# Patient Record
Sex: Male | Born: 1965 | Race: Black or African American | Hispanic: No | Marital: Married | Smoking: Never smoker
Health system: Southern US, Community
[De-identification: ages and names within clinical notes are randomized; demographics above are authoritative.]

## PROBLEM LIST (undated history)

## (undated) DIAGNOSIS — F1010 Alcohol abuse, uncomplicated: Secondary | ICD-10-CM

## (undated) DIAGNOSIS — N19 Unspecified kidney failure: Secondary | ICD-10-CM

## (undated) DIAGNOSIS — E210 Primary hyperparathyroidism: Secondary | ICD-10-CM

## (undated) DIAGNOSIS — I719 Aortic aneurysm of unspecified site, without rupture: Secondary | ICD-10-CM

## (undated) DIAGNOSIS — E041 Nontoxic single thyroid nodule: Secondary | ICD-10-CM

## (undated) DIAGNOSIS — I10 Essential (primary) hypertension: Secondary | ICD-10-CM

## (undated) HISTORY — PX: CARDIAC SURGERY: SHX584

## (undated) HISTORY — DX: Essential (primary) hypertension: I10

## (undated) HISTORY — DX: Aortic aneurysm of unspecified site, without rupture: I71.9

## (undated) HISTORY — PX: THROAT SURGERY: SHX803

---

## 2008-03-20 DIAGNOSIS — I719 Aortic aneurysm of unspecified site, without rupture: Secondary | ICD-10-CM

## 2008-03-20 HISTORY — DX: Aortic aneurysm of unspecified site, without rupture: I71.9

## 2008-06-18 ENCOUNTER — Encounter: Admission: RE | Admit: 2008-06-18 | Discharge: 2008-06-18 | Payer: Self-pay | Admitting: Internal Medicine

## 2008-07-02 ENCOUNTER — Encounter: Admission: RE | Admit: 2008-07-02 | Discharge: 2008-07-02 | Payer: Self-pay | Admitting: Internal Medicine

## 2008-07-02 ENCOUNTER — Other Ambulatory Visit: Admission: RE | Admit: 2008-07-02 | Discharge: 2008-07-02 | Payer: Self-pay | Admitting: Interventional Radiology

## 2008-07-02 ENCOUNTER — Encounter (INDEPENDENT_AMBULATORY_CARE_PROVIDER_SITE_OTHER): Payer: Self-pay | Admitting: Interventional Radiology

## 2008-07-29 ENCOUNTER — Encounter (HOSPITAL_COMMUNITY): Admission: RE | Admit: 2008-07-29 | Discharge: 2008-10-21 | Payer: Self-pay | Admitting: Internal Medicine

## 2008-08-18 ENCOUNTER — Encounter (INDEPENDENT_AMBULATORY_CARE_PROVIDER_SITE_OTHER): Payer: Self-pay | Admitting: Interventional Radiology

## 2008-08-18 ENCOUNTER — Other Ambulatory Visit: Admission: RE | Admit: 2008-08-18 | Discharge: 2008-08-18 | Payer: Self-pay | Admitting: Interventional Radiology

## 2008-08-18 ENCOUNTER — Encounter: Admission: RE | Admit: 2008-08-18 | Discharge: 2008-08-18 | Payer: Self-pay | Admitting: Internal Medicine

## 2008-10-26 ENCOUNTER — Encounter: Admission: RE | Admit: 2008-10-26 | Discharge: 2008-10-26 | Payer: Self-pay | Admitting: Surgery

## 2008-10-30 ENCOUNTER — Encounter: Admission: RE | Admit: 2008-10-30 | Discharge: 2008-10-30 | Payer: Self-pay | Admitting: Surgery

## 2008-11-03 ENCOUNTER — Ambulatory Visit: Payer: Self-pay | Admitting: Surgery

## 2008-11-05 ENCOUNTER — Encounter: Payer: Self-pay | Admitting: Surgery

## 2008-11-09 ENCOUNTER — Ambulatory Visit: Payer: Self-pay | Admitting: Surgery

## 2008-11-12 ENCOUNTER — Inpatient Hospital Stay (HOSPITAL_COMMUNITY): Admission: RE | Admit: 2008-11-12 | Discharge: 2008-11-19 | Payer: Self-pay | Admitting: Surgery

## 2008-11-12 ENCOUNTER — Encounter: Payer: Self-pay | Admitting: Surgery

## 2008-11-12 ENCOUNTER — Ambulatory Visit: Payer: Self-pay | Admitting: Surgery

## 2008-11-18 ENCOUNTER — Encounter: Payer: Self-pay | Admitting: Internal Medicine

## 2008-11-30 ENCOUNTER — Emergency Department (HOSPITAL_COMMUNITY): Admission: EM | Admit: 2008-11-30 | Discharge: 2008-11-30 | Payer: Self-pay | Admitting: Emergency Medicine

## 2008-12-07 ENCOUNTER — Ambulatory Visit: Payer: Self-pay | Admitting: Thoracic Surgery (Cardiothoracic Vascular Surgery)

## 2008-12-07 ENCOUNTER — Encounter
Admission: RE | Admit: 2008-12-07 | Discharge: 2008-12-07 | Payer: Self-pay | Admitting: Thoracic Surgery (Cardiothoracic Vascular Surgery)

## 2008-12-15 ENCOUNTER — Ambulatory Visit: Payer: Self-pay | Admitting: Surgery

## 2008-12-15 ENCOUNTER — Encounter: Admission: RE | Admit: 2008-12-15 | Discharge: 2008-12-15 | Payer: Self-pay | Admitting: Surgery

## 2009-01-12 ENCOUNTER — Encounter: Admission: RE | Admit: 2009-01-12 | Discharge: 2009-01-12 | Payer: Self-pay | Admitting: Surgery

## 2009-01-15 ENCOUNTER — Ambulatory Visit: Payer: Self-pay | Admitting: Surgery

## 2009-03-08 ENCOUNTER — Encounter (INDEPENDENT_AMBULATORY_CARE_PROVIDER_SITE_OTHER): Payer: Self-pay | Admitting: Otolaryngology

## 2009-03-08 ENCOUNTER — Ambulatory Visit (HOSPITAL_COMMUNITY): Admission: RE | Admit: 2009-03-08 | Discharge: 2009-03-09 | Payer: Self-pay | Admitting: Otolaryngology

## 2010-03-25 IMAGING — CR DG CHEST 1V PORT
1 series · 1 of 1 positions shown · non-contrast
Comparison: 11/13/2008

CLINICAL DATA: Chest tubes, ascending aortic aneurysm and
dissection.

PORTABLE CHEST - 1 VIEW

[view not recorded]
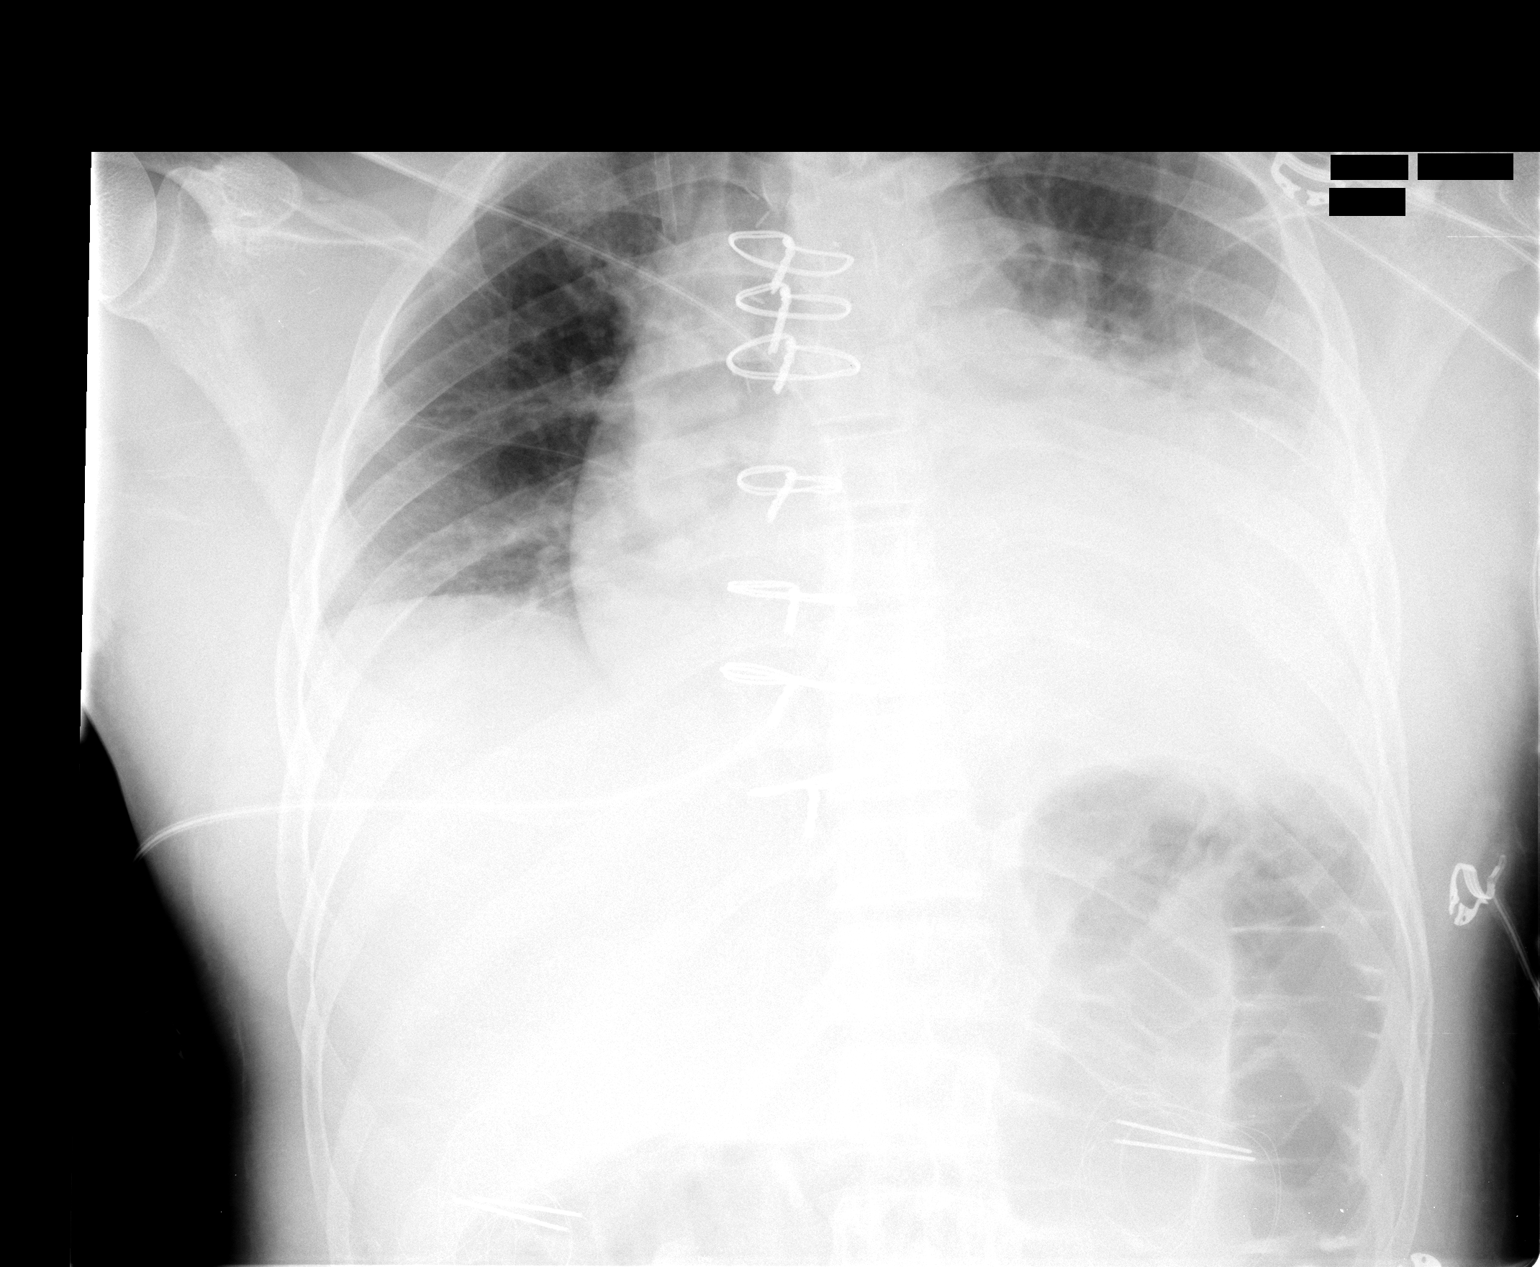

[1 of 1 positions shown; findings below may reference images not displayed]

FINDINGS: Trachea is midline.  Heart is enlarged.  Right IJ
catheter sheath is in place.  Sternotomy wires are unchanged in
position.

Lingular and left lower lobe air space consolidation is new.  Lungs
are low in volume.  Mild bibasilar atelectasis. There may be a left
pleural effusion.
IMPRESSION: 1.  New lingular and left lower lobe air space consolidation.
There may be a left pleural effusion as well.
2.  Minimal right basilar atelectasis.

## 2010-03-28 IMAGING — CR DG CHEST 2V
2 series · 2 of 2 positions shown · non-contrast
Comparison: [HOSPITAL] at [REDACTED] [HOSPITAL] chest CT
10/30/2008, [HOSPITAL] chest x-rays 11/10/2008,
11/12/2008, 11/13/2008, 11/14/2008, and 11/16/2008.

CLINICAL DATA: Ascending aortic aneurysm and dissection.  RT[place]OTA.

CHEST - 2 VIEW

[w chest pa]
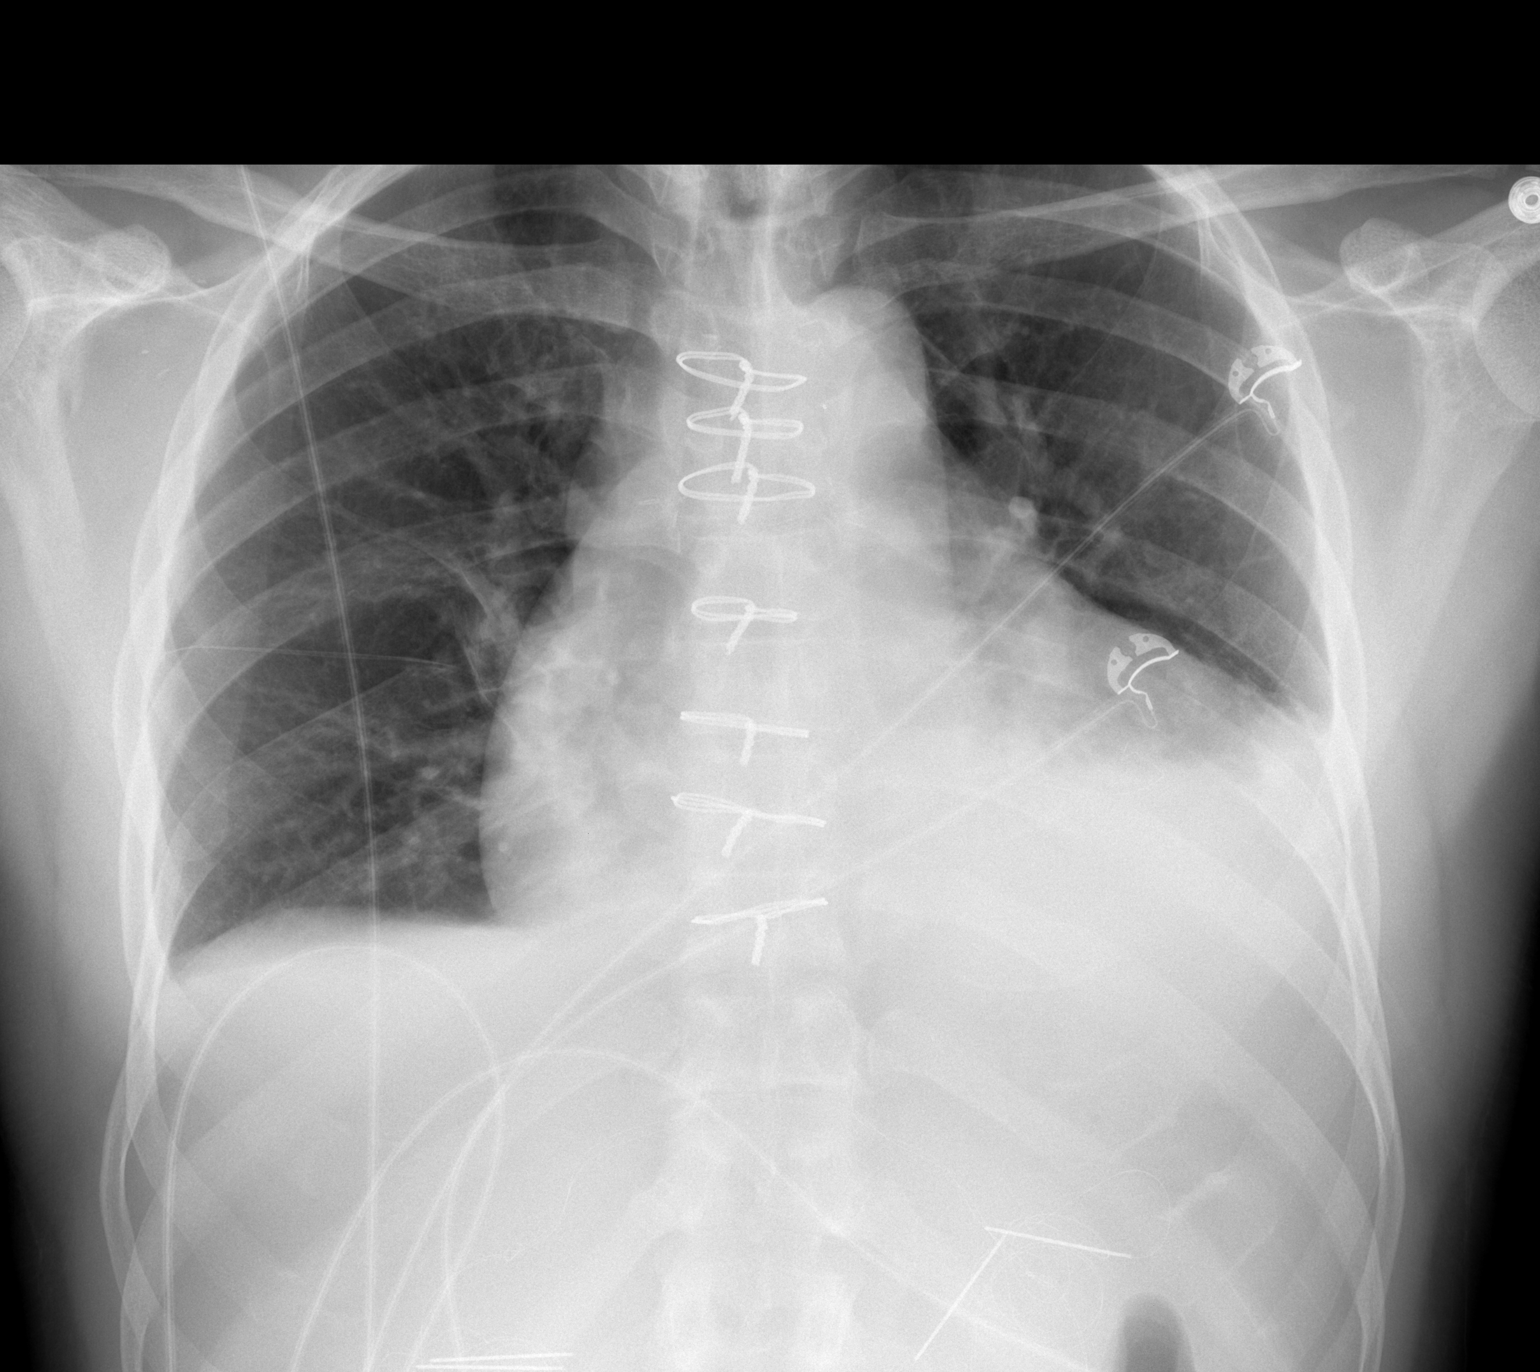

[w chest lat]
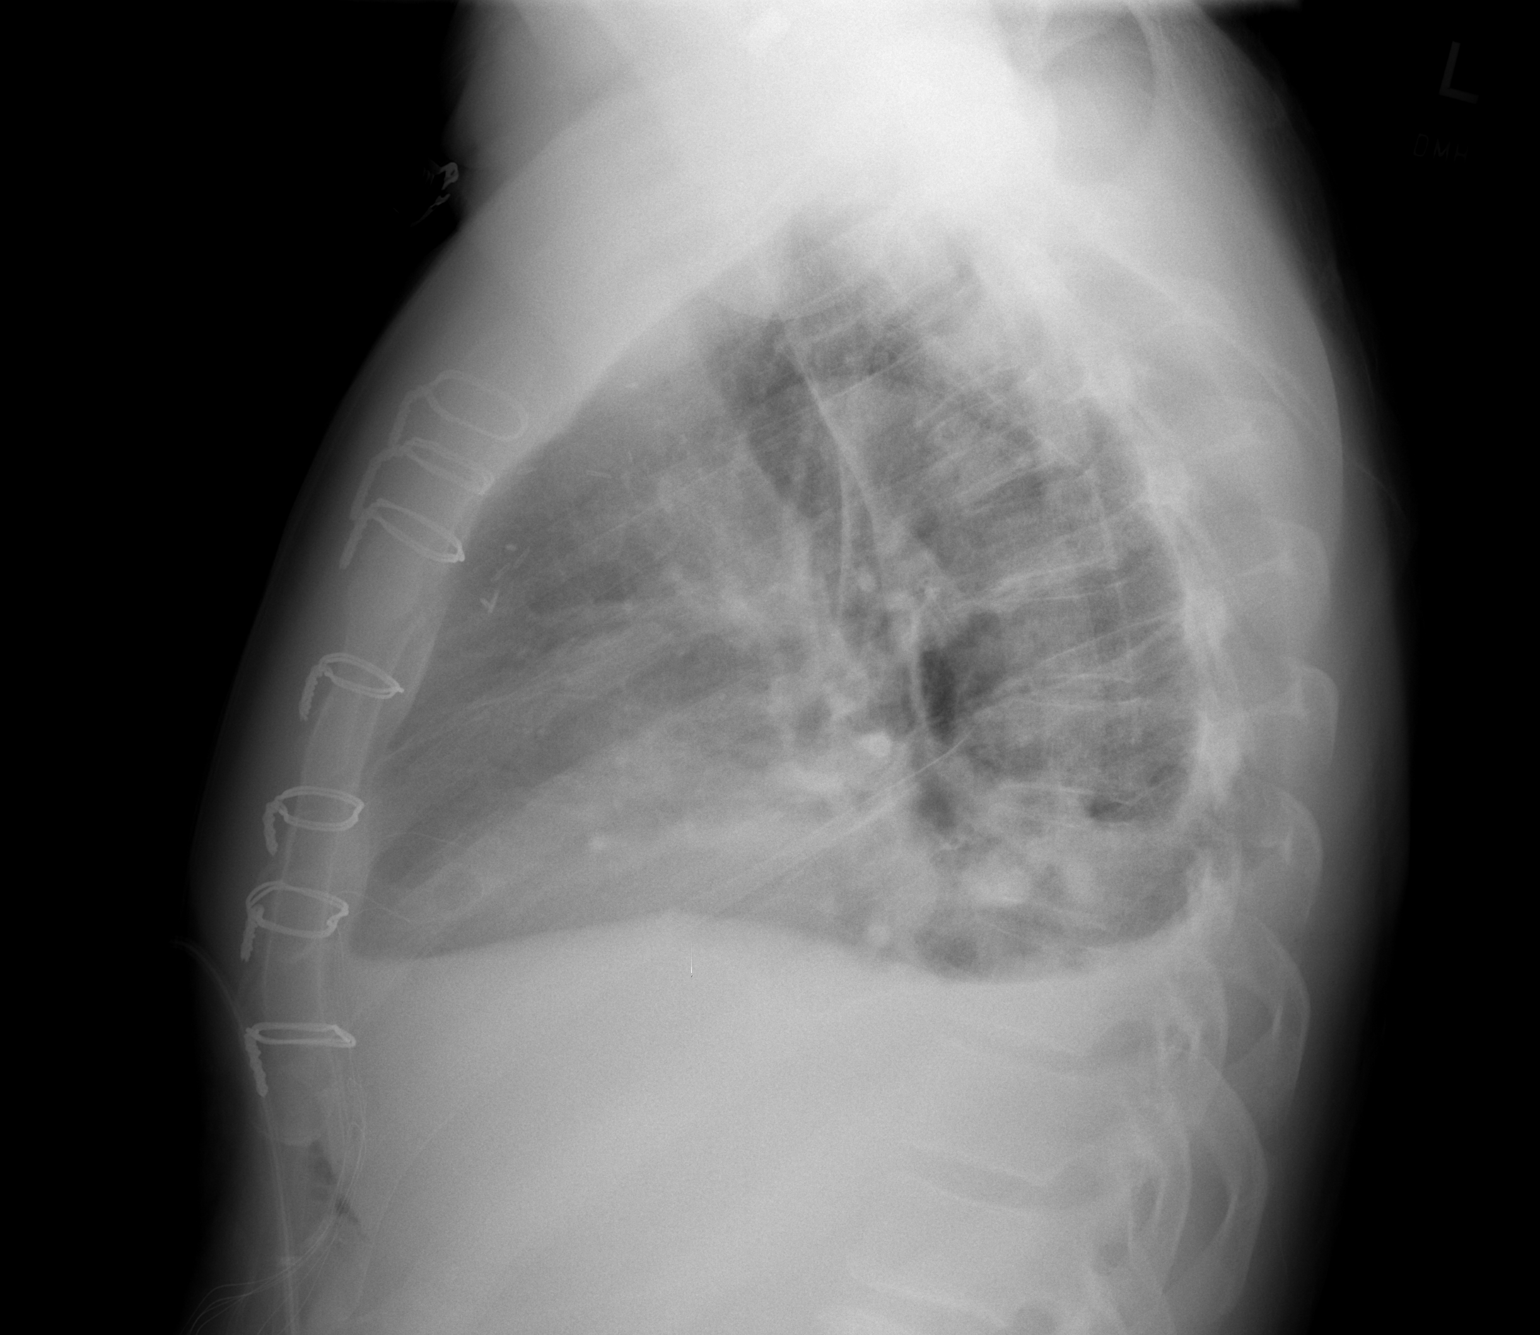

[2 of 2 positions shown; findings below may reference images not displayed]

FINDINGS: Interval removal right internal jugular central venous
sheath without pneumothorax seen.  Greater aeration is seen since
11/16/2008 as well.  No change in small left and tiny right pleural
effusions with left basilar atelectatic/infiltrative change.  Upper
lungs are otherwise clear.  Stable cardiomegaly post sternal wire
sutures visualized.
IMPRESSION: 1.  Small left with tiny right pleural effusions.
2.  Persistent left basilar atelectatic/infiltrative change.
3.  Stable cardiomegaly and sternotomy.
4.  No interval acute findings or pneumothorax.  Interval removal
right internal jugular central venous sheath.

## 2010-04-10 ENCOUNTER — Encounter: Payer: Self-pay | Admitting: Internal Medicine

## 2010-04-17 IMAGING — CR DG CHEST 2V
2 series · 2 of 2 positions shown · non-contrast
Comparison: 11/18/2008. The

CLINICAL DATA: Post repair of thoracic aneurysm.

CHEST - 2 VIEW

[w chest pa]
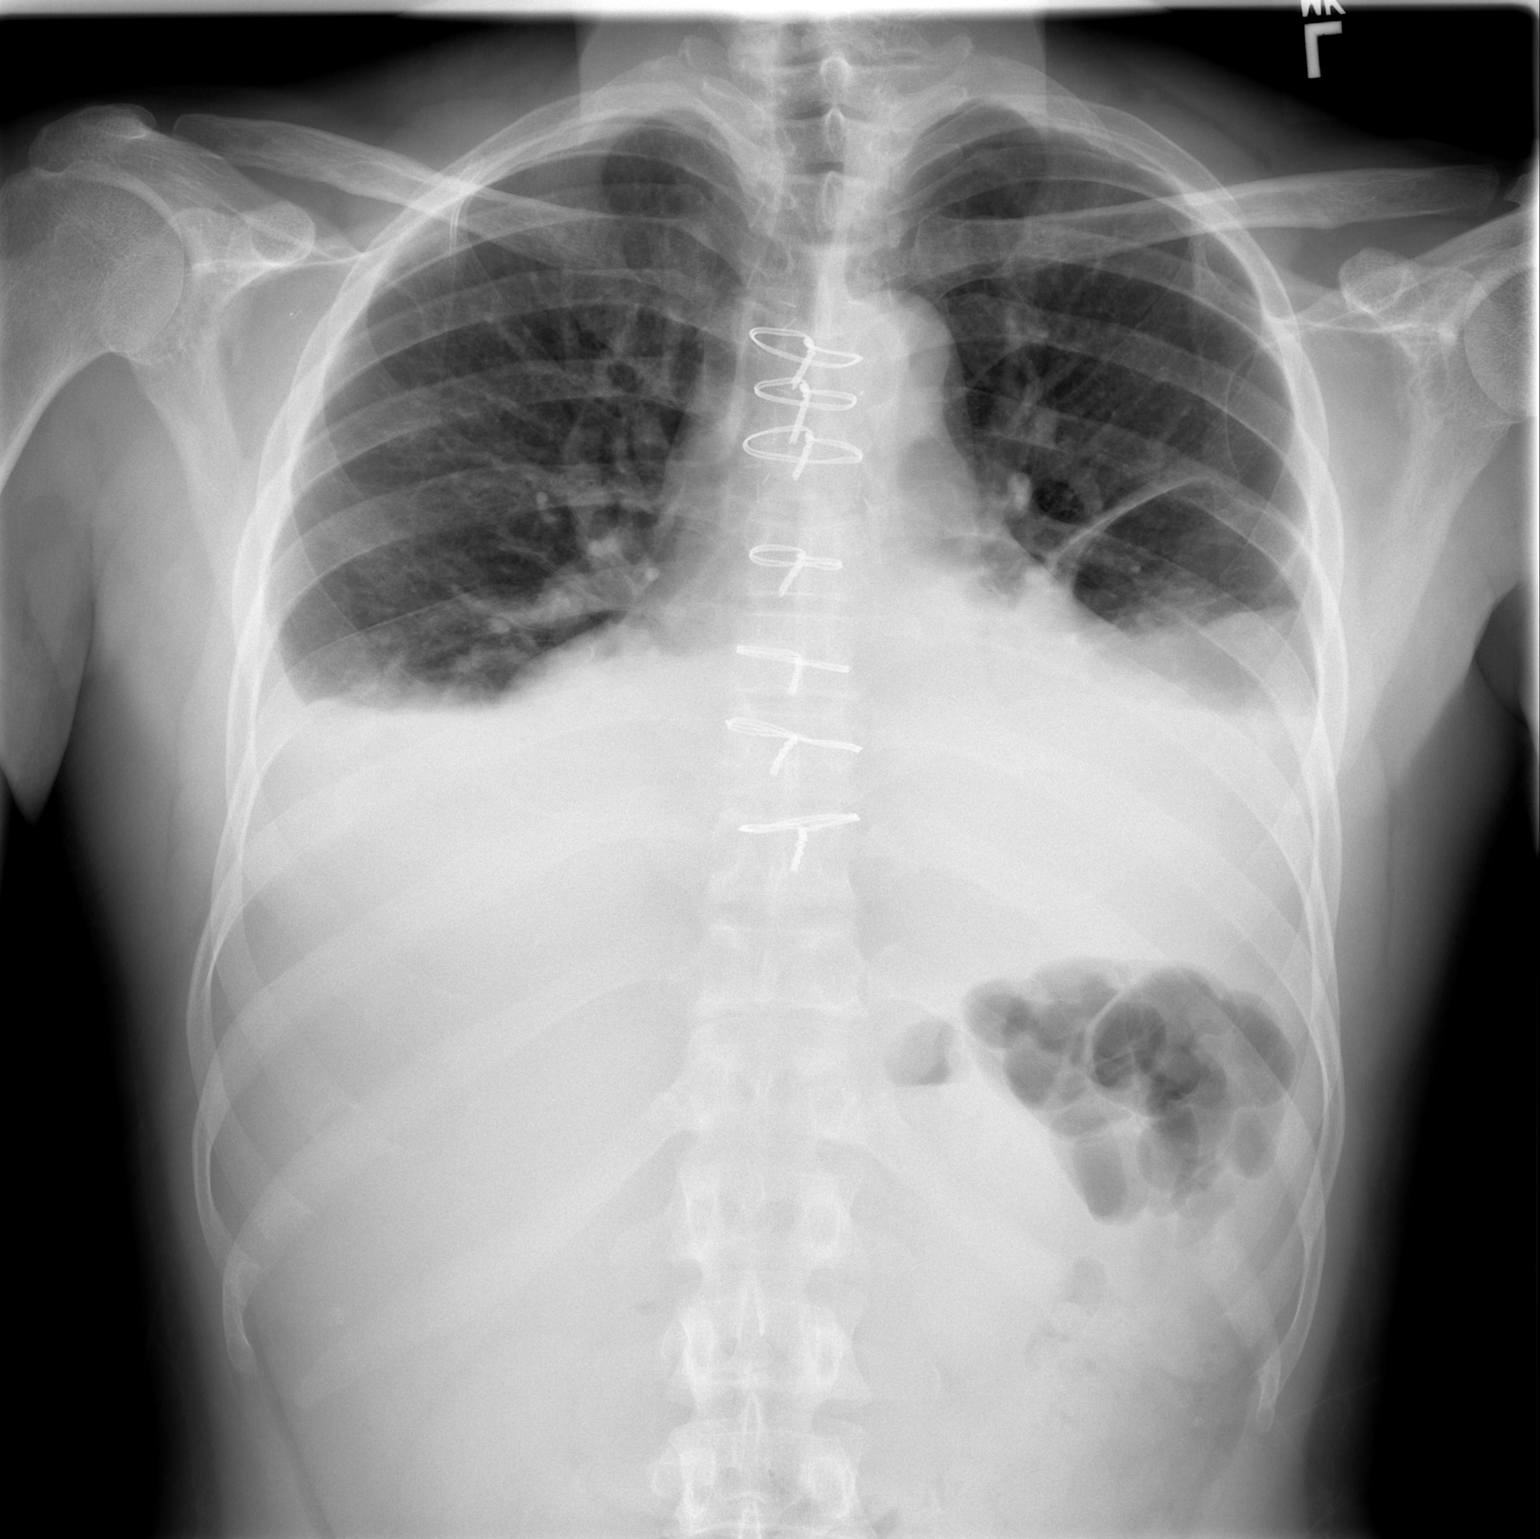

[w chest lat]
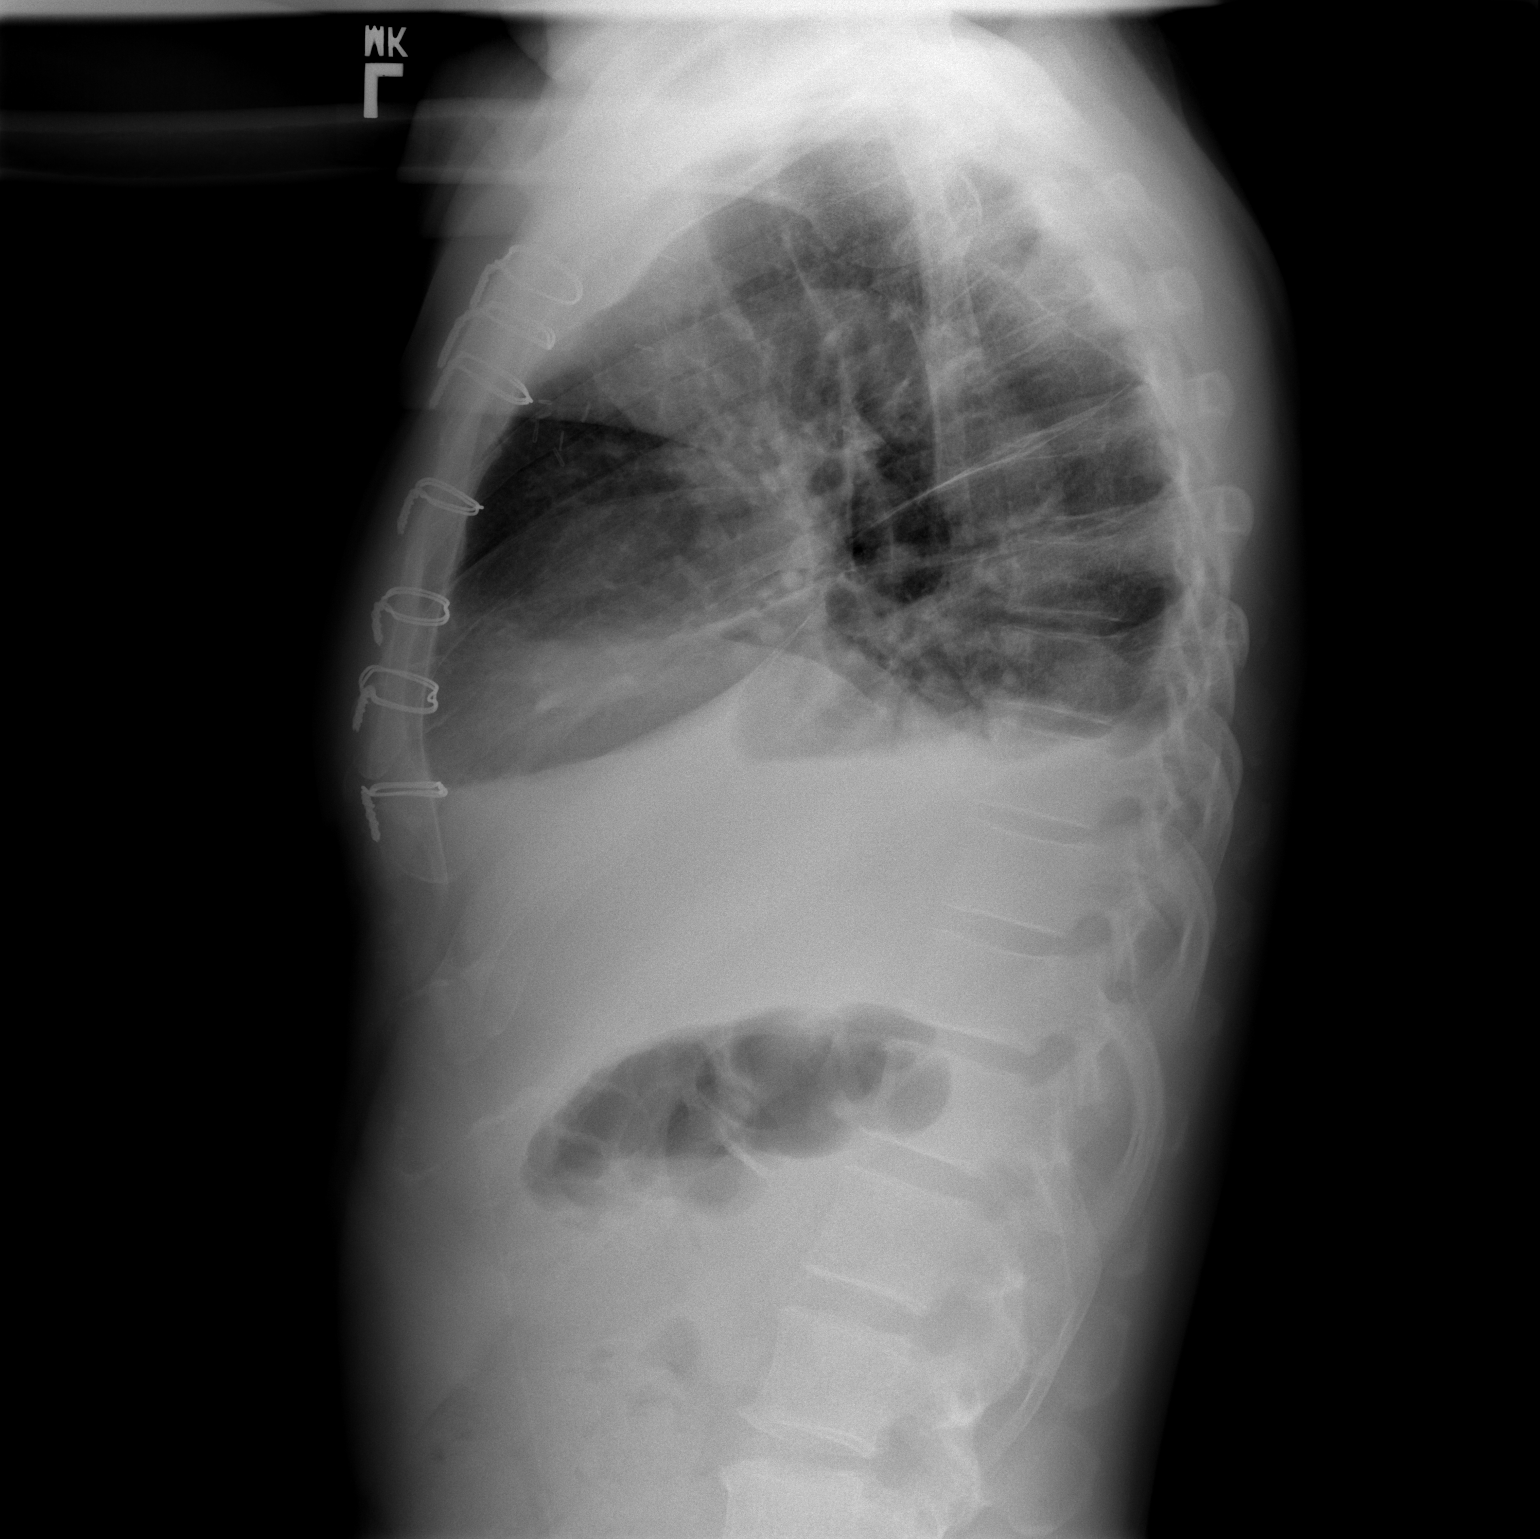

[2 of 2 positions shown; findings below may reference images not displayed]

FINDINGS: New right pleural effusion.  Left pleural effusion about
the same.  There may be an element of venous hypertension but there
appears to be no frank congestive heart failure. Cannot exclude
bilateral lower lobe airspace disease but this may be passive
atelectasis due to the presence of pleural fluid.
IMPRESSION: .
1.  Persistent left pleural effusion.
2.  New right pleural effusion.
3.  Cardiomegaly with perhaps mild venous hypertension - no frank
congestive heart failure.

## 2010-06-20 LAB — CBC
HCT: 40 % (ref 39.0–52.0)
MCHC: 33.7 g/dL (ref 30.0–36.0)
MCV: 84.8 fL (ref 78.0–100.0)
RBC: 4.72 MIL/uL (ref 4.22–5.81)
WBC: 5.8 10*3/uL (ref 4.0–10.5)

## 2010-06-20 LAB — BASIC METABOLIC PANEL
CO2: 30 mEq/L (ref 19–32)
Chloride: 104 mEq/L (ref 96–112)
Creatinine, Ser: 1.35 mg/dL (ref 0.4–1.5)
GFR calc Af Amer: >60 mL/min (ref >60)
Potassium: 4 mEq/L (ref 3.5–5.1)

## 2010-06-24 LAB — BASIC METABOLIC PANEL
BUN: 26 mg/dL — ABNORMAL HIGH (ref 6–23)
CO2: 24 mEq/L (ref 19–32)
Calcium: 10.2 mg/dL (ref 8.4–10.5)
Chloride: 101 mEq/L (ref 96–112)
Creatinine, Ser: 1.62 mg/dL — ABNORMAL HIGH (ref 0.4–1.5)
GFR calc Af Amer: 56 mL/min — ABNORMAL LOW (ref >60)
Glucose, Bld: 118 mg/dL — ABNORMAL HIGH (ref 70–99)

## 2010-06-24 LAB — CBC
MCHC: 34.7 g/dL (ref 30.0–36.0)
MCV: 90.4 fL (ref 78.0–100.0)
Platelets: 136 10*3/uL — ABNORMAL LOW (ref 150–400)
RBC: 2.93 MIL/uL — ABNORMAL LOW (ref 4.22–5.81)
RDW: 13.9 % (ref 11.5–15.5)

## 2010-06-25 LAB — CBC
HCT: 27.1 % — ABNORMAL LOW (ref 39.0–52.0)
HCT: 27.6 % — ABNORMAL LOW (ref 39.0–52.0)
HCT: 28 % — ABNORMAL LOW (ref 39.0–52.0)
HCT: 29.5 % — ABNORMAL LOW (ref 39.0–52.0)
HCT: 42 % (ref 39.0–52.0)
Hemoglobin: 10.4 g/dL — ABNORMAL LOW (ref 13.0–17.0)
Hemoglobin: 9.1 g/dL — ABNORMAL LOW (ref 13.0–17.0)
Hemoglobin: 9.6 g/dL — ABNORMAL LOW (ref 13.0–17.0)
MCHC: 34 g/dL (ref 30.0–36.0)
MCHC: 34.2 g/dL (ref 30.0–36.0)
MCHC: 34.3 g/dL (ref 30.0–36.0)
MCV: 89.1 fL (ref 78.0–100.0)
MCV: 89.4 fL (ref 78.0–100.0)
MCV: 89.8 fL (ref 78.0–100.0)
Platelets: 103 10*3/uL — ABNORMAL LOW (ref 150–400)
Platelets: 126 10*3/uL — ABNORMAL LOW (ref 150–400)
Platelets: 140 10*3/uL — ABNORMAL LOW (ref 150–400)
Platelets: 63 10*3/uL — ABNORMAL LOW (ref 150–400)
Platelets: 88 10*3/uL — ABNORMAL LOW (ref 150–400)
Platelets: 89 10*3/uL — ABNORMAL LOW (ref 150–400)
Platelets: 97 10*3/uL — ABNORMAL LOW (ref 150–400)
RBC: 3.01 MIL/uL — ABNORMAL LOW (ref 4.22–5.81)
RBC: 3.36 MIL/uL — ABNORMAL LOW (ref 4.22–5.81)
RBC: 4.79 MIL/uL (ref 4.22–5.81)
RDW: 13.5 % (ref 11.5–15.5)
RDW: 13.7 % (ref 11.5–15.5)
RDW: 14.3 % (ref 11.5–15.5)
RDW: 14.6 % (ref 11.5–15.5)
RDW: 14.6 % (ref 11.5–15.5)
RDW: 14.8 % (ref 11.5–15.5)
WBC: 11.7 10*3/uL — ABNORMAL HIGH (ref 4.0–10.5)
WBC: 12.1 10*3/uL — ABNORMAL HIGH (ref 4.0–10.5)
WBC: 13.1 10*3/uL — ABNORMAL HIGH (ref 4.0–10.5)
WBC: 15.2 10*3/uL — ABNORMAL HIGH (ref 4.0–10.5)
WBC: 16.2 10*3/uL — ABNORMAL HIGH (ref 4.0–10.5)
WBC: 16.6 10*3/uL — ABNORMAL HIGH (ref 4.0–10.5)
WBC: 18.1 10*3/uL — ABNORMAL HIGH (ref 4.0–10.5)
WBC: 5.3 10*3/uL (ref 4.0–10.5)
WBC: 9.1 10*3/uL (ref 4.0–10.5)

## 2010-06-25 LAB — URINE CULTURE

## 2010-06-25 LAB — BLOOD GAS, ARTERIAL
Acid-Base Excess: 2.9 mmol/L — ABNORMAL HIGH (ref 0.0–2.0)
TCO2: 28.3 mmol/L (ref 0–100)
pCO2 arterial: 42.2 mmHg (ref 35.0–45.0)
pH, Arterial: 7.423 (ref 7.350–7.450)
pO2, Arterial: 85.3 mmHg (ref 80.0–100.0)

## 2010-06-25 LAB — GLUCOSE, CAPILLARY
Glucose-Capillary: 108 mg/dL — ABNORMAL HIGH (ref 70–99)
Glucose-Capillary: 116 mg/dL — ABNORMAL HIGH (ref 70–99)
Glucose-Capillary: 120 mg/dL — ABNORMAL HIGH (ref 70–99)
Glucose-Capillary: 120 mg/dL — ABNORMAL HIGH (ref 70–99)
Glucose-Capillary: 126 mg/dL — ABNORMAL HIGH (ref 70–99)
Glucose-Capillary: 147 mg/dL — ABNORMAL HIGH (ref 70–99)
Glucose-Capillary: 147 mg/dL — ABNORMAL HIGH (ref 70–99)
Glucose-Capillary: 148 mg/dL — ABNORMAL HIGH (ref 70–99)
Glucose-Capillary: 148 mg/dL — ABNORMAL HIGH (ref 70–99)
Glucose-Capillary: 224 mg/dL — ABNORMAL HIGH (ref 70–99)
Glucose-Capillary: 272 mg/dL — ABNORMAL HIGH (ref 70–99)
Glucose-Capillary: 90 mg/dL (ref 70–99)

## 2010-06-25 LAB — POCT I-STAT 3, ART BLOOD GAS (G3+)
Acid-Base Excess: 1 mmol/L (ref 0.0–2.0)
Acid-Base Excess: 2 mmol/L (ref 0.0–2.0)
Acid-base deficit: 1 mmol/L (ref 0.0–2.0)
Acid-base deficit: 1 mmol/L (ref 0.0–2.0)
Acid-base deficit: 3 mmol/L — ABNORMAL HIGH (ref 0.0–2.0)
Bicarbonate: 22.7 mEq/L (ref 20.0–24.0)
Bicarbonate: 24.3 mEq/L — ABNORMAL HIGH (ref 20.0–24.0)
Bicarbonate: 26.5 mEq/L — ABNORMAL HIGH (ref 20.0–24.0)
Bicarbonate: 27 mEq/L — ABNORMAL HIGH (ref 20.0–24.0)
O2 Saturation: 100 %
O2 Saturation: 100 %
O2 Saturation: 100 %
Patient temperature: 36.6
Patient temperature: 37
TCO2: 25 mmol/L (ref 0–100)
TCO2: 27 mmol/L (ref 0–100)
TCO2: 28 mmol/L (ref 0–100)
pCO2 arterial: 34.9 mmHg — ABNORMAL LOW (ref 35.0–45.0)
pCO2 arterial: 39.9 mmHg (ref 35.0–45.0)
pH, Arterial: 7.353 (ref 7.350–7.450)
pH, Arterial: 7.372 (ref 7.350–7.450)
pH, Arterial: 7.412 (ref 7.350–7.450)
pH, Arterial: 7.422 (ref 7.350–7.450)
pO2, Arterial: 225 mmHg — ABNORMAL HIGH (ref 80.0–100.0)
pO2, Arterial: 304 mmHg — ABNORMAL HIGH (ref 80.0–100.0)
pO2, Arterial: 378 mmHg — ABNORMAL HIGH (ref 80.0–100.0)
pO2, Arterial: 96 mmHg (ref 80.0–100.0)

## 2010-06-25 LAB — URINALYSIS, ROUTINE W REFLEX MICROSCOPIC
Glucose, UA: NEGATIVE mg/dL
Ketones, ur: NEGATIVE mg/dL
Leukocytes, UA: NEGATIVE
Nitrite: NEGATIVE
Protein, ur: 30 mg/dL — AB
Urobilinogen, UA: 1 mg/dL (ref 0.0–1.0)

## 2010-06-25 LAB — BASIC METABOLIC PANEL
BUN: 20 mg/dL (ref 6–23)
BUN: 22 mg/dL (ref 6–23)
BUN: 22 mg/dL (ref 6–23)
BUN: 27 mg/dL — ABNORMAL HIGH (ref 6–23)
CO2: 26 mEq/L (ref 19–32)
Calcium: 9.3 mg/dL (ref 8.4–10.5)
Calcium: 9.9 mg/dL (ref 8.4–10.5)
Chloride: 101 mEq/L (ref 96–112)
Chloride: 104 mEq/L (ref 96–112)
Chloride: 105 mEq/L (ref 96–112)
Creatinine, Ser: 1.56 mg/dL — ABNORMAL HIGH (ref 0.4–1.5)
Creatinine, Ser: 1.68 mg/dL — ABNORMAL HIGH (ref 0.4–1.5)
Creatinine, Ser: 1.76 mg/dL — ABNORMAL HIGH (ref 0.4–1.5)
Creatinine, Ser: 2.02 mg/dL — ABNORMAL HIGH (ref 0.4–1.5)
GFR calc Af Amer: 51 mL/min — ABNORMAL LOW (ref >60)
GFR calc non Af Amer: 39 mL/min — ABNORMAL LOW (ref >60)
GFR calc non Af Amer: 42 mL/min — ABNORMAL LOW (ref >60)
Glucose, Bld: 103 mg/dL — ABNORMAL HIGH (ref 70–99)
Glucose, Bld: 143 mg/dL — ABNORMAL HIGH (ref 70–99)
Glucose, Bld: 240 mg/dL — ABNORMAL HIGH (ref 70–99)
Potassium: 3.6 mEq/L (ref 3.5–5.1)
Potassium: 3.9 mEq/L (ref 3.5–5.1)
Potassium: 3.9 mEq/L (ref 3.5–5.1)
Sodium: 134 mEq/L — ABNORMAL LOW (ref 135–145)
Sodium: 134 mEq/L — ABNORMAL LOW (ref 135–145)

## 2010-06-25 LAB — TYPE AND SCREEN
ABO/RH(D): A POS
Antibody Screen: NEGATIVE

## 2010-06-25 LAB — CREATININE, SERUM
Creatinine, Ser: 1.92 mg/dL — ABNORMAL HIGH (ref 0.4–1.5)
GFR calc Af Amer: 46 mL/min — ABNORMAL LOW (ref >60)
GFR calc Af Amer: 54 mL/min — ABNORMAL LOW (ref >60)
GFR calc non Af Amer: 38 mL/min — ABNORMAL LOW (ref >60)
GFR calc non Af Amer: 45 mL/min — ABNORMAL LOW (ref >60)

## 2010-06-25 LAB — POCT I-STAT, CHEM 8
Calcium, Ion: 1.31 mmol/L (ref 1.12–1.32)
HCT: 29 % — ABNORMAL LOW (ref 39.0–52.0)
TCO2: 22 mmol/L (ref 0–100)

## 2010-06-25 LAB — URINALYSIS, MICROSCOPIC ONLY
Bilirubin Urine: NEGATIVE
Glucose, UA: NEGATIVE mg/dL
Leukocytes, UA: NEGATIVE
Nitrite: NEGATIVE
Specific Gravity, Urine: 1.02 (ref 1.005–1.030)
pH: 5.5 (ref 5.0–8.0)

## 2010-06-25 LAB — POCT I-STAT 4, (NA,K, GLUC, HGB,HCT)
Glucose, Bld: 109 mg/dL — ABNORMAL HIGH (ref 70–99)
Glucose, Bld: 113 mg/dL — ABNORMAL HIGH (ref 70–99)
Glucose, Bld: 120 mg/dL — ABNORMAL HIGH (ref 70–99)
Glucose, Bld: 127 mg/dL — ABNORMAL HIGH (ref 70–99)
Glucose, Bld: 144 mg/dL — ABNORMAL HIGH (ref 70–99)
Glucose, Bld: 99 mg/dL (ref 70–99)
Glucose, Bld: 99 mg/dL (ref 70–99)
HCT: 26 % — ABNORMAL LOW (ref 39.0–52.0)
HCT: 31 % — ABNORMAL LOW (ref 39.0–52.0)
HCT: 34 % — ABNORMAL LOW (ref 39.0–52.0)
Hemoglobin: 8.2 g/dL — ABNORMAL LOW (ref 13.0–17.0)
Hemoglobin: 8.8 g/dL — ABNORMAL LOW (ref 13.0–17.0)
Hemoglobin: 9.5 g/dL — ABNORMAL LOW (ref 13.0–17.0)
Potassium: 2.8 mEq/L — ABNORMAL LOW (ref 3.5–5.1)
Potassium: 2.9 mEq/L — ABNORMAL LOW (ref 3.5–5.1)
Potassium: 3 mEq/L — ABNORMAL LOW (ref 3.5–5.1)
Potassium: 3.1 mEq/L — ABNORMAL LOW (ref 3.5–5.1)
Potassium: 3.3 mEq/L — ABNORMAL LOW (ref 3.5–5.1)
Potassium: 3.4 mEq/L — ABNORMAL LOW (ref 3.5–5.1)
Sodium: 129 mEq/L — ABNORMAL LOW (ref 135–145)
Sodium: 130 mEq/L — ABNORMAL LOW (ref 135–145)
Sodium: 131 mEq/L — ABNORMAL LOW (ref 135–145)
Sodium: 131 mEq/L — ABNORMAL LOW (ref 135–145)
Sodium: 131 mEq/L — ABNORMAL LOW (ref 135–145)

## 2010-06-25 LAB — POCT I-STAT 3, VENOUS BLOOD GAS (G3P V)
Acid-base deficit: 1 mmol/L (ref 0.0–2.0)
Acid-base deficit: 2 mmol/L (ref 0.0–2.0)
Bicarbonate: 24.4 mEq/L — ABNORMAL HIGH (ref 20.0–24.0)
O2 Saturation: 89 %
TCO2: 26 mmol/L (ref 0–100)
pO2, Ven: 58 mmHg — ABNORMAL HIGH (ref 30.0–45.0)
pO2, Ven: 71 mmHg — ABNORMAL HIGH (ref 30.0–45.0)

## 2010-06-25 LAB — COMPREHENSIVE METABOLIC PANEL
AST: 30 U/L (ref 0–37)
Albumin: 3.8 g/dL (ref 3.5–5.2)
Alkaline Phosphatase: 58 U/L (ref 39–117)
CO2: 29 mEq/L (ref 19–32)
Chloride: 103 mEq/L (ref 96–112)
GFR calc Af Amer: 51 mL/min — ABNORMAL LOW (ref >60)
GFR calc non Af Amer: 42 mL/min — ABNORMAL LOW (ref >60)
Potassium: 3.3 mEq/L — ABNORMAL LOW (ref 3.5–5.1)
Total Bilirubin: 0.5 mg/dL (ref 0.3–1.2)

## 2010-06-25 LAB — URINE MICROSCOPIC-ADD ON

## 2010-06-25 LAB — HEMOGLOBIN AND HEMATOCRIT, BLOOD
HCT: 31.4 % — ABNORMAL LOW (ref 39.0–52.0)
Hemoglobin: 10.9 g/dL — ABNORMAL LOW (ref 13.0–17.0)

## 2010-06-25 LAB — PREPARE PLATELETS

## 2010-06-25 LAB — PROTIME-INR: INR: 1.6 — ABNORMAL HIGH (ref 0.00–1.49)

## 2010-06-25 LAB — ABO/RH: ABO/RH(D): A POS

## 2010-06-25 LAB — MAGNESIUM
Magnesium: 2.4 mg/dL (ref 1.5–2.5)
Magnesium: 2.5 mg/dL (ref 1.5–2.5)
Magnesium: 2.6 mg/dL — ABNORMAL HIGH (ref 1.5–2.5)

## 2010-06-25 LAB — POCT I-STAT GLUCOSE
Glucose, Bld: 114 mg/dL — ABNORMAL HIGH (ref 70–99)
Operator id: 140821

## 2010-06-25 LAB — APTT: aPTT: 34 seconds (ref 24–37)

## 2010-08-02 NOTE — Assessment & Plan Note (Signed)
OFFICE VISIT   Colin [name], Colin [name]  DOB:  19-Jun-1965                                        December 15, 2008  CHART #:  [id number]   HISTORY:  The patient returns today for followup status post replacement  of a large ascending aortic arch aneurysm as well as bypass of a large  bovine arterial trunk.  This was performed on November 12, 2008.  His  postoperative course was uncomplicated.  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  This did not cause him any respiratory  compromise.  Since discharge, he said he has been feeling fairly well.  He still has some mild chest wall discomfort and is still using pain  medication.  He is walking daily without any shortness of breath.   PHYSICAL EXAMINATION:  Today, his blood pressure is 129/76, pulse 79 and  regular.  His respiratory rate is 18 and unlabored.  Oxygen saturation  on room air is 96%.  He looks well.  Cardiac exam shows a regular rate  and rhythm with normal bowel sounds.  There is no murmur, rub, or  gallop.  Lung exam shows decreased breath sounds in the bases.  Chest  incision is healing well, and the sternum is stable.  There is no  peripheral edema.   DIAGNOSTICS:  A followup chest x-ray today shows decreased size of his  bilateral pleural effusions and improved aeration of both lung bases.  There is still elevation of his left hemidiaphragm.   MEDICATIONS:  1. Amlodipine 10 mg daily.  2. Lopressor 200 mg q.8 h.  3. Clonidine patch 0.3 mg weekly.  4. Lasix 40 mg daily x14 days.  5. Potassium chloride 20 mEq daily x14 days.  6. Hydrocodone 5/325 q.6 h. p.r.n. for pain.   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  He was placed on Lasix and potassium on December 07, 2008,  due to his bilateral pleural effusion seen on chest x-ray.  He will  complete his current prescription and then discontinue it.  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  Only time will tell if this will improve.  This  does not appear to be causing him any respiratory compromise at this  time.  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  He did have severe hypertension  preoperatively and requiring large doses of blood pressure medication  postoperatively to bring him under adequate control.  He said that he  had a physician or nurse practitioner following him preoperatively, but  he could not remember who that was.  He went down to [facility] this morning and was given a followup appointment to see Dr.  Johnella Moloney around December 27, 2008.  I  think he will require close  followup of his hypertension to prevent long-term sequelae.  I will plan  to see him back in 1 month for followup with a chest x-ray.  He is going  to follow up with Dr. Darnell Level concerning his thyroid nodules and is  going to see Dr. 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   Evelene Croon, M.D.  Electronically Signed   BB/MEDQ  D:  12/15/2008  T:  12/16/2008  Job:  [id number]   cc:   Candyce Churn, M.D.  Kinnie Scales. Annalee Genta, M.D.  Velora Heckler, MD

## 2010-08-02 NOTE — Assessment & Plan Note (Signed)
OFFICE VISIT   DON, TIU  DOB:  Aug 25, 1965                                        January 15, 2009  CHART #:  [id number]   The patient returns today for follow up status post replacement of  ascending aortic and arch aneurysm with bypass to a large bovine  arterial trunk on 11/12/2008.  Postoperatively, he had significant left  lower lobe atelectasis on chest x-ray although, he was asymptomatic.  I  was concerned that he might have some left hemidiaphragm paralysis due  to his surgery.  We have continued to follow this with chest x-ray.  A  followup chest x-ray today shows the left hemidiaphragm in normal  position.  The left lower lobe atelectasis has completely resolved.  Lungs are otherwise clear with no pleural effusions.  He said that he  feels well overall.  He has minimal incisional discomfort.  He has seen  Dr. Johnella Moloney for medical follow up of his blood pressure.   PHYSICAL EXAMINATION:  Vital Signs:  Today, his blood pressure is  144/86, pulse is 74 and regular, his respiratory rate is 18 and  unlabored.  Oxygen saturation on room air is 98%.  General:  He looks  well.  Cardiac:  Regular rate and rhythm with normal heart sounds.  There is no murmur, rub or gallop.  Lungs:  Clear.  Chest:  The chest  incision is healing well and the sternum is stable.  Extremities:  There  is no peripheral edema.   His medications are Norvasc 10 mg daily, Lopressor 200 mg q.8 h.,  clonidine 0.3 mg at bedtime, and hydrocodone p.r.n. for pain.   IMPRESSION:  Overall, the patient is recovering well from his surgery.  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  I advised  him to continue following up with Dr. Kevan Ny for control of his  blood pressure which has required a lot of medication.  He will return  to see me if he develops any problems with his  incisions.   Evelene Croon, M.D.  Electronically Signed   BB/MEDQ  D:  01/15/2009  T:  01/16/2009  Job:  [id number]   cc:   Candyce Churn, M.D.

## 2010-08-02 NOTE — Op Note (Signed)
NAME:  Colin [name], Colin [name] NO.:  [id number]   MEDICAL RECORD NO.:  [id number]          PATIENT TYPE:  INP   LOCATION:  2306                         FACILITY:  [facility]   PHYSICIAN:  Evelene Croon, M.D.     DATE OF BIRTH:  25-Oct-1965   DATE OF PROCEDURE:  11/12/2008  DATE OF DISCHARGE:                               OPERATIVE REPORT   PREOPERATIVE DIAGNOSIS:  A 6.5 cm ascending aortic aneurysm with chronic  type A aortic dissection.   POSTOPERATIVE DIAGNOSIS:  A 6.5 cm ascending aortic aneurysm with  chronic type A aortic dissection.   PROCEDURES:  Median sternotomy, extracorporeal circulation via the right  axillary artery and right atrium, deep hypothermic circulatory arrest,  replacement of ascending aortic and arch aneurysm using a 28-mm  Hemashield tube graft, the ascending aortic graft to bovine arterial  trunk bypass using a 12-mm Hemashield tube graft.   SURGEON:  Evelene Croon, MD   ASSISTANT:  Coral Ceo, [facility]   ANESTHESIA:  General endotracheal.   CLINICAL HISTORY:  This patient is a 45 year old gentleman from [place]  who has been in the [place] for about one and half years.  He has  a long history of hypertension and was recently undergoing evaluation by  Dr. Gerrit Friends for a midline mass in his neck.  An ultrasound of the neck  showed thyroid nodules, which were worked up by Dr. Gerrit Friends and included  biopsy on two occasions showing benign nodules.  The patient  subsequently underwent a CT scan of the neck to workup a suspected  thyroglossal duct cyst causing this midline neck mass.  The CT scan of  the chest incidentally showed a 6.5 or 6.6 cm ascending aortic aneurysm  that started at about the sinotubular junction that extended up into the  proximal arch.  There is also a type A dissection within the aneurysm.  This dissection appeared to begin at about the sinotubular junction and  extended up to the proximal aortic arch.  The patient had a bovine  arterial trunk coming off of the proximal aortic arch that divided into  the innominate and left carotid arteries.  This dissection appeared to  extend up just beyond the origin of this arterial trunk.  The aortic  arch beyond this bovine arterial trunk, as well as the descending aorta  were of normal caliber.  The patient was therefore referred to my office  for surgical treatment.  We obtained an echocardiogram, which showed  normal tricuspid aortic valve with no aortic insufficiency.  The sinus  segment of the aorta appeared to be of normal size.  The dissection did  not appear to involve the segment.  The patient had normal left  ventricular function.  There was no mitral regurgitation.  I felt that  the best treatment would be surgical replacement of his ascending aorta  and the dissected aorta with either resuspension of his aortic valve or  a valve sparing aortic root replacement.  I did not obtain a  preoperative cardiac catheterization since the patient was only 42 and  was a nonsmoker with no family history of heart disease.  I  was also  concerned about trying to manipulate a catheter up into the dissected  aorta especially with a 6.5 cm ascending aortic aneurysm.  I felt the  risk of catheterization outweighed the benefits.  The patient never had  any symptoms of heart disease.  He did relate that he had an episode of  chest pain.  Few weeks before he came in the [place] about a year  and half ago, at which time he was seen in [place] and underwent treatment  for severe high blood pressure.  He said no diagnostic studies were done  at that time.  I suspected that he had a chronic type A dissection.  I  discussed the operative procedure of repairing this problem with him  including alternatives, benefits, and risks including, but not limited  to bleeding, blood transfusion, infection, stroke, myocardial  infarction, heart block requiring permanent pacemaker, future aortic   valve dysfunction, and recurrent aneurysm in the aorta, which was not  replaced.  He understood all this and agreed to proceed.   OPERATIVE PROCEDURE:  The patient was taken into the operating room and  placed on table in supine position.  After induction of general  endotracheal anesthesia, a Foley catheter was placed in bladder using  sterile technique.  Preoperative intravenous antibiotics were given.  Then, the chest, abdomen, and both lower extremities were prepped and  draped in the usual sterile manner.  Transesophageal echocardiogram  confirmed the large ascending aortic aneurysm.  The aortic valve had  three leaflets and there was no aortic stenosis or insufficiency.  The  sinus segment of the aorta appeared to be of normal caliber.  The  aneurysm and the dissection appeared to begin just at the sinotubular  junction.  There is no mitral regurgitation.  Left ventricular function  appeared well preserved.   Then, the chest was opened through a median sternotomy incision and the  pericardium opened in midline.  Examination of the heart showed good  ventricular contractility.  There was a large ascending aortic aneurysm  with chronically thickened wall, which was white in color.  The aneurysm  appeared to extend up to the takeoff of this bovine arterial trunk,  which measured about 12 mm in diameter.   Then, the right axillary artery was exposed through a transverse  incision below the right lateral clavicle.  The clavicular and sternal  heads of the pectoralis major were separated along their natural  cleavage plane and the pectoralis minor was retracted laterally to  expose the axillary artery.  There was excellent exposure of this  artery.  The brachial plexus was identified and carefully avoided.  The  axillary artery was controlled proximally and distally with vessel  loops.  The patient was then heparinized and when an adequate activated  clotting time was achieved,  peripheral DeBakey clamps were used to  occlude the axillary artery proximally and distally.  The artery was  opened longitudinally and an 8-mm Hemashield tube graft was beveled and  sewn to the axillary artery in an end-to-side manner using continuous 4-  0 Prolene suture.  This anastomosis was lightly covered with CoSeal for  hemostasis.  Then, the clamps were removed from the axillary artery and  the tube graft was connected to the arterial end of the bypass circuit.   Then, attention was returned to the chest.  The right atrium was  cannulated using a two-stage venous cannula for the right atrial  appendage.  The left ventricular  vent was placed through the right  superior pulmonary vein.  A retrograde cardioplegic cannula was placed  through a pursestring suture in the right atrium and advanced in the  coronary sinus without difficulty.  The patient was then placed on  cardiopulmonary bypass and cooled to 18 degrees centigrade.  While we  were cooling, the aortic arch was mobilized.  The bovine trunk and its  innominate and left carotid branches were mobilized.  There was at least  2 cm of aorta between the bovine trunk in the left subclavian artery.  Then, an insulating pad was placed in the pericardium and a temperature  probe in the septum.  When the patient had cooled for about 35 minutes  and venous return temperature was at 17 degrees centigrade, bladder and  rectal temperature around 19 degrees centigrade, the patient was placed  in a steep Trendelenburg position.  Then cardiopulmonary bypass was  stopped.  A peripheral DeBakey clamp was placed across the proximal  portion of this bovine trunk and perfusion of the axillary artery was  restarted at about 500 mL per minute to maintain normal arterial blood  pressure within the right radial A-line.  This allowed perfusion of the  brain through both the right and left sides.  Then, the patient was  given 800 mL of cold blood  retrograde cardioplegia.  Myocardial septal  temperature was cooled to about 7 degrees centigrade.  The heart was  coated with ice.  The patient had been given Pentothal and steroids  prior to beginning circulatory arrest and head was packed in ice saline.  The BIS during the procedure was 0.  Then, the aorta was opened proximal  to the takeoff of the innominate artery.  Examination of the aorta  showed that the dissection extended up to a point just at the distal  edge of the bovine arterial trunk.  Beyond this, the aorta appeared to  be of normal caliber.  Therefore, I felt best due to distal anastomosis  in the mid aortic arch proximal to the left subclavian artery and  reattach the bovine arterial trunk to the aortic graft.  Then, the aorta  was transected just beyond the bovine arterial trunk.  The left  recurrent laryngeal nerve was not seen, although I carefully watched for  it.  I suspect it was probably curving around the aorta distal to where  we were anastomosing.  Then, a 28-mm Hemashield tube graft with a 10-mm  sidearm was brought in the operative field and cut to the appropriate  length.  It was anastomosed end-to-end to the mid aortic arch in an end-  to-end manner using continuous 3-0 Prolene suture.  A felt strip was  used to reinforce this anastomosis and a light coating of CoSeal was  applied for hemostasis.  Then, the other arterial line was connected to  the 10-mm sidearm and cardiopulmonary bypass was reinstituted through  both the right axillary artery and this sidearm on the aortic graft.  As  flow was brought up, a crossclamp was placed across the graft just  proximal to the sidearm.  Full cardiopulmonary bypass was reinstituted  to perfuse the entire body.  Total circulatory arrest time was 26  minutes, but throughout the circulatory arrest time, there was perfusion  of the upper body through the innominate and left carotid arteries.   The patient was then  rewarmed towards 28 degrees centigrade.  The aortic  aneurysm was opened proximally.  Examination of more  proximal aorta  showed that the dissection appeared to begin just at the sinotubular  junction.  The aorta at this level appeared to be normal caliber and  thick-walled.  The aortic valve itself appeared normal.  The coronary  ostia appeared normal.  There was an adequate length of normal caliber  aorta above the coronary ostia to allow anastomosis.  Therefore the  aorta was transected at this level above the coronary arteries.  There  was some separation of the aortic wall posteriorly from the proximal  extent of the dissection.  I reapproximated the walls of the chronically  dissected aorta in this location using a piece of felt between the  layers and some BioGlue.  Then, the graft was cut at the appropriate  length and was anastomosed to the proximal aorta in an end-to-end manner  using continuous 3-0 Prolene suture with a felt strip to reinforce the  anastomosis.  This anastomosis was coated with CoSeal for hemostasis.  The patient was then rewarmed to 37 degrees centigrade.  A  McGowan  needle was placed in the ascending aortic graft for de-airing.  The head  was placed in Trendelenburg position and the left side of the heart was  de-aired.  The cross-clamp was then removed with time of 69 minutes.  There was spontaneous return of ventricular fibrillation.  Then, a  partial occlusion clamp was placed across the ascending aortic graft and  an opening was made in the graft longitudinally.  A 12-mm Hemashield  tube graft was then cut through a bevel and was anastomosed to the  ascending aortic graft in an end-to-side manner using continuous 4-0  Prolene suture.  This anastomosis was coated with CoSeal.  Then, the  partial occlusion clamp was removed and a clamp placed across the  proximal portion of this graft.  The 12-mm Hemashield graft was then cut  to the appropriate length.   The bovine arterial trunk was cut back to  normal-appearing tissue and it was then anastomosed to this graft in an  end-to-end manner using continuous 5-0 Prolene suture.  The anastomosis  was performed posterior to the innominate vein.  The graft and branches  were de-aired and the clamps all removed.  The anastomoses appeared  hemostatic.  The patient spontaneously converted to sinus rhythm.  Two  temporary right ventricular and right atrial pacing wires were placed  above the skin.  When the patient had rewarmed to 37 degrees centigrade,  he was weaned from cardiopulmonary bypass on no inotropic agents.  Total  bypass time was 169 minutes.  Transesophageal echocardiogram showed  normal functioning aortic valve with no regurgitation.  There was no  mitral regurgitation.  Left ventricular function appeared well  preserved.  Then, protamine was given and the venous and retrograde  cardioplegic cannula was removed.  After protamine was complete, the  patient was given 2 units of platelets due to his platelet count of  60,000.  The sidearm of the ascending aortic graft was ligated with a  heavy silk tie and then cut and oversewn with a double layer of  continuous 4-0 Prolene suture.  The axillary arterial graft was then  also ligated with a heavy silk tie and divided and oversewn with two  layers of continuous 4-0 Prolene suture.  Hemostasis was achieved  without difficulty.  Two chest tubes were placed with two in the post  pericardium, one anterior mediastinum.  The pericardium was loosely  reapproximated.  Sternum was closed with  double #6 stainless steel  wires.  Fascia was closed with continuous #1 Vicryl suture.  Subcutaneous tissue was closed with continuous 2-0 Vicryl and the skin  with 3-0 Vicryl subcuticular closure.  The right axillary incision was  closed in a similar manner.  The sponge, needles, and instrument counts  were correct according to the scrub nurse.  Dry sterile  dressing applied  over the incisions and around the chest tubes with Pleur-Evac suction.  The patient remained hemodynamically stable and transported to the SICU  in guarded, but stable condition.      Evelene Croon, M.D.  Electronically Signed    BB/MEDQ  D:  11/12/2008  T:  11/13/2008  Job:  [id number]

## 2010-08-02 NOTE — Discharge Summary (Signed)
NAME:  Colin [name], Colin [name] NO.:  [id number]   MEDICAL RECORD NO.:  [id number]          PATIENT TYPE:  INP   LOCATION:  2007                         FACILITY:  [facility]   PHYSICIAN:  Evelene Croon, M.D.     DATE OF BIRTH:  01-29-1966   DATE OF ADMISSION:  11/12/2008  DATE OF DISCHARGE:                               DISCHARGE SUMMARY   HISTORY:  The patient is a 45 year old male referred to Dr. Laneta Simmers from  Dr. 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  The patient is from [place].  He has been in the [place] for approximately 1-1/2 years.  He was evaluated by Dr. Gerrit Friends  for an anterior neck mass that was noted about 1 year ago.  Thyroid  ultrasound showed a 1.7-cm dominant nodule in the mid left thyroid lobe.  Biopsy done on July 02, 2008, showed a follicular lesion with no  evidence of malignancy.  Subsequent nuclear scan on Jul 30, 2008 showed  a nodule in the left lobe to be hypofunctioning.  Repeat biopsy in June  2010 again showed a follicular lesion and it was felt to be an  adenomatous nodule without malignancy.  The patient was referred to Dr.  Osborn Coho for treatment of a thyroglossal duct cyst, which Dr.  Gerrit Friends thought was causing the anterior neck mass.  The patient had a CT  scan of the neck to evaluate this and this revealed a 6-cm x 6.1-m  ascending aortic aneurysm that extended from the aortic root up to the  proximal arch and had a dissection within it.  This dissection appeared  to be start in the aortic root and extend up just to the takeoff of the  innominate artery, but did not appear to involve these arteries.  The  mid and distal aortic arch were normal in size as was the descending  aorta.  There was no dissection plan within the aortic arch beyond the  innominate artery and no dissection plane in the descending aorta.  On  questioning, the patient said he had a history of hypertension that was  difficult  to control at times.  He stated he remembered an episode that  happened a few weeks prior to coming to the [place] where he had a  sudden severe chest discomfort and shortness of breath and went to the  hospital where he was told that he had very high blood pressure.  He had  no diagnostic imaging done at that time in [place] and was treated with  blood pressure medication.  His symptoms resolved and he had no further  symptoms.  The patient was evaluated by Dr. Laneta Simmers who recommended  repair of this aortic aneurysm and dissection and he was admitted this  hospitalization for the procedure.   REVIEW OF SYMPTOMS:  Please see the history and physical.   PAST MEDICAL HISTORY:  Significant only for hypertension.   PAST SURGICAL HISTORY:  None.   SOCIAL HISTORY:  He is married with five children.  He has never smoked  nor use alcohol or drugs.   FAMILY HISTORY:  Positive for hypertension.   MEDICATIONS PRIOR TO ADMISSION:  1. Atenolol/chlorthalidone 50/25 mg daily.  2. Aspirin 81 mg daily.   PHYSICAL EXAMINATION:  Please see the history and physical done at the  time of admission.   HOSPITAL COURSE:  The patient was admitted electively and on November 12, 2008, he was taken to the operating room where he underwent a  replacement of the aortic arch with bypass of bovine arterial trunk  (innominate and the left carotid artery).  The patient tolerated the  procedure well, was taken to the surgical intensive care unit in stable  condition.   POSTOPERATIVE HOSPITAL COURSE:  The patient has overall remained  hemodynamically stable.  He did require aggressive blood pressure  management initially with nitroglycerin in the night prior, but  subsequently has transitioned to an oral regimen of medications that has  maintained adequate control.  All routine lines, monitors, drainage  devices have been discontinued in the standard fashion.  He has  tolerated a slow increase in activities  using standard protocols.  His  incision is healing well without evidence of infection.   His laboratory values do reveal an acute blood loss anemia.  These  values have stabilized.  His most recent hemoglobin and hematocrit dated  November 16, 2008 are 9.6 and 20 respectively.  Electrolytes are within  normal limits.  He does have some renal insufficiency.  His most recent  creatinine dated November 16, 2008 is 1.63 with BUN of 27.  He has had  some low-grade fevers that fell most likely to be attributable to  atelectasis.  Overall, his status is felt to be tentatively stable for  discharge, pending morning reevaluation in the morning and we will also  check a urinalysis and culture.  If he has no further fevers and is  otherwise medically stable, he is tentatively to be discharged on  November 18, 2008.   Medications for discharge at the time of this dictation are as follows:  1. Amlodipine 10 mg daily.  2. Aspirin 325 mg daily.  3. Clonidine 0.3 mg per 24-hour patch transdermally q. Week.  4. Metoprolol 200 mg p.o. q.8 h.  5. Oxycodone 5 mg IR 5-10 every 4-6 hours p.r.n. as needed for pain.   CONDITION ON DISCHARGE:  Stable and improving.   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   OTHER DIAGNOSES:  1. Hypertension with history of poor control.  2. Postoperative atelectasis with low-grade fevers.  3. Postoperative acute blood loss anemia.   INSTRUCTIONS:  The patient will receive written instructions in  regarding medications, activity, diet, wound care, and followup.  Followup include an appointment to see Dr. Laneta Simmers on December 08, 2008.  He is instructed to continue his follow up with primary care regarding  his long-term blood pressure management.      Rowe Clack, P.A.-C.      Evelene Croon, M.D.  Electronically Signed    WEG/MEDQ  D:  11/17/2008  T:  11/18/2008  Job:  [id number]   cc:   Velora Heckler, MD  Kinnie Scales  Annalee Genta, M.D.

## 2010-08-02 NOTE — Assessment & Plan Note (Signed)
OFFICE VISIT   Colin [name], Colin [name]  DOB:  [birth year]                                        December 07, 2008  CHART #:  [id number]   The patient is a 45 year old gentleman from [place] who recently underwent  repair with a chronic type A aortic dissection on 11/12/2008 by Dr.  Laneta Simmers.  He comes in today complaining of some back pain as well as some  shortness of breath.  He had had some confusion and he was sent home on  a clonidine patch to use once weekly, but he had placed all four patches  on himself at the same time.  He denies any fevers or chills.  No chest  pain.   PHYSICAL EXAMINATION:  General:  The patient is a 45 year old gentleman  in no acute distress.  Vital Signs:  His blood pressure is 126/80, pulse  68, respirations 18, his oxygen saturation is 97% on room air.  Lungs:  He has diminished breath sounds at the bases.  Cardiac:  Regular rate  and rhythm.  Normal S1 and S2.  There is no murmur.  Chest:  His sternal  incision is clean, dry, and intact.  Chest tube sites are healing well.   Chest x-ray shows bilateral pleural effusions.   IMPRESSION:  The patient is a 45 year old gentleman who is status post  repair of an ascending aortic aneurysm.  He has some back pain with no  distinct physical findings, I think it is likely just related to his  recent surgery.  He also is having some shortness of breath and on chest  x-ray, has fairly large bilateral pleural effusions.  He does not have  any peripheral edema but I do think he would benefit from diuresis given  the size of these pleural effusions, he may ultimately require  thoracentesis.  I have written him for an additional 30 Vicodin tablets  as needed for pain.  I also gave him prescriptions for Lasix 40 mg daily  and K-Dur 20 mEq daily for 14 days.  He has an appointment with Dr.  Laneta Simmers next week, we will repeat his chest x-ray at that time and then  Dr. Laneta Simmers can decide  whether he needs to continue on the diuretic.  He  was very carefully  instructed on the use of the Catapres patch and does express  understanding that this is to be one patch worn weekly.   Salvatore Decent Dorris Fetch, M.D.  Electronically Signed   SCH/MEDQ  D:  12/07/2008  T:  12/08/2008  Job:  [id number]   cc:   Evelene Croon, M.D.

## 2010-08-02 NOTE — Consult Note (Signed)
NEW PATIENT CONSULTATION   [name], [name]  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   CLINICAL HISTORY:  I was asked by Dr. Gerrit [name] to evaluate the patient for  surgical treatment of an ascending aneurysm with dissection.  He is a 45-  year-old African gentleman from [place].  He has been in the [place]  for about a year and half.  He was evaluated by Dr. Darnell [name] for an  anterior neck mass that he noticed about 1 year ago.  Thyroid ultrasound  showed a 1.7-cm dominant nodule in the mid left thyroid lobe.  Biopsy on  July 02, 2008, showed a follicular lesion with no evidence of  malignancy.  Subsequent nuclear scan on Jul 30, 2008, showed the nodule  in the left lobe to be hypofunctioning.  Repeat biopsy in June 2010  again showed a follicular lesion and it was felt to be an adenomatous  nodule without malignancy identified.  The plan was to refer the patient  to Dr. Osborn [name] for treatment of the thyroglossal duct cyst which  Dr. Gerrit [name] thought was causing the anterior neck mass.  The patient had  a CT scan of the neck to evaluate this which happened to show a 6-cm x  6.1-cm ascending aortic aneurysm that extended from the aortic root up  to the proximal arch and had a dissection within it.  This dissection  appeared to start in the aortic root and extend up just to the takeoff  of the innominate artery but did not appear to involve these arteries.  The mid and distal aortic arch were of normal size as was the descending  aorta.  There was no dissection plane within the aortic arch beyond the  innominate artery and no dissection plane in the descending aorta.  On  questioning the patient he said he has had a history of hypertension  that has been difficult to  control at times.  He said they did remember  one episode that happened a few weeks prior to coming to the [place] where he had sudden severe chest discomfort and shortness of  breath and went to the hospital where he was told that he had a very  high blood pressure.  He said no diagnostic imaging was done at that  time in [place] and he was treated with blood pressure medication.  His  symptoms resolved and had no further symptoms.   REVIEW OF SYSTEMS:  GENERAL:  He denies any fever or chills.  He has had  no recent weight changes.  He denies fatigue.  EYES:  Negative.  ENT:  Negative.  ENDOCRINE:  He denies diabetes and hypothyroidism.  CARDIOVASCULAR:  He denies any chest pain or pressure.  He denies PND  and orthopnea.  He has had no exertional dyspnea.  He denies  palpitations and peripheral edema.  RESPIRATORY:  He denies cough and sputum production.  GI:  He has had no nausea or vomiting.  Denies melena and bright red  blood per rectum.  GU:  He denies dysuria and hematuria.  MUSCULOSKELETAL:  He denies arthralgias and myalgias.  NEUROLOGIC:  He has had no focal weakness or numbness.  He denies  dizziness and syncope.  He has never had a TIA or a stroke.  PSYCHIATRIC:  Negative.  HEMATOLOGIC:  Negative.   PAST MEDICAL HISTORY:  Significant only for hypertension.   PAST SURGICAL HISTORY:  He has had no prior surgical history.   SOCIAL HISTORY:  He is married and has 5 children.  He has been in the  [place] for about 1-1/2 years and works for [name].  He has never  smoked and denies alcohol or drug use.   FAMILY HISTORY:  Positive for hypertension.  There is no history of  aneurysm disease or heart disease.  There is no history of valve  disease.   MEDICATIONS:  Atenolol at an unknown dose daily and aspirin 81 mg daily.   PHYSICAL EXAMINATION:  His blood pressure is 181/102 in right arm and  168/108 in the left arm.  He had not taken his blood pressure   medication.  Pulse 70 and regular, respiratory rate 18 and unlabored.  Oxygen saturation on room air is 98%.  He is a well-developed young  gentleman in no acute distress.  HEENT exam shows him to be  normocephalic and atraumatic.  Pupils are equal and reactive to light  and accommodation.  Extraocular muscles are intact.  Throat is clear.  Neck exam shows normal carotid pulses bilaterally.  There are no bruits.  There is no adenopathy or thyromegaly.  There is a palpable mass in the  midline upper neck consistent with a thyroglossal duct cyst.  Cardiac  exam shows a regular rate and rhythm with normal S1 and S2.  There is no  systolic or diastolic murmur audible.  His lungs are clear.  Abdominal  exam shows active bowel sounds.  His abdomen is soft and nontender.  There are no palpable masses or organomegaly.  Extremities shows no  peripheral edema.  Pedal and radial pulses are palpable bilaterally.  Skin is warm and dry.  Neurologic exam shows him to be alert and  oriented x3.  Motor and sensory exam is grossly normal.   IMPRESSION:  This patient has a 6.1-cm ascending aortic aneurysm with an  aortic dissection extending from the aortic root up to the takeoff of  the innominate artery.  I suspect this is a chronic dissection based on  his history of having this episode of hypertension and chest discomfort  prior to coming to the [place] about 1-1/2 years ago.  I do not  hear a murmur to suggest aortic insufficiency or aortic stenosis, but we  will obtain an echocardiogram to evaluate his left ventricular function  and the aortic valve.  This will require surgical repair and he may be a  candidate for valve-sparing aortic root and ascending aortic  replacement.  If his aortic valve is normal, we will plan on performing  that procedure.  I told him that if he has any significant aortic  stenosis or insufficiency, then we would likely have to replace his  valve and ascending aorta  using mechanical valve conduit.  I discussed  the need for coronary reconstruction and the potential complications  associated with that including development of coronary stenosis and  myocardial infarction.  I discussed the use of a mechanical valve and  need for lifelong anticoagulation if we do replace his aortic valve.  He  is in full agreement with that.  I  reviewed the benefits and risks of  surgery including but not limited to bleeding, blood transfusion,  infection, stroke, myocardial infarction, coronary stenosis, heart block  requiring permanent pacemaker, organ failure, and death.  He understands  all this and would like to proceed with surgery as soon as we can do it.  I will tentatively plan to do surgery next Thursday, November 12, 2008.  He is hypertensive today, and I asked him to take his blood pressure  medication as soon as he gets home and to monitor his blood pressure  closely until the time of surgery.   Evelene Croon, M.D.  Electronically Signed   BB/MEDQ  D:  11/04/2008  T:  11/05/2008  Job:  [id number]   cc:   Colin Heckler, MD  Kinnie Scales Annalee Genta, M.D.

## 2012-05-10 ENCOUNTER — Other Ambulatory Visit: Payer: Self-pay | Admitting: Nephrology

## 2012-05-10 DIAGNOSIS — N2581 Secondary hyperparathyroidism of renal origin: Secondary | ICD-10-CM

## 2012-05-13 ENCOUNTER — Ambulatory Visit
Admission: RE | Admit: 2012-05-13 | Discharge: 2012-05-13 | Disposition: A | Discharge status: Home / Self Care | Payer: Managed Care, Other (non HMO) | Source: Ambulatory Visit | Attending: Nephrology | Admitting: Nephrology

## 2012-05-15 ENCOUNTER — Ambulatory Visit (INDEPENDENT_AMBULATORY_CARE_PROVIDER_SITE_OTHER): Payer: Managed Care, Other (non HMO) | Admitting: Surgery

## 2012-05-15 ENCOUNTER — Encounter (INDEPENDENT_AMBULATORY_CARE_PROVIDER_SITE_OTHER): Payer: Self-pay | Admitting: Surgery

## 2012-05-15 VITALS — BP 150/98 | HR 60 | Temp 97.2°F | Resp 16 | Ht 71.0 in | Wt 191.8 lb

## 2012-05-15 NOTE — Patient Instructions (Signed)

## 2012-05-15 NOTE — Progress Notes (Signed)
General Surgery [facility]  Chief Complaint  Patient presents with  . New Evaluation    enlarging thyroid nodule, primary hyperparathyroidism - referral from Dr. Elvis Coil; primary MD is Dr. Robley Fries    HISTORY: Patient is a 47 year old black male who was initially examined by me in 2010 for thyroglossal duct cyst. His workup included a CT scan of the neck and chest which revealed an ascending aortic aneurysm. Patient was referred to cardiothoracic surgery and underwent operative repair.  He subsequently underwent resection of his thyroglossal duct cyst by ENT. Patient is now followed closely by his nephrologist and his primary care physician.  Recent laboratory studies have shown an elevated calcium levels ranging from 11.4-12.1. An intact PTH level is elevated from 76-112. Patient underwent a thyroid ultrasound which demonstrated a 1.2 cm mass behind the left thyroid lobe consistent with parathyroid adenoma.  The same study also demonstrated interval enlargement of a dominant nodule in the left thyroid lobe now measuring 2.3 cm and containing microcalcifications.  Patient presents today for evaluation for primary hyperparathyroidism and for an enlarging left thyroid nodule.  Past Medical History  Diagnosis Date  . Hypertension      Current Outpatient Prescriptions  Medication Sig Dispense Refill  . amLODipine-valsartan (EXFORGE) 10-160 MG per tablet Take 1 tablet by mouth daily.      . cloNIDine (CATAPRES) 0.1 MG tablet Take 0.1 mg by mouth 2 (two) times daily.      . hydrochlorothiazide (MICROZIDE) 12.5 MG capsule Take 12.5 mg by mouth daily.      . nebivolol (BYSTOLIC) 10 MG tablet Take 10 mg by mouth daily.       No current facility-administered medications for this visit.     No Known Allergies   Family History  Problem Relation Age of Onset  . Hypertension Father      History   Social History  . Marital Status: Married    Spouse Name:  N/A    Number of Children: N/A  . Years of Education: N/A   Social History Main Topics  . Smoking status: Never Smoker   . Smokeless tobacco: Never Used  . Alcohol Use: Yes     Comment: occ  . Drug Use: No  . Sexually Active: None   Other Topics Concern  . None   Social History Narrative  . None     REVIEW OF SYSTEMS - PERTINENT POSITIVES ONLY: Denies compressive symptoms. Denies tremor. Denies palpitations. Denies new mass. Denies pain.  EXAM: Filed Vitals:   05/15/12 0855  BP: 150/98  Pulse: 60  Temp: 97.2 F (36.2 C)  Resp: 16    HEENT: normocephalic; pupils equal and reactive; sclerae clear; dentition good; mucous membranes moist NECK:  Well-healed anterior surgical incision in the high neck; no palpable masses in the anterior neck or thyroid bed; symmetric on extension; no palpable anterior or posterior cervical lymphadenopathy; no supraclavicular masses; no tenderness CHEST: clear to auscultation bilaterally without rales, rhonchi, or wheezes; well-healed median sternotomy incision CARDIAC: regular rate and rhythm without significant murmur; peripheral pulses are full ABDOMEN: soft without distension; bowel sounds present; no mass; no hepatosplenomegaly; no hernia EXT:  non-tender without edema; no deformity NEURO: no gross focal deficits; no sign of tremor   LABORATORY RESULTS: See [facility] ([facility]) for most recent results   RADIOLOGY RESULTS: See [facility] ([facility]pic) for most recent results   IMPRESSION: #1 primary hyperparathyroidism, 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 I do not think he requires further diagnostic studies. I have recommended neck exploration with left tyroid lobectomy and left parathyroidectomy. We discussed the procedure. We discussed the hospital stay. We  discussed the postoperative recovery and return to work. Patient understands and wishes to proceed in the near future. We will make arrangements for surgery at a time convenient for the patient.  The risks and benefits of the procedure have been discussed at length with the patient.  The patient understands the proposed procedure, potential alternative treatments, and the course of recovery to be expected.  All of the patient's questions have been answered at this time.  The patient wishes to proceed with surgery.  Velora Heckler, MD, FACS General & Endocrine Surgery [facility]   Visit Diagnoses: 1. Hyperparathyroidism, primary   2. Thyroid nodule, uninodular, left lobe     Primary Care Physician: Pearla Dubonnet, MD

## 2012-05-28 ENCOUNTER — Encounter (INDEPENDENT_AMBULATORY_CARE_PROVIDER_SITE_OTHER): Payer: Self-pay

## 2013-01-16 ENCOUNTER — Encounter (HOSPITAL_COMMUNITY): Payer: Managed Care, Other (non HMO)

## 2013-01-29 ENCOUNTER — Ambulatory Visit (INDEPENDENT_AMBULATORY_CARE_PROVIDER_SITE_OTHER): Payer: Managed Care, Other (non HMO) | Admitting: Physician Assistant

## 2013-01-29 ENCOUNTER — Other Ambulatory Visit: Payer: Self-pay

## 2013-01-29 ENCOUNTER — Encounter: Payer: Self-pay | Admitting: *Deleted

## 2013-01-29 ENCOUNTER — Encounter: Payer: Self-pay | Admitting: Cardiovascular Disease

## 2013-01-29 DIAGNOSIS — R079 Chest pain, unspecified: Secondary | ICD-10-CM

## 2013-01-29 NOTE — Progress Notes (Signed)
Exercise Treadmill Test  Pre-Exercise Testing Evaluation Rhythm: normal sinus  Rate: 62 bpm     Test  Exercise Tolerance Test Ordering MD: Melene Muller, MD  Interpreting MD: lenze  Unique Test No: 1  Treadmill:  1  Indication for ETT: chest pain - rule out ischemia  Contraindication to ETT: No   Stress Modality: exercise - treadmill  Cardiac Imaging Performed: non   Protocol: standard Bruce - maximal  Max BP:  165/109  Max MPHR (bpm):  171 85% MPR (bpm):  145  MPHR obtained (bpm):  114 % MPHR obtained:  66  Reached 85% MPHR (min:sec):  Did not reach Total Exercise Time (min-sec):  9:30  Workload in METS:  10.9 Borg Scale: 14  Reason ETT Terminated:  fatigue    ST Segment Analysis At Rest: normal ST segments - no evidence of significant ST depression With Exercise: no evidence of significant ST depression  Other Information Arrhythmia:  No Angina during ETT:  absent (0) Quality of ETT:  non-diagnostic  ETT Interpretation:  normal - no evidence of ischemia by ST analysis, Nondiagnostic as he didn't obtain 85% MPHR  Comments: Patient couldn't walk any further to obtain 85% MPHR. Only obtained HR 114(66%). No chest pain, just fatigue. BP up at beginning of test but patient held some of his meds. BP actually improved with exercise.  Recommendations: Discuss with Dr. Kevan Ny. May want to order [facility]

## 2013-06-24 ENCOUNTER — Encounter (INDEPENDENT_AMBULATORY_CARE_PROVIDER_SITE_OTHER): Payer: Self-pay | Admitting: Surgery

## 2013-06-24 ENCOUNTER — Ambulatory Visit (INDEPENDENT_AMBULATORY_CARE_PROVIDER_SITE_OTHER): Payer: Managed Care, Other (non HMO) | Admitting: Surgery

## 2013-06-24 VITALS — BP 162/80 | HR 74 | Temp 98.7°F | Resp 14 | Ht 70.0 in | Wt 196.8 lb

## 2013-06-24 DIAGNOSIS — E041 Nontoxic single thyroid nodule: Secondary | ICD-10-CM

## 2013-06-24 DIAGNOSIS — E210 Primary hyperparathyroidism: Secondary | ICD-10-CM

## 2013-06-24 NOTE — Progress Notes (Signed)
General Surgery [facility]  Chief Complaint  Patient presents with  . Follow-up    thyroid nodule, parathyroid adenoma - primary care is Dr. Marcellus Scott    HISTORY: Patient is a 48 year old male known to my practice. Patient was seen in February 2014. He has a dominant left thyroid nodule with microcalcifications. This has not been biopsied. He has a concurrent parathyroid adenoma which is likely in the left neck. Recent laboratories show an elevated calcium level of 10.8 and an elevated intact PTH level of 206 consistent with primary hyperparathyroidism. Last year I recommended neck exploration with left thyroid lobectomy and parathyroidectomy. Due to financial concerns, the patient failed to schedule the surgical procedure with our practice.  Patient returns at this time to discuss surgery. Recent laboratories are noted above. No further evaluation of the thyroid nodule has been performed. Patient wishes at this time to proceed with surgery.  PERTINENT REVIEW OF SYSTEMS: Denies tremor. Denies palpitation. Denies compressive symptoms. Denies pain.  EXAM: HEENT: normocephalic; pupils equal and reactive; sclerae clear; dentition good; mucous membranes moist NECK:  Well-healed anterior cervical incision; symmetric on extension; no palpable anterior or posterior cervical lymphadenopathy; no supraclavicular masses; no tenderness; palpable firm 2 cm nodule inferior pole the left thyroid lobe; right thyroid lobe without palpable abnormality CHEST: clear to auscultation bilaterally without rales, rhonchi, or wheezes CARDIAC: regular rate and rhythm without significant murmur; peripheral pulses are full EXT:  non-tender without edema; no deformity NEURO: no gross focal deficits; no sign of tremor   IMPRESSION: #1 left thyroid nodule, 2.3 cm, with microcalcifications, mild interval enlargement #2 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 We reviewed his ultrasound from last year. We discussed neck exploration with thyroid lobectomy and parathyroidectomy concurrently. We discussed the hospital stay to be anticipated. We discussed the potential need for thyroid hormone replacement. We discussed the potential for a second surgery in the event of thyroid malignancy for completion thyroidectomy. Patient understands and wishes to proceed with surgery in the near future. We will make arrangements for his procedure. Patient will also discuss financial obligations with our counselors during his visit today.  The risks and benefits of the procedure have been discussed at length with the patient.  The patient understands the proposed procedure, potential alternative treatments, and the course of recovery to be expected.  All of the patient's questions have been answered at this time.  The patient wishes to proceed with surgery.  Earnstine Regal, MD, [facility] Office: [phone]  Visit Diagnoses: 1. Hyperparathyroidism, primary   2. Thyroid nodule, uninodular, left lobe

## 2013-06-24 NOTE — Patient Instructions (Signed)

## 2013-10-24 ENCOUNTER — Emergency Department (HOSPITAL_COMMUNITY)
Admission: EM | Admit: 2013-10-24 | Discharge: 2013-10-24 | Disposition: A | Discharge status: Home / Self Care | Payer: Managed Care, Other (non HMO) | Attending: Emergency Medicine | Admitting: Emergency Medicine

## 2013-10-24 ENCOUNTER — Encounter (HOSPITAL_COMMUNITY): Payer: Self-pay | Admitting: Emergency Medicine

## 2013-10-24 ENCOUNTER — Emergency Department (HOSPITAL_COMMUNITY): Payer: Managed Care, Other (non HMO)

## 2013-10-24 DIAGNOSIS — R52 Pain, unspecified: Secondary | ICD-10-CM | POA: Insufficient documentation

## 2013-10-24 DIAGNOSIS — M25539 Pain in unspecified wrist: Secondary | ICD-10-CM | POA: Insufficient documentation

## 2013-10-24 DIAGNOSIS — Z79899 Other long term (current) drug therapy: Secondary | ICD-10-CM | POA: Insufficient documentation

## 2013-10-24 DIAGNOSIS — I10 Essential (primary) hypertension: Secondary | ICD-10-CM | POA: Insufficient documentation

## 2013-10-24 DIAGNOSIS — M25531 Pain in right wrist: Secondary | ICD-10-CM

## 2013-10-24 DIAGNOSIS — I719 Aortic aneurysm of unspecified site, without rupture: Secondary | ICD-10-CM | POA: Insufficient documentation

## 2013-10-24 MED ORDER — TRAMADOL HCL 50 MG PO TABS: 50.0000 mg | ORAL_TABLET | Freq: Four times a day (QID) | ORAL | Status: DC | PRN

## 2013-10-24 MED ORDER — OXYCODONE HCL 5 MG PO TABS: 5.0000 mg | ORAL_TABLET | Freq: Four times a day (QID) | ORAL | Status: DC | PRN

## 2013-10-24 MED ORDER — HYDROCODONE-ACETAMINOPHEN 5-325 MG PO TABS
1.0000 | ORAL_TABLET | Freq: Once | ORAL | Status: AC
  Administered 2013-10-24: 1 via ORAL
  Filled 2013-10-24: qty 1

## 2013-10-24 NOTE — ED Provider Notes (Signed)
CSN: [id number]     Arrival date & time 10/24/13  0831 History   First MD Initiated Contact with Patient 10/24/13 [id number]     Chief Complaint  Patient presents with  . Wrist Pain     (Consider location/radiation/quality/duration/timing/severity/associated sxs/prior Treatment) HPI  Colin [name] is a 48 y.o. male complaining of right (dominant) wrist pain greater than 4 weeks of duration significantly worsening over the last 24 hours, rated at 8/10, exacerbated by pronation with pain radiation and pins and needles paresthesia to the dorsum of the thumb. 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 He denies any weakness, trauma, prior surgery the affected site. No pain medication prior to arrival.   Past Medical History  Diagnosis Date  . Hypertension   . Descending aortic aneurysm 2010    repaired by Dr. Gilford Raid   Past Surgical History  Procedure Laterality Date  . Cardiac surgery      pt unsure of actual Sx - knows it was due to "anurysm"  . Throat surgery     Family History  Problem Relation Age of Onset  . Hypertension Father    History  Substance Use Topics  . Smoking status: Never Smoker   . Smokeless tobacco: Never Used  . Alcohol Use: Yes     Comment: occ    Review of Systems  10 systems reviewed and found to be negative, except as noted in the HPI.   Allergies  Review of patient's allergies indicates no known allergies.  Home Medications   Prior to Admission medications   Medication Sig Start Date End Date Taking? Authorizing Provider  amLODipine (NORVASC) 10 MG tablet Take 10 mg by mouth daily.   Yes Historical Provider, MD  atenolol (TENORMIN) 50 MG tablet Take 50 mg by mouth daily.  06/02/13  Yes Historical Provider, MD  cloNIDine (CATAPRES) 0.1 MG tablet Take 0.1 mg by mouth at bedtime.    Yes Historical Provider, MD  hydrochlorothiazide (MICROZIDE) 12.5 MG capsule Take 12.5 mg by mouth daily.   Yes Historical Provider,  MD  KLOR-CON M20 20 MEQ tablet Take 20 mEq by mouth daily.  05/27/13  Yes Historical Provider, MD  losartan (COZAAR) 100 MG tablet Take 100 mg by mouth daily.  05/26/13  Yes Historical Provider, MD  simvastatin (ZOCOR) 20 MG tablet Take 20 mg by mouth daily.  05/31/13  Yes Historical Provider, MD  traMADol (ULTRAM) 50 MG tablet Take 1 tablet (50 mg total) by mouth every 6 (six) hours as needed. 10/24/13   Satoria Dunlop, PA-C   BP 156/90  Pulse 62  Temp(Src) 98.3 F (36.8 C) (Oral)  Resp 18  Ht 5\' 10"  (1.778 m)  Wt 196 lb (88.905 kg)  BMI 28.12 kg/m2  SpO2 100% Physical Exam  Nursing note and vitals reviewed. Constitutional: He is oriented to person, place, and time. He appears well-developed and well-nourished. No distress.  HENT:  Head: Normocephalic.  Mouth/Throat: Oropharynx is clear and moist.  Eyes: Conjunctivae and EOM are normal.  Cardiovascular: Normal rate, regular rhythm and intact distal pulses.   Pulmonary/Chest: Effort normal and breath sounds normal. No stridor.  Abdominal: Soft.  Musculoskeletal: Normal range of motion.  Trace swelling to dorsum of right wrist. Patient is tender to palpation along the first metacarpal. Excellent range of motion. Neurovascularly intact. Phalen and Tinel are negative.  Neurological: He is alert and oriented to person, place, and time.  Psychiatric: He has a normal mood and affect.  ED Course  Procedures (including critical care time) Labs Review Labs Reviewed - No data to display  Imaging Review Dg Wrist Complete Right  10/24/2013   CLINICAL DATA:  Right wrist pain.  EXAM: RIGHT WRIST - COMPLETE 3+ VIEW  COMPARISON:  None.  FINDINGS: There is no evidence of fracture or dislocation. There is no evidence of arthropathy or other focal bone abnormality. Soft tissues are unremarkable.  IMPRESSION: Negative.   Electronically Signed   By: Aletta Edouard M.D.   On: 10/24/2013 09:18     EKG Interpretation None      MDM   Final  diagnoses:  Wrist pain, acute, right    Filed Vitals:   10/24/13 0839 10/24/13 0900 10/24/13 0926  BP: 169/105 141/78 156/90  Pulse: 66 66 62  Temp: 98.3 F (36.8 C)    TempSrc: Oral    Resp: 18  18  Height: 5\' 10"  (1.778 m)    Weight: 196 lb (88.905 kg)    SpO2: 99% 99% 100%    Medications  HYDROcodone-acetaminophen (NORCO/VICODIN) 5-325 MG per tablet 1 tablet (1 tablet Oral Given 10/24/13 0925)    Colin [name] is a 48 y.o. male presenting with atraumatic right wrist pain, secondary to repetitive stress. Physical exam is not consistent with a carpal tunnel. Patient neurovascularly intact with excellent range of motion. X-ray does not reveal pathology. Patient will be given splint, advised rest and elevation. I've asked the patient to follow with his primary care and have provided him with hand surgeon referral as needed.   Evaluation does not show pathology that would require ongoing emergent intervention or inpatient treatment. Pt is hemodynamically stable and mentating appropriately. Discussed findings and plan with patient/guardian, who agrees with care plan. All questions answered. Return precautions discussed and outpatient follow up given.   New Prescriptions   TRAMADOL (ULTRAM) 50 MG TABLET    Take 1 tablet (50 mg total) by mouth every 6 (six) hours as needed.         Monico Blitz, PA-C 10/24/13 [phone]

## 2013-10-24 NOTE — Discharge Instructions (Signed)
For pain control you may take Tramadol. Do not drink alcohol drive or operate heavy machinery when taking Tramadol.  Rest, Ice intermittently (in the first 24-48 hours), Gentle compression with an Ace wrap, and elevate (Limb above the level of the heart)   Please follow with your primary care doctor in the next 2 days for a check-up. They must obtain records for further management.   Do not hesitate to return to the Emergency Department for any new, worsening or concerning symptoms.

## 2013-10-24 NOTE — ED Notes (Signed)
Pt c/o right wrist pain x 1 month. Pt has tried otc medications without relief. 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

## 2013-10-24 NOTE — ED Provider Notes (Signed)
Medical screening examination/treatment/procedure(s) were performed by non-physician practitioner and as supervising physician I was immediately available for consultation/collaboration.   EKG Interpretation None        Delice Bison Arrow Emmerich, DO 10/24/13 1508

## 2014-06-05 ENCOUNTER — Other Ambulatory Visit (HOSPITAL_COMMUNITY): Payer: Self-pay | Admitting: Nephrology

## 2014-06-05 ENCOUNTER — Other Ambulatory Visit: Payer: Self-pay | Admitting: Nephrology

## 2014-06-05 DIAGNOSIS — N2581 Secondary hyperparathyroidism of renal origin: Secondary | ICD-10-CM

## 2014-06-12 ENCOUNTER — Other Ambulatory Visit: Payer: Managed Care, Other (non HMO)

## 2014-06-17 ENCOUNTER — Encounter (HOSPITAL_COMMUNITY): Payer: Managed Care, Other (non HMO)

## 2014-06-17 ENCOUNTER — Ambulatory Visit (HOSPITAL_COMMUNITY): Admission: RE | Admit: 2014-06-17 | Payer: Managed Care, Other (non HMO) | Source: Ambulatory Visit

## 2014-06-26 ENCOUNTER — Encounter (HOSPITAL_COMMUNITY)
Admission: RE | Admit: 2014-06-26 | Discharge: 2014-06-26 | Disposition: A | Discharge status: Home / Self Care | Payer: Managed Care, Other (non HMO) | Source: Ambulatory Visit | Attending: Nephrology | Admitting: Nephrology

## 2014-06-26 DIAGNOSIS — N2581 Secondary hyperparathyroidism of renal origin: Secondary | ICD-10-CM

## 2014-06-26 MED ORDER — TECHNETIUM TC 99M SESTAMIBI GENERIC - CARDIOLITE
25.0000 | Freq: Once | INTRAVENOUS | Status: AC | PRN
  Administered 2014-06-26: 25 via INTRAVENOUS

## 2014-08-31 DIAGNOSIS — Z0279 Encounter for issue of other medical certificate: Secondary | ICD-10-CM

## 2016-06-17 ENCOUNTER — Inpatient Hospital Stay (HOSPITAL_COMMUNITY): Payer: Managed Care, Other (non HMO)

## 2016-06-17 ENCOUNTER — Encounter (HOSPITAL_COMMUNITY): Payer: Self-pay | Admitting: Emergency Medicine

## 2016-06-17 ENCOUNTER — Inpatient Hospital Stay (HOSPITAL_COMMUNITY)
Admission: EM | Admit: 2016-06-17 | Discharge: 2016-06-20 | DRG: 304 | Disposition: A | Discharge status: Home / Self Care | Payer: Managed Care, Other (non HMO) | Attending: Internal Medicine | Admitting: Internal Medicine

## 2016-06-17 ENCOUNTER — Emergency Department (HOSPITAL_COMMUNITY): Payer: Managed Care, Other (non HMO)

## 2016-06-17 DIAGNOSIS — R0781 Pleurodynia: Secondary | ICD-10-CM

## 2016-06-17 DIAGNOSIS — Z8249 Family history of ischemic heart disease and other diseases of the circulatory system: Secondary | ICD-10-CM

## 2016-06-17 DIAGNOSIS — I248 Other forms of acute ischemic heart disease: Secondary | ICD-10-CM | POA: Diagnosis present

## 2016-06-17 DIAGNOSIS — F1010 Alcohol abuse, uncomplicated: Secondary | ICD-10-CM | POA: Diagnosis present

## 2016-06-17 DIAGNOSIS — I161 Hypertensive emergency: Secondary | ICD-10-CM | POA: Diagnosis not present

## 2016-06-17 DIAGNOSIS — I719 Aortic aneurysm of unspecified site, without rupture: Secondary | ICD-10-CM | POA: Diagnosis present

## 2016-06-17 DIAGNOSIS — E041 Nontoxic single thyroid nodule: Secondary | ICD-10-CM | POA: Diagnosis present

## 2016-06-17 DIAGNOSIS — R0600 Dyspnea, unspecified: Secondary | ICD-10-CM | POA: Diagnosis not present

## 2016-06-17 DIAGNOSIS — E210 Primary hyperparathyroidism: Secondary | ICD-10-CM | POA: Diagnosis present

## 2016-06-17 DIAGNOSIS — I712 Thoracic aortic aneurysm, without rupture: Secondary | ICD-10-CM | POA: Diagnosis present

## 2016-06-17 DIAGNOSIS — N189 Chronic kidney disease, unspecified: Secondary | ICD-10-CM | POA: Diagnosis present

## 2016-06-17 DIAGNOSIS — Z9119 Patient's noncompliance with other medical treatment and regimen: Secondary | ICD-10-CM | POA: Diagnosis not present

## 2016-06-17 DIAGNOSIS — D351 Benign neoplasm of parathyroid gland: Secondary | ICD-10-CM | POA: Diagnosis present

## 2016-06-17 DIAGNOSIS — E876 Hypokalemia: Secondary | ICD-10-CM | POA: Diagnosis present

## 2016-06-17 DIAGNOSIS — R740 Nonspecific elevation of levels of transaminase and lactic acid dehydrogenase [LDH]: Secondary | ICD-10-CM

## 2016-06-17 DIAGNOSIS — I5033 Acute on chronic diastolic (congestive) heart failure: Secondary | ICD-10-CM | POA: Diagnosis present

## 2016-06-17 DIAGNOSIS — R748 Abnormal levels of other serum enzymes: Secondary | ICD-10-CM | POA: Diagnosis not present

## 2016-06-17 DIAGNOSIS — I130 Hypertensive heart and chronic kidney disease with heart failure and stage 1 through stage 4 chronic kidney disease, or unspecified chronic kidney disease: Secondary | ICD-10-CM | POA: Diagnosis present

## 2016-06-17 DIAGNOSIS — J811 Chronic pulmonary edema: Secondary | ICD-10-CM

## 2016-06-17 DIAGNOSIS — N179 Acute kidney failure, unspecified: Secondary | ICD-10-CM | POA: Diagnosis not present

## 2016-06-17 DIAGNOSIS — I10 Essential (primary) hypertension: Secondary | ICD-10-CM

## 2016-06-17 DIAGNOSIS — R7401 Elevation of levels of liver transaminase levels: Secondary | ICD-10-CM | POA: Diagnosis present

## 2016-06-17 DIAGNOSIS — R778 Other specified abnormalities of plasma proteins: Secondary | ICD-10-CM | POA: Diagnosis present

## 2016-06-17 DIAGNOSIS — R7989 Other specified abnormal findings of blood chemistry: Secondary | ICD-10-CM | POA: Diagnosis present

## 2016-06-17 HISTORY — DX: Nontoxic single thyroid nodule: E04.1

## 2016-06-17 HISTORY — DX: Alcohol abuse, uncomplicated: F10.10

## 2016-06-17 HISTORY — DX: Primary hyperparathyroidism: E21.0

## 2016-06-17 LAB — BASIC METABOLIC PANEL
Anion gap: 12 (ref 5–15)
BUN: 49 mg/dL — AB (ref 6–20)
CALCIUM: 9.5 mg/dL (ref 8.9–10.3)
CHLORIDE: 108 mmol/L (ref 101–111)
CO2: 16 mmol/L — AB (ref 22–32)
CREATININE: 4.17 mg/dL — AB (ref 0.61–1.24)
GFR calc non Af Amer: 15 mL/min — ABNORMAL LOW (ref >60)
GFR, EST AFRICAN AMERICAN: 17 mL/min — AB (ref >60)
Glucose, Bld: 124 mg/dL — ABNORMAL HIGH (ref 65–99)
Potassium: 3.7 mmol/L (ref 3.5–5.1)
SODIUM: 136 mmol/L (ref 135–145)

## 2016-06-17 LAB — I-STAT TROPONIN, ED: TROPONIN I, POC: 0.12 ng/mL — AB (ref 0.00–0.08)

## 2016-06-17 LAB — CBC
HCT: 38.7 % — ABNORMAL LOW (ref 39.0–52.0)
Hemoglobin: 12.9 g/dL — ABNORMAL LOW (ref 13.0–17.0)
MCH: 28.2 pg (ref 26.0–34.0)
MCHC: 33.3 g/dL (ref 30.0–36.0)
MCV: 84.7 fL (ref 78.0–100.0)
Platelets: 133 10*3/uL — ABNORMAL LOW (ref 150–400)
RBC: 4.57 MIL/uL (ref 4.22–5.81)
RDW: 13.8 % (ref 11.5–15.5)
WBC: 5 10*3/uL (ref 4.0–10.5)

## 2016-06-17 LAB — CREATININE, SERUM
CREATININE: 4.19 mg/dL — AB (ref 0.61–1.24)
GFR calc Af Amer: 17 mL/min — ABNORMAL LOW (ref >60)
GFR calc non Af Amer: 15 mL/min — ABNORMAL LOW (ref >60)

## 2016-06-17 LAB — COMPREHENSIVE METABOLIC PANEL
ALK PHOS: 173 U/L — AB (ref 38–126)
ALT: 243 U/L — ABNORMAL HIGH (ref 17–63)
ANION GAP: 8 (ref 5–15)
AST: 213 U/L — AB (ref 15–41)
Albumin: 2.6 g/dL — ABNORMAL LOW (ref 3.5–5.0)
BILIRUBIN TOTAL: 0.5 mg/dL (ref 0.3–1.2)
BUN: 49 mg/dL — AB (ref 6–20)
CALCIUM: 9.8 mg/dL (ref 8.9–10.3)
CO2: 23 mmol/L (ref 22–32)
Chloride: 104 mmol/L (ref 101–111)
Creatinine, Ser: 4.6 mg/dL — ABNORMAL HIGH (ref 0.61–1.24)
GFR calc Af Amer: 16 mL/min — ABNORMAL LOW (ref >60)
GFR calc non Af Amer: 13 mL/min — ABNORMAL LOW (ref >60)
GLUCOSE: 117 mg/dL — AB (ref 65–99)
Potassium: 3.2 mmol/L — ABNORMAL LOW (ref 3.5–5.1)
Sodium: 135 mmol/L (ref 135–145)
TOTAL PROTEIN: 6 g/dL — AB (ref 6.5–8.1)

## 2016-06-17 LAB — TROPONIN I
Troponin I: 0.11 ng/mL (ref <0.03)
Troponin I: 0.11 ng/mL (ref <0.03)

## 2016-06-17 LAB — I-STAT CG4 LACTIC ACID, ED: Lactic Acid, Venous: 1.22 mmol/L (ref 0.5–1.9)

## 2016-06-17 LAB — MRSA PCR SCREENING: MRSA BY PCR: NEGATIVE

## 2016-06-17 LAB — TSH: TSH: 1.77 u[IU]/mL (ref 0.350–4.500)

## 2016-06-17 LAB — BRAIN NATRIURETIC PEPTIDE: B Natriuretic Peptide: 1743.8 pg/mL — ABNORMAL HIGH (ref 0.0–100.0)

## 2016-06-17 MED ORDER — ADULT MULTIVITAMIN W/MINERALS CH
1.0000 | ORAL_TABLET | Freq: Every day | ORAL | Status: DC
  Administered 2016-06-17 – 2016-06-20 (×4): 1 via ORAL
  Filled 2016-06-17 (×4): qty 1

## 2016-06-17 MED ORDER — ACETAMINOPHEN 650 MG RE SUPP: 650.0000 mg | Freq: Four times a day (QID) | RECTAL | Status: DC | PRN

## 2016-06-17 MED ORDER — LORAZEPAM 1 MG PO TABS
1.0000 mg | ORAL_TABLET | Freq: Four times a day (QID) | ORAL | Status: DC | PRN
  Administered 2016-06-18 – 2016-06-19 (×2): 1 mg via ORAL
  Filled 2016-06-17 (×3): qty 1

## 2016-06-17 MED ORDER — AMLODIPINE BESYLATE 5 MG PO TABS
5.0000 mg | ORAL_TABLET | Freq: Every day | ORAL | Status: DC
  Administered 2016-06-17: 5 mg via ORAL
  Filled 2016-06-17: qty 1

## 2016-06-17 MED ORDER — LORAZEPAM 2 MG/ML IJ SOLN: 1.0000 mg | Freq: Four times a day (QID) | INTRAMUSCULAR | Status: DC | PRN

## 2016-06-17 MED ORDER — ATENOLOL 50 MG PO TABS
50.0000 mg | ORAL_TABLET | Freq: Two times a day (BID) | ORAL | Status: DC
  Administered 2016-06-17 – 2016-06-19 (×6): 50 mg via ORAL
  Filled 2016-06-17 (×7): qty 1

## 2016-06-17 MED ORDER — PNEUMOCOCCAL VAC POLYVALENT 25 MCG/0.5ML IJ INJ
0.5000 mL | INJECTION | INTRAMUSCULAR | Status: AC
  Administered 2016-06-19: 0.5 mL via INTRAMUSCULAR
  Filled 2016-06-17: qty 0.5

## 2016-06-17 MED ORDER — NICARDIPINE HCL IN NACL 20-0.86 MG/200ML-% IV SOLN
3.0000 mg/h | INTRAVENOUS | Status: DC
  Administered 2016-06-17 (×4): 5 mg/h via INTRAVENOUS
  Filled 2016-06-17 (×5): qty 200

## 2016-06-17 MED ORDER — VITAMIN B-1 100 MG PO TABS
100.0000 mg | ORAL_TABLET | Freq: Every day | ORAL | Status: DC
  Administered 2016-06-17 – 2016-06-20 (×3): 100 mg via ORAL
  Filled 2016-06-17 (×4): qty 1

## 2016-06-17 MED ORDER — ASPIRIN 325 MG PO TABS
325.0000 mg | ORAL_TABLET | Freq: Every day | ORAL | Status: DC
  Administered 2016-06-17 – 2016-06-20 (×4): 325 mg via ORAL
  Filled 2016-06-17 (×4): qty 1

## 2016-06-17 MED ORDER — FOLIC ACID 1 MG PO TABS
1.0000 mg | ORAL_TABLET | Freq: Every day | ORAL | Status: DC
  Administered 2016-06-17 – 2016-06-20 (×4): 1 mg via ORAL
  Filled 2016-06-17 (×4): qty 1

## 2016-06-17 MED ORDER — SODIUM CHLORIDE 0.9% FLUSH
3.0000 mL | Freq: Two times a day (BID) | INTRAVENOUS | Status: DC
  Administered 2016-06-19 (×2): 3 mL via INTRAVENOUS

## 2016-06-17 MED ORDER — NITROGLYCERIN 0.4 MG SL SUBL: 0.4000 mg | SUBLINGUAL_TABLET | SUBLINGUAL | Status: DC | PRN

## 2016-06-17 MED ORDER — THIAMINE HCL 100 MG/ML IJ SOLN
100.0000 mg | Freq: Every day | INTRAMUSCULAR | Status: DC
Start: 2016-06-17 — End: 2016-06-20
  Administered 2016-06-19: 100 mg via INTRAVENOUS
  Filled 2016-06-17: qty 2

## 2016-06-17 MED ORDER — ACETAMINOPHEN 325 MG PO TABS
650.0000 mg | ORAL_TABLET | Freq: Four times a day (QID) | ORAL | Status: DC | PRN
  Administered 2016-06-18 – 2016-06-20 (×4): 650 mg via ORAL
  Filled 2016-06-17 (×4): qty 2

## 2016-06-17 MED ORDER — SODIUM CHLORIDE 0.9 % IV BOLUS (SEPSIS)
1000.0000 mL | Freq: Once | INTRAVENOUS | Status: AC
  Administered 2016-06-17: 1000 mL via INTRAVENOUS

## 2016-06-17 MED ORDER — SENNOSIDES-DOCUSATE SODIUM 8.6-50 MG PO TABS
1.0000 | ORAL_TABLET | Freq: Every evening | ORAL | Status: DC | PRN
  Filled 2016-06-17: qty 1

## 2016-06-17 MED ORDER — HEPARIN SODIUM (PORCINE) 5000 UNIT/ML IJ SOLN
5000.0000 [IU] | Freq: Three times a day (TID) | INTRAMUSCULAR | Status: DC
  Administered 2016-06-17 – 2016-06-20 (×9): 5000 [IU] via SUBCUTANEOUS
  Filled 2016-06-17 (×9): qty 1

## 2016-06-17 MED ORDER — ONDANSETRON HCL 4 MG PO TABS: 4.0000 mg | ORAL_TABLET | Freq: Four times a day (QID) | ORAL | Status: DC | PRN

## 2016-06-17 MED ORDER — SODIUM CHLORIDE 0.9 % IV SOLN
INTRAVENOUS | Status: DC
  Administered 2016-06-17: 15:00:00 via INTRAVENOUS
  Administered 2016-06-17: 1000 mL via INTRAVENOUS

## 2016-06-17 MED ORDER — ONDANSETRON HCL 4 MG/2ML IJ SOLN
4.0000 mg | Freq: Four times a day (QID) | INTRAMUSCULAR | Status: DC | PRN
Start: 2016-06-17 — End: 2016-06-20

## 2016-06-17 MED ORDER — LEVOFLOXACIN IN D5W 750 MG/150ML IV SOLN
750.0000 mg | Freq: Once | INTRAVENOUS | Status: AC
  Administered 2016-06-17: 750 mg via INTRAVENOUS
  Filled 2016-06-17: qty 150

## 2016-06-17 MED ORDER — POTASSIUM CHLORIDE CRYS ER 20 MEQ PO TBCR
40.0000 meq | EXTENDED_RELEASE_TABLET | Freq: Once | ORAL | Status: AC
  Administered 2016-06-17: 40 meq via ORAL
  Filled 2016-06-17: qty 2

## 2016-06-17 NOTE — Progress Notes (Signed)
  Echocardiogram 2D Echocardiogram has been performed.  Colin [name] 06/17/2016, 5:34 PM

## 2016-06-17 NOTE — ED Notes (Signed)
Pt does not know specifics regarding medical hx. States he had heart surgery in 2010, has been unable to follow up with private dr, per friend due to being unable to pay copay.

## 2016-06-17 NOTE — ED Provider Notes (Signed)
[facility] DEPT Provider Note   CSN: [id number] Arrival date & time: 06/17/16  0716     History   Chief Complaint Chief Complaint  Patient presents with  . Shortness of Breath    HPI Colin [name] is a 51 y.o. male.  Colin [name] is a 51 y.o. Male with a history of hypertension, descending thoracic aortic aneurysm, and an unknown cardiac surgery who presents to the ED complaining of 10 days of pain with deep inspiration. He reports feeling like his breath is "catching" with a deep breath. He denies any chest pain. He reports trouble sleeping last night due to his discomfort. He reports a history of hypertension, but has not been taking his blood pressure medicines for more than 3 months. He reports he has not been able to follow-up with his primary care provider. He reports he is due to fly to [place] tomorrow after his mother recently passed away. He most recently traveled from [place] 4 months ago. He reports slight cough starting today.  He denies fevers, hemoptysis, chest pain, back pain, neck pain, numbness, tingling, weakness, leg pain or leg swelling.   The history is provided by the patient and medical records. No language interpreter was used.  Shortness of Breath  Pertinent negatives include no fever, no headaches, no sore throat, no neck pain, no cough, no wheezing, no chest pain, no vomiting, no abdominal pain and no rash.    Past Medical History:  Diagnosis Date  . Descending aortic aneurysm ([place]) 2010   repaired by Dr. Gilford Raid  . ETOH abuse   . 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 Hypertension   . Thyroid nodule     Patient Active Problem List   Diagnosis Date Noted  . Hypertensive emergency 06/17/2016  . Acute kidney injury ([place]) 06/17/2016  . Hypokalemia 06/17/2016  . Elevated transaminase level 06/17/2016  . Elevated troponin 06/17/2016  . Dyspnea 06/17/2016  . Hypertension   . ETOH abuse   . Hyperparathyroidism, primary  ([place]) 05/15/2012  . Thyroid nodule, uninodular, left lobe 05/15/2012  . Descending aortic aneurysm ([place]) 03/20/2008    Past Surgical History:  Procedure Laterality Date  . CARDIAC SURGERY     pt unsure of actual Sx - knows it was due to "anurysm"  . THROAT SURGERY         Home Medications    Prior to Admission medications   Medication Sig Start Date End Date Taking? Authorizing Provider  atenolol (TENORMIN) 50 MG tablet Take 50 mg by mouth 2 (two) times daily.  06/02/13  Yes Historical Provider, MD    Family History Family History  Problem Relation Age of Onset  . Hypertension Father     Social History Social History  Substance Use Topics  . Smoking status: Never Smoker  . Smokeless tobacco: Never Used  . Alcohol use Yes     Comment: occ     Allergies   Patient has no known allergies.   Review of Systems Review of Systems  Constitutional: Negative for chills and fever.  HENT: Negative for congestion and sore throat.   Eyes: Negative for visual disturbance.  Respiratory: Positive for shortness of breath. Negative for cough and wheezing.   Cardiovascular: Negative for chest pain and palpitations.  Gastrointestinal: Negative for abdominal pain, diarrhea, nausea and vomiting.  Genitourinary: Negative for dysuria.  Musculoskeletal: Negative for back pain and neck pain.  Skin: Negative for rash.  Neurological: Negative for dizziness, light-headedness, numbness and headaches.  Physical Exam Updated Vital Signs BP (!) 153/93   Pulse 67   Temp 97.6 F (36.4 C) (Oral)   Resp (!) 21   Ht 5\' 10"  (1.778 m)   Wt 81.6 kg   SpO2 93%   BMI 25.83 kg/m   Physical Exam  Constitutional: He appears well-developed and well-nourished. No distress.  Nontoxic appearing.  HENT:  Head: Normocephalic and atraumatic.  Mouth/Throat: Oropharynx is clear and moist.  Eyes: Conjunctivae are normal. Pupils are equal, round, and reactive to light. Right eye exhibits no  discharge. Left eye exhibits no discharge.  Neck: Normal range of motion. Neck supple. No JVD present.  Cardiovascular: Normal rate, regular rhythm and intact distal pulses.  Exam reveals no gallop and no friction rub.   Murmur heard. Bilateral radial, posterior tibialis and dorsalis pedis pulses are intact.    Pulmonary/Chest: Effort normal and breath sounds normal. No stridor. No respiratory distress. He has no wheezes. He has no rales.  Lungs are clear to ascultation bilaterally. Symmetric chest expansion bilaterally. No increased work of breathing. No rales or rhonchi.  Resting comfortably in bed. Speaking in complete sentences.  Abdominal: Soft. There is no tenderness. There is no guarding.  Abdomen soft nontender to palpation.  Musculoskeletal: He exhibits no edema or tenderness.  No lower extremity edema or tenderness.  Lymphadenopathy:    He has no cervical adenopathy.  Neurological: He is alert. Coordination normal.  Skin: Skin is warm and dry. Capillary refill takes less than 2 seconds. No rash noted. He is not diaphoretic. No erythema. No pallor.  Psychiatric: He has a normal mood and affect. His behavior is normal.  Nursing note and vitals reviewed.    ED Treatments / Results  Labs (all labs ordered are listed, but only abnormal results are displayed) Labs Reviewed  CBC - Abnormal; Notable for the following:       Result Value   Hemoglobin 12.9 (*)    HCT 38.7 (*)    Platelets 133 (*)    All other components within normal limits  COMPREHENSIVE METABOLIC PANEL - Abnormal; Notable for the following:    Potassium 3.2 (*)    Glucose, Bld 117 (*)    BUN 49 (*)    Creatinine, Ser 4.60 (*)    Total Protein 6.0 (*)    Albumin 2.6 (*)    AST 213 (*)    ALT 243 (*)    Alkaline Phosphatase 173 (*)    GFR calc non Af Amer 13 (*)    GFR calc Af Amer 16 (*)    All other components within normal limits  BRAIN NATRIURETIC PEPTIDE - Abnormal; Notable for the following:    B  Natriuretic Peptide 1,743.8 (*)    All other components within normal limits  CREATININE, SERUM - Abnormal; Notable for the following:    Creatinine, Ser 4.19 (*)    GFR calc non Af Amer 15 (*)    GFR calc Af Amer 17 (*)    All other components within normal limits  TROPONIN I - Abnormal; Notable for the following:    Troponin I 0.11 (*)    All other components within normal limits  I-STAT TROPOININ, ED - Abnormal; Notable for the following:    Troponin i, poc 0.12 (*)    All other components within normal limits  MRSA PCR SCREENING  TSH  HIV ANTIBODY (ROUTINE TESTING)  RAPID URINE DRUG SCREEN, HOSP PERFORMED  URINALYSIS, ROUTINE W REFLEX MICROSCOPIC  TROPONIN I  TROPONIN I  BASIC METABOLIC PANEL  BASIC METABOLIC PANEL  I-STAT CG4 LACTIC ACID, ED    EKG  EKG Interpretation  Date/Time:  Saturday June 17 2016 07:34:34 EDT Ventricular Rate:  71 PR Interval:    QRS Duration: 111 QT Interval:  440 QTC Calculation: 479 R Axis:   97 Text Interpretation:  Sinus rhythm Prolonged PR interval Borderline right axis deviation Left ventricular hypertrophy Borderline T abnormalities, lateral leads Borderline prolonged QT interval t wave changes in lateral leads Confirmed by Gerald Leitz ([id number]) on 06/17/2016 7:37:42 AM       Radiology Ct Abdomen Pelvis Wo Contrast  Result Date: 06/17/2016 CLINICAL DATA:  Shortness of breath for 1 week. High blood pressure. EXAM: CT CHEST, ABDOMEN AND PELVIS WITHOUT CONTRAST TECHNIQUE: Multidetector CT imaging of the chest, abdomen and pelvis was performed following the standard protocol without IV contrast. COMPARISON:  October 30, 2008 CT scan FINDINGS: CT CHEST FINDINGS Cardiovascular: The patient's previously identified thoracic aortic aneurysm and dissection on the 2010 study has been repaired. The repaired ascending thoracic aorta measures up to 4.1 cm on series 3, image 34. The study cannot evaluate for dissection given the lack contrast. The  central pulmonary arteries are normal. The heart remains enlarged. Small bilateral pleural effusions. No pericardial effusion. Mediastinum/Nodes: There is a nodule in the left thyroid lobe measuring up to 2.1 cm, not of 20 seen on the August 2010 chest CT. The esophagus is normal. No adenopathy seen in the chest. Lungs/Pleura: The trachea and bronchi are well aerated. Bronchial wall thickening is identified, particularly in the right upper lobe and right lower lobe. Bilateral pulmonary opacities are identified, right greater than left. The bilateral pulmonary opacities are largely ground-glass with a crazy paving pattern on the right. Evaluation for pulmonary nodules and masses is limited due to the bilateral pulmonary opacities but no suspicious masses are identified. Small bilateral pleural effusions, right greater than left, are identified. Musculoskeletal: Sternotomy wires are grossly intact. No other acute bony abnormalities are identified. CT ABDOMEN PELVIS FINDINGS Hepatobiliary: No focal liver abnormality is seen. No gallstones, gallbladder wall thickening, or biliary dilatation. Pancreas: Unremarkable. No pancreatic ductal dilatation or surrounding inflammatory changes. Spleen: Normal in size without focal abnormality. Adrenals/Urinary Tract: No renal stones, masses, hydronephrosis, or perinephric stranding. A vascular calcification is seen in the left renal hilum. Note ureterectasis or ureteral stones. The bladder is unremarkable. Stomach/Bowel: The stomach and small bowel are normal. The colon and appendix are normal. Vascular/Lymphatic: The common iliac arteries measure 1.9 cm bilaterally. The abdominal aorta demonstrates scattered atherosclerotic change but no aneurysmal dilatation. No adenopathy. Reproductive: Prostate is unremarkable. Other: There is a small amount of free fluid in the pelvis. No free air identified. There is a fat containing umbilical hernia. Musculoskeletal: No acute bony  abnormalities identified. Mild trabecular thickening seen in the pelvic bones. IMPRESSION: 1. Bilateral pulmonary opacities are primarily ground-glass in nature with a crazy paving pattern on the right. The findings are nonspecific but in the setting of bilateral pleural effusions and cardiomegaly, pulmonary edema should be highly considered. Infectious or inflammatory processes are not completely excluded. 2. Repair of the patient's previous thoracic aortic aneurysm and dissection. The repaired aorta measures 4.1 cm in AP diameter. 3. 2.1 cm nodule in the left thyroid lobe. Recommend ultrasound for better evaluation. 4. There is a tiny amount of free fluid in the pelvis which is nonspecific. Electronically Signed   By: Dorise Bullion III M.D   On: 06/17/2016 10:05  Dg Chest 2 View  Result Date: 06/17/2016 CLINICAL DATA:  Short of breath this morning. EXAM: CHEST  2 VIEW COMPARISON:  01/12/2009 FINDINGS: There is bilateral patchy interstitial and alveolar airspace opacities predominantly at the lung bases. Trace bilateral pleural effusions. No pneumothorax. Stable cardiomegaly. Prior CABG. No acute osseous abnormality. IMPRESSION: Cardiomegaly with bilateral patchy interstitial and alveolar airspace opacities and trace pleural effusions. Differential considerations include pulmonary edema versus multilobar pneumonia. Electronically Signed   By: Kathreen Devoid   On: 06/17/2016 08:45   Ct Chest Wo Contrast  Result Date: 06/17/2016 CLINICAL DATA:  Shortness of breath for 1 week. High blood pressure. EXAM: CT CHEST, ABDOMEN AND PELVIS WITHOUT CONTRAST TECHNIQUE: Multidetector CT imaging of the chest, abdomen and pelvis was performed following the standard protocol without IV contrast. COMPARISON:  October 30, 2008 CT scan FINDINGS: CT CHEST FINDINGS Cardiovascular: The patient's previously identified thoracic aortic aneurysm and dissection on the 2010 study has been repaired. The repaired ascending thoracic aorta  measures up to 4.1 cm on series 3, image 34. The study cannot evaluate for dissection given the lack contrast. The central pulmonary arteries are normal. The heart remains enlarged. Small bilateral pleural effusions. No pericardial effusion. Mediastinum/Nodes: There is a nodule in the left thyroid lobe measuring up to 2.1 cm, not of 20 seen on the August 2010 chest CT. The esophagus is normal. No adenopathy seen in the chest. Lungs/Pleura: The trachea and bronchi are well aerated. Bronchial wall thickening is identified, particularly in the right upper lobe and right lower lobe. Bilateral pulmonary opacities are identified, right greater than left. The bilateral pulmonary opacities are largely ground-glass with a crazy paving pattern on the right. Evaluation for pulmonary nodules and masses is limited due to the bilateral pulmonary opacities but no suspicious masses are identified. Small bilateral pleural effusions, right greater than left, are identified. Musculoskeletal: Sternotomy wires are grossly intact. No other acute bony abnormalities are identified. CT ABDOMEN PELVIS FINDINGS Hepatobiliary: No focal liver abnormality is seen. No gallstones, gallbladder wall thickening, or biliary dilatation. Pancreas: Unremarkable. No pancreatic ductal dilatation or surrounding inflammatory changes. Spleen: Normal in size without focal abnormality. Adrenals/Urinary Tract: No renal stones, masses, hydronephrosis, or perinephric stranding. A vascular calcification is seen in the left renal hilum. Note ureterectasis or ureteral stones. The bladder is unremarkable. Stomach/Bowel: The stomach and small bowel are normal. The colon and appendix are normal. Vascular/Lymphatic: The common iliac arteries measure 1.9 cm bilaterally. The abdominal aorta demonstrates scattered atherosclerotic change but no aneurysmal dilatation. No adenopathy. Reproductive: Prostate is unremarkable. Other: There is a small amount of free fluid in the  pelvis. No free air identified. There is a fat containing umbilical hernia. Musculoskeletal: No acute bony abnormalities identified. Mild trabecular thickening seen in the pelvic bones. IMPRESSION: 1. Bilateral pulmonary opacities are primarily ground-glass in nature with a crazy paving pattern on the right. The findings are nonspecific but in the setting of bilateral pleural effusions and cardiomegaly, pulmonary edema should be highly considered. Infectious or inflammatory processes are not completely excluded. 2. Repair of the patient's previous thoracic aortic aneurysm and dissection. The repaired aorta measures 4.1 cm in AP diameter. 3. 2.1 cm nodule in the left thyroid lobe. Recommend ultrasound for better evaluation. 4. There is a tiny amount of free fluid in the pelvis which is nonspecific. Electronically Signed   By: Dorise Bullion III M.D   On: 06/17/2016 10:05    Procedures Procedures (including critical care time) CRITICAL CARE Performed by: Waynetta Pean  Duncan   Total critical care time: 45 minutes  Critical care time was exclusive of separately billable procedures and treating other patients.  Critical care was necessary to treat or prevent imminent or life-threatening deterioration.  Critical care was time spent personally by me on the following activities: development of treatment plan with patient and/or surrogate as well as nursing, discussions with consultants, evaluation of patient's response to treatment, examination of patient, obtaining history from patient or surrogate, ordering and performing treatments and interventions, ordering and review of laboratory studies, ordering and review of radiographic studies, pulse oximetry and re-evaluation of patient's condition.  Medications Ordered in ED Medications  nicardipine (CARDENE) 20mg  in 0.86% saline 238ml IV infusion (0.1 mg/ml) (5 mg/hr Intravenous New Bag/Given 06/17/16 1536)  atenolol (TENORMIN) tablet 50 mg (50 mg Oral  Given 06/17/16 1242)  heparin injection 5,000 Units (5,000 Units Subcutaneous Given 06/17/16 1443)  sodium chloride flush (NS) 0.9 % injection 3 mL (not administered)  0.9 %  sodium chloride infusion ( Intravenous New Bag/Given 06/17/16 1447)  acetaminophen (TYLENOL) tablet 650 mg (not administered)    Or  acetaminophen (TYLENOL) suppository 650 mg (not administered)  senna-docusate (Senokot-S) tablet 1 tablet (not administered)  ondansetron (ZOFRAN) tablet 4 mg (not administered)    Or  ondansetron (ZOFRAN) injection 4 mg (not administered)  amLODipine (NORVASC) tablet 5 mg (5 mg Oral Given 06/17/16 1443)  LORazepam (ATIVAN) tablet 1 mg (not administered)    Or  LORazepam (ATIVAN) injection 1 mg (not administered)  thiamine (VITAMIN B-1) tablet 100 mg (100 mg Oral Given 06/17/16 1443)    Or  thiamine (B-1) injection 100 mg ( Intravenous See Alternative 06/18/00 7253)  folic acid (FOLVITE) tablet 1 mg (1 mg Oral Given 06/17/16 1443)  multivitamin with minerals tablet 1 tablet (1 tablet Oral Given 06/17/16 1444)  aspirin tablet 325 mg (325 mg Oral Given 06/17/16 1536)  nitroGLYCERIN (NITROSTAT) SL tablet 0.4 mg (not administered)  sodium chloride 0.9 % bolus 1,000 mL (0 mLs Intravenous Stopped 06/17/16 1030)  levofloxacin (LEVAQUIN) IVPB 750 mg (750 mg Intravenous New Bag/Given 06/17/16 1259)  potassium chloride SA (K-DUR,KLOR-CON) CR tablet 40 mEq (40 mEq Oral Given 06/17/16 1443)     Initial Impression / Assessment and Plan / ED Course  I have reviewed the triage vital signs and the nursing notes.  Pertinent labs & imaging results that were available during my care of the patient were reviewed by me and considered in my medical decision making (see chart for details).    This is a 51 y.o. Male with a history of hypertension, descending thoracic aortic aneurysm, and an unknown cardiac surgery who presents to the ED complaining of 10 days of pain with deep inspiration. He reports feeling like  his breath is "catching" with a deep breath. He denies any chest pain. He reports trouble sleeping last night due to his discomfort. He reports a history of hypertension, but has not been taking his blood pressure medicines for more than 3 months. He reports he has not been able to follow-up with his primary care provider. He reports he is due to fly to [place] tomorrow after his mother recently passed away. He most recently traveled from [place] 4 months ago. He reports slight cough starting today.  On exam the patient is afebrile nontoxic appearing. He is resting comfortably in the bed. Lungs clear to auscultation bilaterally. Abdomen is soft and nontender to palpation. He is neurovascularly intact. No focal neurological deficits. He is very hypertensive  at 195/136. EKG shows some borderline T-wave abnormalities and some borderline QT prolongation. No evidence of STEMI on EKG. Patient's troponin returned elevated at 0.12. CMP reveals an acute kidney injury with a creatinine of 4.60.  Concerns the patient is having a dissection or problems with his aneurysm repair. Would like to obtain dissection study, however creatinine is elevated.  Dr. Thomasene Lot consulted with radiology who would like CT without contrast of his chest, abdomen and pelvis and will use this to rule out dissection.    Patient started on a Cardene drip for his blood pressure with a goal map of 114. This is a 25% reduction in his map currently.  CT scan showed bilateral pulmonary opacities that are primarily groundglass in nature with a crazy paving pattern on the right. There are nonspecific findings but are concerning for pulmonary edema or infectious process. Will cover with antibiotics for possible infectious process. Suspect pulmonary edema due to this patient's hypertensive emergency. I suspect this is contributing to his elevated troponin. There is no evidence of dissection or aneurysm at this time. No aneurysmal dilation.  Patient's  blood pressure improved greatly with Cardene drip. Suspect elevation of troponin is due to his hypertensive emergency. Patient's elevated creatinine is also likely due to his hypertension. Will admit to medicine. Patient agrees with plan for admission.  Patient excepted for admission by [facility] hospitalist.  This patient was discussed with and evaluated by Dr. Thomasene Lot who agrees with assessment and plan.    Final Clinical Impressions(s) / ED Diagnoses   Final diagnoses:  Hypertensive emergency  Elevated troponin  Pleuritic pain    New Prescriptions Current Discharge Medication List       Waynetta Pean, PA-C 06/17/16 [place], MD 06/18/16 [phone]

## 2016-06-17 NOTE — H&P (Signed)
History and Physical    Colin [name] RKY:[id number] DOB: 03-01-1966 DOA: 06/17/2016  PCP: Henrine Screws, MD Patient coming from: home  Chief Complaint: sob  HPI: Colin [name] is a very pleasant 51 y.o. 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 Initial evaluation in the emergency department reveals hypertensive emergency, acute kidney injury, hypokalemia elevated transaminase, elevated proBNP.  Information is obtained from the patient and his brother who is at the bedside. Patient reports intermittent shortness of breath with activity for "quite a while". However late he states the shortness of breath is worse and happening at rest. He states he feels like he can't "catch" a deep breath. Associated symptoms include dry cough intermittent headaches chest pain with deep inspiration. He denies fever chills abdominal pain nausea vomiting diarrhea constipation. He denies dysuria hematuria frequency or urgency. He denies lower extremity edema or orthopnea. He states that in the past he has been diagnosed with high blood pressure and he was prescribed amlodipine, losartan, hydrochlorothiazide and atenolol. He has been only taking the atenolol and he's been taking that intermittently.    ED Course: In the emergency department he is afebrile with a blood pressure of 195/136. No tachycardia no tachypnea he is not hypoxic. Provided with nicardinpine gtt. at time of admission pressure is 148/94.  Review of Systems: As per HPI otherwise 10 point review of systems negative.   Ambulatory Status: He ambulates independently works full-time night shift at [employer]  Past Medical History:  Diagnosis Date  . Descending aortic aneurysm ([place]) 2010   repaired by Dr. Gilford Raid  . ETOH abuse   . 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 Hypertension   . Thyroid nodule     Past Surgical History:  Procedure Laterality Date  . CARDIAC SURGERY     pt unsure of actual Sx - knows it was due to "anurysm"  . THROAT SURGERY      Social History   Social History  . Marital status: Married    Spouse name: N/A  . Number of children: N/A  . Years of education: N/A   Occupational History  . Not on file.   Social History Main Topics  . Smoking status: Never Smoker  . Smokeless tobacco: Never Used  . Alcohol use Yes     Comment: occ  . Drug use: No  . Sexual activity: Not on file   Other Topics Concern  . Not on file   Social History Narrative  . No narrative on file   He lives at home alone. His brother lives in town. He has 2 children in the area as well. He does not smoke but he drinks No Known Allergies  Family History  Problem Relation Age of Onset  . Hypertension Father     Prior to Admission medications   Medication Sig Start Date End Date Taking? Authorizing Provider  atenolol (TENORMIN) 50 MG tablet Take 50 mg by mouth 2 (two) times daily.  06/02/13  Yes Historical Provider, MD    Physical Exam: Vitals:   06/17/16 1045 06/17/16 1100 06/17/16 1115 06/17/16 1130  BP: (!) 152/94 (!) 148/89 (!) 149/91 (!) 148/94  Pulse: 73 70 71 72  Resp:  12 16 (!) 26  Temp:      TempSrc:      SpO2: 93% 92% 95% 93%  Weight:  Height:         General:  Appears calm and comfortable sitting up in bed Eyes:  PERRL, EOMI, normal lids, iris. Some scleral icterus ENT:  grossly normal hearing, lips & tongue, because membranes of his mouth are pink slightly dry Neck:  no LAD, masses or thyromegaly Cardiovascular:  RRR, no m/r/g. No LE edema. Pedal pulses present and palpable Respiratory:  CTA bilaterally but somewhat diminished. no w/r/r. Normal respiratory effort. Abdomen:  soft, ntnd, positive bowel sounds no guarding or rebounding Skin:  no rash or induration seen on limited  exam Musculoskeletal:  grossly normal tone BUE/BLE, good ROM, no bony abnormality Psychiatric:  grossly normal mood and affect, speech fluent and appropriate, AOx3 Neurologic:  CN 2-12 grossly intact, moves all extremities in coordinated fashion, sensation intact  Labs on Admission: I have personally reviewed following labs and imaging studies  CBC:  Recent Labs Lab 06/17/16 0748  WBC 5.0  HGB 12.9*  HCT 38.7*  MCV 84.7  PLT 650*   Basic Metabolic Panel:  Recent Labs Lab 06/17/16 0748  NA 135  K 3.2*  CL 104  CO2 23  GLUCOSE 117*  BUN 49*  CREATININE 4.60*  CALCIUM 9.8   GFR: Estimated Creatinine Clearance: 19.4 mL/min (A) (by C-G formula based on SCr of 4.6 mg/dL (H)). Liver Function Tests:  Recent Labs Lab 06/17/16 0748  AST 213*  ALT 243*  ALKPHOS 173*  BILITOT 0.5  PROT 6.0*  ALBUMIN 2.6*   No results for input(s): LIPASE, AMYLASE in the last 168 hours. No results for input(s): AMMONIA in the last 168 hours. Coagulation Profile: No results for input(s): INR, PROTIME in the last 168 hours. Cardiac Enzymes: No results for input(s): CKTOTAL, CKMB, CKMBINDEX, TROPONINI in the last 168 hours. BNP (last 3 results) No results for input(s): PROBNP in the last 8760 hours. HbA1C: No results for input(s): HGBA1C in the last 72 hours. CBG: No results for input(s): GLUCAP in the last 168 hours. Lipid Profile: No results for input(s): CHOL, HDL, LDLCALC, TRIG, CHOLHDL, LDLDIRECT in the last 72 hours. Thyroid Function Tests: No results for input(s): TSH, T4TOTAL, FREET4, T3FREE, THYROIDAB in the last 72 hours. Anemia Panel: No results for input(s): VITAMINB12, FOLATE, FERRITIN, TIBC, IRON, RETICCTPCT in the last 72 hours. Urine analysis:    Component Value Date/Time   COLORURINE YELLOW 11/17/2008 1611   APPEARANCEUR CLEAR 11/17/2008 1611   LABSPEC 1.020 11/17/2008 1611   PHURINE 5.5 11/17/2008 1611   GLUCOSEU NEGATIVE 11/17/2008 1611   HGBUR NEGATIVE  11/17/2008 1611   BILIRUBINUR NEGATIVE 11/17/2008 1611   KETONESUR NEGATIVE 11/17/2008 1611   PROTEINUR NEGATIVE 11/17/2008 1611   UROBILINOGEN 0.2 11/17/2008 1611   NITRITE NEGATIVE 11/17/2008 1611   LEUKOCYTESUR NEGATIVE 11/17/2008 1611    Creatinine Clearance: Estimated Creatinine Clearance: 19.4 mL/min (A) (by C-G formula based on SCr of 4.6 mg/dL (H)).  Sepsis Labs: @LABRCNTIP (procalcitonin:4,lacticidven:4) )No results found for this or any previous visit (from the past 240 hour(s)).   Radiological Exams on Admission: Ct Abdomen Pelvis Wo Contrast  Result Date: 06/17/2016 CLINICAL DATA:  Shortness of breath for 1 week. High blood pressure. EXAM: CT CHEST, ABDOMEN AND PELVIS WITHOUT CONTRAST TECHNIQUE: Multidetector CT imaging of the chest, abdomen and pelvis was performed following the standard protocol without IV contrast. COMPARISON:  October 30, 2008 CT scan FINDINGS: CT CHEST FINDINGS Cardiovascular: The patient's previously identified thoracic aortic aneurysm and dissection on the 2010 study has been repaired. The repaired ascending thoracic aorta measures  up to 4.1 cm on series 3, image 34. The study cannot evaluate for dissection given the lack contrast. The central pulmonary arteries are normal. The heart remains enlarged. Small bilateral pleural effusions. No pericardial effusion. Mediastinum/Nodes: There is a nodule in the left thyroid lobe measuring up to 2.1 cm, not of 20 seen on the August 2010 chest CT. The esophagus is normal. No adenopathy seen in the chest. Lungs/Pleura: The trachea and bronchi are well aerated. Bronchial wall thickening is identified, particularly in the right upper lobe and right lower lobe. Bilateral pulmonary opacities are identified, right greater than left. The bilateral pulmonary opacities are largely ground-glass with a crazy paving pattern on the right. Evaluation for pulmonary nodules and masses is limited due to the bilateral pulmonary opacities but  no suspicious masses are identified. Small bilateral pleural effusions, right greater than left, are identified. Musculoskeletal: Sternotomy wires are grossly intact. No other acute bony abnormalities are identified. CT ABDOMEN PELVIS FINDINGS Hepatobiliary: No focal liver abnormality is seen. No gallstones, gallbladder wall thickening, or biliary dilatation. Pancreas: Unremarkable. No pancreatic ductal dilatation or surrounding inflammatory changes. Spleen: Normal in size without focal abnormality. Adrenals/Urinary Tract: No renal stones, masses, hydronephrosis, or perinephric stranding. A vascular calcification is seen in the left renal hilum. Note ureterectasis or ureteral stones. The bladder is unremarkable. Stomach/Bowel: The stomach and small bowel are normal. The colon and appendix are normal. Vascular/Lymphatic: The common iliac arteries measure 1.9 cm bilaterally. The abdominal aorta demonstrates scattered atherosclerotic change but no aneurysmal dilatation. No adenopathy. Reproductive: Prostate is unremarkable. Other: There is a small amount of free fluid in the pelvis. No free air identified. There is a fat containing umbilical hernia. Musculoskeletal: No acute bony abnormalities identified. Mild trabecular thickening seen in the pelvic bones. IMPRESSION: 1. Bilateral pulmonary opacities are primarily ground-glass in nature with a crazy paving pattern on the right. The findings are nonspecific but in the setting of bilateral pleural effusions and cardiomegaly, pulmonary edema should be highly considered. Infectious or inflammatory processes are not completely excluded. 2. Repair of the patient's previous thoracic aortic aneurysm and dissection. The repaired aorta measures 4.1 cm in AP diameter. 3. 2.1 cm nodule in the left thyroid lobe. Recommend ultrasound for better evaluation. 4. There is a tiny amount of free fluid in the pelvis which is nonspecific. Electronically Signed   By: Dorise Bullion III M.D    On: 06/17/2016 10:05   Dg Chest 2 View  Result Date: 06/17/2016 CLINICAL DATA:  Short of breath this morning. EXAM: CHEST  2 VIEW COMPARISON:  01/12/2009 FINDINGS: There is bilateral patchy interstitial and alveolar airspace opacities predominantly at the lung bases. Trace bilateral pleural effusions. No pneumothorax. Stable cardiomegaly. Prior CABG. No acute osseous abnormality. IMPRESSION: Cardiomegaly with bilateral patchy interstitial and alveolar airspace opacities and trace pleural effusions. Differential considerations include pulmonary edema versus multilobar pneumonia. Electronically Signed   By: Kathreen Devoid   On: 06/17/2016 08:45   Ct Chest Wo Contrast  Result Date: 06/17/2016 CLINICAL DATA:  Shortness of breath for 1 week. High blood pressure. EXAM: CT CHEST, ABDOMEN AND PELVIS WITHOUT CONTRAST TECHNIQUE: Multidetector CT imaging of the chest, abdomen and pelvis was performed following the standard protocol without IV contrast. COMPARISON:  October 30, 2008 CT scan FINDINGS: CT CHEST FINDINGS Cardiovascular: The patient's previously identified thoracic aortic aneurysm and dissection on the 2010 study has been repaired. The repaired ascending thoracic aorta measures up to 4.1 cm on series 3, image 34. The study cannot  evaluate for dissection given the lack contrast. The central pulmonary arteries are normal. The heart remains enlarged. Small bilateral pleural effusions. No pericardial effusion. Mediastinum/Nodes: There is a nodule in the left thyroid lobe measuring up to 2.1 cm, not of 20 seen on the August 2010 chest CT. The esophagus is normal. No adenopathy seen in the chest. Lungs/Pleura: The trachea and bronchi are well aerated. Bronchial wall thickening is identified, particularly in the right upper lobe and right lower lobe. Bilateral pulmonary opacities are identified, right greater than left. The bilateral pulmonary opacities are largely ground-glass with a crazy paving pattern on the  right. Evaluation for pulmonary nodules and masses is limited due to the bilateral pulmonary opacities but no suspicious masses are identified. Small bilateral pleural effusions, right greater than left, are identified. Musculoskeletal: Sternotomy wires are grossly intact. No other acute bony abnormalities are identified. CT ABDOMEN PELVIS FINDINGS Hepatobiliary: No focal liver abnormality is seen. No gallstones, gallbladder wall thickening, or biliary dilatation. Pancreas: Unremarkable. No pancreatic ductal dilatation or surrounding inflammatory changes. Spleen: Normal in size without focal abnormality. Adrenals/Urinary Tract: No renal stones, masses, hydronephrosis, or perinephric stranding. A vascular calcification is seen in the left renal hilum. Note ureterectasis or ureteral stones. The bladder is unremarkable. Stomach/Bowel: The stomach and small bowel are normal. The colon and appendix are normal. Vascular/Lymphatic: The common iliac arteries measure 1.9 cm bilaterally. The abdominal aorta demonstrates scattered atherosclerotic change but no aneurysmal dilatation. No adenopathy. Reproductive: Prostate is unremarkable. Other: There is a small amount of free fluid in the pelvis. No free air identified. There is a fat containing umbilical hernia. Musculoskeletal: No acute bony abnormalities identified. Mild trabecular thickening seen in the pelvic bones. IMPRESSION: 1. Bilateral pulmonary opacities are primarily ground-glass in nature with a crazy paving pattern on the right. The findings are nonspecific but in the setting of bilateral pleural effusions and cardiomegaly, pulmonary edema should be highly considered. Infectious or inflammatory processes are not completely excluded. 2. Repair of the patient's previous thoracic aortic aneurysm and dissection. The repaired aorta measures 4.1 cm in AP diameter. 3. 2.1 cm nodule in the left thyroid lobe. Recommend ultrasound for better evaluation. 4. There is a tiny  amount of free fluid in the pelvis which is nonspecific. Electronically Signed   By: Dorise Bullion III M.D   On: 06/17/2016 10:05    EKG: Independently reviewed. Sinus rhythm Prolonged PR interval Borderline right axis deviation Left ventricular hypertrophy Borderline T abnormalities, lateral leads Borderline prolonged QT interval  Assessment/Plan Principal Problem:   Hypertensive emergency Active Problems:   Hyperparathyroidism, primary ([place])   Acute kidney injury ([place])   Descending aortic aneurysm ([facility])   Hypokalemia   Elevated transaminase level   Elevated troponin   Dyspnea    1. Hypertension emergency. Likely related to noncompliance. Patient states in the past he has been on 4 antihypertensive medications but has not taken any in the last 3 or 4 months and then he was only taking 1.  -Admit to step down -continue nicardipine gtt -resume home atenolol -resume home amlodipine -titrate nicardipine gtt -hold HCTZ and losartan for now -obtain echo -monitor closely -will need close OP follow up with PCP  2. Acute kidney injury. I suspect is a chronic component to this given what appears to be a very difficult to control blood pressure. To what degree is unknown. Creatinine 4.6 on admission. -gently IV fluids -hold nephrotoxins -monitor urine output -recheck in the am.   3. Elevated troponin/bnp. Likely  related above. Patient with shortness of breath and chest pain with inspiration. CT chest with bilateral pulmonary a pacer these are nonspecific but consistent with an area edema. BNP is elevated as well. His saturation level greater than 90% on room air.  -Cycle troponin -Serial EKG -Obtain a 2-D echo -Blood pressure control  #4. Elevated transaminase likely related to TRH abuse. He reports drinking at least a sixpack of beer 3 or 4 nights a week every week. AST 213 ALT 243 alkaline phosphatase 173 total bili 0.5. CT of the abdomen pelvis no focal liver abnormality. No signs  symptoms of withdrawal -See above -Advised to abstain EtOH consumption -CIWA  #5.dyspnea. Likely related to above. CT as noted above. Chest x-ray yields cardiomegaly opacities and trace pleural effusions He is afebrile lactic acid is within the limits of normal. Non -toxic appearing. Surgeon saturation level greater than 90% on room air breath sounds are diminished on exam. -see above -monitor oxygen saturation level -echo    DVT prophylaxis: heparin Code Status: full  Family Communication: brother at bedside  Disposition Plan: home  Consults called: none  Admission status: inpatient    Radene Gunning MD [facility]  If 7PM-7AM, please contact night-coverage [email or web address] Password [facility]  06/17/2016, 12:04 PM

## 2016-06-17 NOTE — ED Triage Notes (Signed)
Pt c/o short of breath- has been unable to get a "big breath" for "awhile" -- pt is supposed to fly to [place] in morning. Also has a hx of AORTIC ANEURYSM.

## 2016-06-17 NOTE — ED Notes (Signed)
Transferred to 4E 11.

## 2016-06-18 DIAGNOSIS — R748 Abnormal levels of other serum enzymes: Secondary | ICD-10-CM

## 2016-06-18 DIAGNOSIS — I161 Hypertensive emergency: Principal | ICD-10-CM

## 2016-06-18 DIAGNOSIS — R740 Nonspecific elevation of levels of transaminase and lactic acid dehydrogenase [LDH]: Secondary | ICD-10-CM

## 2016-06-18 DIAGNOSIS — N179 Acute kidney failure, unspecified: Secondary | ICD-10-CM

## 2016-06-18 LAB — ECHOCARDIOGRAM COMPLETE
E decel time: 123 msec
E/e' ratio: 21.33
FS: 22 % — AB (ref 28–44)
Height: 70 in
IVS/LV PW RATIO, ED: 0.88
LA ID, A-P, ES: 49 mm
LA diam end sys: 49 mm
LADIAMINDEX: 2.43 cm/m2
LAVOL: 123 mL
LAVOLA4C: 126 mL
LAVOLIN: 60.9 mL/m2
LVEEAVG: 21.33
LVEEMED: 21.33
LVELAT: 6.94 cm/s
LVOT SV: 67 mL
LVOT VTI: 17.6 cm
LVOT area: 3.8 cm2
LVOT diameter: 22 mm
LVOT peak vel: 104 cm/s
Lateral S' vel: 9.41 cm/s
MV Dec: 123
MVPG: 9 mmHg
MVPKAVEL: 72.7 m/s
MVPKEVEL: 148 m/s
PW: 14.2 mm — AB (ref 0.6–1.1)
RV TAPSE: 17.8 mm
TDI e' lateral: 6.94
TDI e' medial: 5.63
WEIGHTICAEL: 2880 [oz_av]

## 2016-06-18 LAB — PHOSPHORUS: Phosphorus: 2.7 mg/dL (ref 2.5–4.6)

## 2016-06-18 LAB — TROPONIN I
Troponin I: 0.11 ng/mL (ref <0.03)
Troponin I: 0.11 ng/mL (ref <0.03)

## 2016-06-18 LAB — URINALYSIS, ROUTINE W REFLEX MICROSCOPIC
BACTERIA UA: NONE SEEN
BILIRUBIN URINE: NEGATIVE
Glucose, UA: NEGATIVE mg/dL
HGB URINE DIPSTICK: NEGATIVE
KETONES UR: NEGATIVE mg/dL
Leukocytes, UA: NEGATIVE
NITRITE: NEGATIVE
PROTEIN: 100 mg/dL — AB
SPECIFIC GRAVITY, URINE: 1.008 (ref 1.005–1.030)
Squamous Epithelial / HPF: NONE SEEN
WBC UA: NONE SEEN WBC/hpf (ref 0–5)
pH: 6 (ref 5.0–8.0)

## 2016-06-18 LAB — COMPREHENSIVE METABOLIC PANEL
ALK PHOS: 146 U/L — AB (ref 38–126)
ALT: 168 U/L — AB (ref 17–63)
ANION GAP: 9 (ref 5–15)
AST: 88 U/L — ABNORMAL HIGH (ref 15–41)
Albumin: 2.3 g/dL — ABNORMAL LOW (ref 3.5–5.0)
BILIRUBIN TOTAL: 0.5 mg/dL (ref 0.3–1.2)
BUN: 44 mg/dL — ABNORMAL HIGH (ref 6–20)
CALCIUM: 9.8 mg/dL (ref 8.9–10.3)
CO2: 21 mmol/L — ABNORMAL LOW (ref 22–32)
CREATININE: 4.22 mg/dL — AB (ref 0.61–1.24)
Chloride: 104 mmol/L (ref 101–111)
GFR calc non Af Amer: 15 mL/min — ABNORMAL LOW (ref >60)
GFR, EST AFRICAN AMERICAN: 17 mL/min — AB (ref >60)
GLUCOSE: 109 mg/dL — AB (ref 65–99)
Potassium: 3.2 mmol/L — ABNORMAL LOW (ref 3.5–5.1)
SODIUM: 134 mmol/L — AB (ref 135–145)
TOTAL PROTEIN: 5.8 g/dL — AB (ref 6.5–8.1)

## 2016-06-18 LAB — CBC
HCT: 36.7 % — ABNORMAL LOW (ref 39.0–52.0)
Hemoglobin: 12.4 g/dL — ABNORMAL LOW (ref 13.0–17.0)
MCH: 28.7 pg (ref 26.0–34.0)
MCHC: 33.8 g/dL (ref 30.0–36.0)
MCV: 85 fL (ref 78.0–100.0)
Platelets: 131 10*3/uL — ABNORMAL LOW (ref 150–400)
RBC: 4.32 MIL/uL (ref 4.22–5.81)
RDW: 13.8 % (ref 11.5–15.5)
WBC: 5.7 10*3/uL (ref 4.0–10.5)

## 2016-06-18 LAB — BASIC METABOLIC PANEL
Anion gap: 8 (ref 5–15)
BUN: 45 mg/dL — AB (ref 6–20)
CHLORIDE: 105 mmol/L (ref 101–111)
CO2: 22 mmol/L (ref 22–32)
CREATININE: 4.22 mg/dL — AB (ref 0.61–1.24)
Calcium: 9.9 mg/dL (ref 8.9–10.3)
GFR calc Af Amer: 17 mL/min — ABNORMAL LOW (ref >60)
GFR calc non Af Amer: 15 mL/min — ABNORMAL LOW (ref >60)
Glucose, Bld: 111 mg/dL — ABNORMAL HIGH (ref 65–99)
POTASSIUM: 3.2 mmol/L — AB (ref 3.5–5.1)
SODIUM: 135 mmol/L (ref 135–145)

## 2016-06-18 LAB — RAPID URINE DRUG SCREEN, HOSP PERFORMED
Amphetamines: NOT DETECTED
Barbiturates: NOT DETECTED
Benzodiazepines: NOT DETECTED
COCAINE: NOT DETECTED
OPIATES: NOT DETECTED
TETRAHYDROCANNABINOL: NOT DETECTED

## 2016-06-18 LAB — MAGNESIUM: Magnesium: 2.1 mg/dL (ref 1.7–2.4)

## 2016-06-18 LAB — HIV ANTIBODY (ROUTINE TESTING W REFLEX): HIV SCREEN 4TH GENERATION: NONREACTIVE

## 2016-06-18 MED ORDER — POTASSIUM CHLORIDE CRYS ER 20 MEQ PO TBCR
40.0000 meq | EXTENDED_RELEASE_TABLET | Freq: Once | ORAL | Status: AC
  Administered 2016-06-18: 40 meq via ORAL
  Filled 2016-06-18: qty 2

## 2016-06-18 MED ORDER — NITROGLYCERIN IN D5W 200-5 MCG/ML-% IV SOLN
0.0000 ug/min | INTRAVENOUS | Status: DC
  Administered 2016-06-18: 5 ug/min via INTRAVENOUS
  Filled 2016-06-18: qty 250

## 2016-06-18 MED ORDER — HYDRALAZINE HCL 25 MG PO TABS
25.0000 mg | ORAL_TABLET | Freq: Three times a day (TID) | ORAL | Status: DC
  Administered 2016-06-18: 25 mg via ORAL
  Filled 2016-06-18: qty 1

## 2016-06-18 MED ORDER — AMLODIPINE BESYLATE 10 MG PO TABS
10.0000 mg | ORAL_TABLET | Freq: Every day | ORAL | Status: DC
  Administered 2016-06-18 – 2016-06-20 (×3): 10 mg via ORAL
  Filled 2016-06-18 (×3): qty 1

## 2016-06-18 MED ORDER — HYDRALAZINE HCL 50 MG PO TABS
50.0000 mg | ORAL_TABLET | Freq: Three times a day (TID) | ORAL | Status: DC
  Administered 2016-06-18 – 2016-06-20 (×6): 50 mg via ORAL
  Filled 2016-06-18 (×6): qty 1

## 2016-06-18 NOTE — Progress Notes (Addendum)
PROGRESS NOTE    Colin [name]  QIW:[id number] DOB: 19-May-1965 DOA: 06/17/2016 PCP: Henrine Screws, MD   Outpatient Specialists:     Brief Narrative:  Colin [name] is a 51 y.o. male with a Past Medical History significant for alcohol abuse, aortic aneurysm, hypertension, thyroid nodule who presents with sob. Dx HTN emergency with multiorgan failure. Patient states his mother died recently and the funeral is in [place] this weekend. Patient is hopeful to be able to fly out for the funeral.  Assessment & Plan:   Principal Problem:   Hypertensive emergency Active Problems:   Hyperparathyroidism, primary ([place])   Acute kidney injury ([place])   Descending aortic aneurysm ([place])   Hypokalemia   Elevated transaminase level   Elevated troponin   Dyspnea   Hypertensive emergency Patient has only been taking one of his for antihypertensive medications Patient was admitted to stepdown unit Patient was started on a nitro drip Have resumed patient's atenolol and resumed patient's amlodipine at a higher dose as well as added hydralazine Echo pending  Acute kidney injury versus chronic kidney disease Patient states he has seen [facility] in the last 3 years Patient is not sure of his baseline creatinine Daily labs UA pending  Flat troponin and elevated BNP Suspect due to acute kidney injury and hypertensive emergency  Elevated transaminase amylases Suspect related to alcohol Trend CIWA  Shortness of breath Resolved  Hypokalemia Replete by mouth Magnesium normal  Alcohol abuse CIWA Encouraged cessation  Thyroid nodule -outpatient U/S  DVT prophylaxis:  SQ Heparin  Code Status: Full Code   Family Communication: none  Disposition Plan:  tx to tele once off nitro gtt   Consultants:         Subjective:  No headache  no shortness of breath  Objective: Vitals:   06/18/16 0300 06/18/16 0500 06/18/16 0700 06/18/16 0737   BP: (!) 151/96 (!) 155/100 (!) 146/96 (!) 146/96  Pulse:  72 72 76  Resp: (!) 24 18 18 18   Temp:    97.8 F (36.6 C)  TempSrc:    Oral  SpO2:  90% (!) 58% 98%  Weight:  87.3 kg (192 lb 7.4 oz)    Height:        Intake/Output Summary (Last 24 hours) at 06/18/16 0916 Last data filed at 06/18/16 0819  Gross per 24 hour  Intake          2047.61 ml  Output             4500 ml  Net         -2452.39 ml   Filed Weights   06/17/16 0734 06/18/16 0500  Weight: 81.6 kg (180 lb) 87.3 kg (192 lb 7.4 oz)    Examination:  General exam: Appears calm and comfortable  Respiratory system: Clear to auscultation. Respiratory effort normal. Cardiovascular system: S1 & S2 heard, RRR. No JVD, murmurs, rubs, gallops or clicks. No pedal edema. Gastrointestinal system: Abdomen is nondistended, soft and nontender. No organomegaly or masses felt. Normal bowel sounds heard. Central nervous system: Alert and oriented. No focal neurological deficits. Extremities: Symmetric 5 x 5 power. Skin: No rashes, lesions or ulcers Psychiatry: Judgement and insight appear normal. Mood & affect appropriate.     Data Reviewed: I have personally reviewed following labs and imaging studies  CBC:  Recent Labs Lab 06/17/16 0748 06/18/16 0232  WBC 5.0 5.7  HGB 12.9* 12.4*  HCT 38.7* 36.7*  MCV 84.7 85.0  PLT 133* 131*  Basic Metabolic Panel:  Recent Labs Lab 06/17/16 0748 06/17/16 1113 06/17/16 1916 06/18/16 0232  NA 135  --  136 134*  135  K 3.2*  --  3.7 3.2*  3.2*  CL 104  --  108 104  105  CO2 23  --  16* 21*  22  GLUCOSE 117*  --  124* 109*  111*  BUN 49*  --  49* 44*  45*  CREATININE 4.60* 4.19* 4.17* 4.22*  4.22*  CALCIUM 9.8  --  9.5 9.8  9.9  MG  --   --   --  2.1  PHOS  --   --   --  2.7   GFR: Estimated Creatinine Clearance: 21.1 mL/min (A) (by C-G formula based on SCr of 4.22 mg/dL (H)). Liver Function Tests:  Recent Labs Lab 06/17/16 0748 06/18/16 0232  AST 213* 88*    ALT 243* 168*  ALKPHOS 173* 146*  BILITOT 0.5 0.5  PROT 6.0* 5.8*  ALBUMIN 2.6* 2.3*   No results for input(s): LIPASE, AMYLASE in the last 168 hours. No results for input(s): AMMONIA in the last 168 hours. Coagulation Profile: No results for input(s): INR, PROTIME in the last 168 hours. Cardiac Enzymes:  Recent Labs Lab 06/17/16 1419 06/17/16 1839 06/18/16 0232  TROPONINI 0.11* 0.11* 0.11*  0.11*   BNP (last 3 results) No results for input(s): PROBNP in the last 8760 hours. HbA1C: No results for input(s): HGBA1C in the last 72 hours. CBG: No results for input(s): GLUCAP in the last 168 hours. Lipid Profile: No results for input(s): CHOL, HDL, LDLCALC, TRIG, CHOLHDL, LDLDIRECT in the last 72 hours. Thyroid Function Tests:  Recent Labs  06/17/16 1113  TSH 1.770   Anemia Panel: No results for input(s): VITAMINB12, FOLATE, FERRITIN, TIBC, IRON, RETICCTPCT in the last 72 hours. Urine analysis:    Component Value Date/Time   COLORURINE YELLOW 11/17/2008 1611   APPEARANCEUR CLEAR 11/17/2008 1611   LABSPEC 1.020 11/17/2008 1611   PHURINE 5.5 11/17/2008 1611   GLUCOSEU NEGATIVE 11/17/2008 1611   HGBUR NEGATIVE 11/17/2008 1611   BILIRUBINUR NEGATIVE 11/17/2008 1611   KETONESUR NEGATIVE 11/17/2008 1611   PROTEINUR NEGATIVE 11/17/2008 1611   UROBILINOGEN 0.2 11/17/2008 1611   NITRITE NEGATIVE 11/17/2008 1611   LEUKOCYTESUR NEGATIVE 11/17/2008 1611     ) Recent Results (from the past 240 hour(s))  MRSA PCR Screening     Status: None   Collection Time: 06/17/16  1:37 PM  Result Value Ref Range Status   MRSA by PCR NEGATIVE NEGATIVE Final    Comment:        The GeneXpert MRSA Assay (FDA approved for NASAL specimens only), is one component of a comprehensive MRSA colonization surveillance program. It is not intended to diagnose MRSA infection nor to guide or monitor treatment for MRSA infections.       Anti-infectives    Start     Dose/Rate Route  Frequency Ordered Stop   06/17/16 1015  levofloxacin (LEVAQUIN) IVPB 750 mg     750 mg 100 mL/hr over 90 Minutes Intravenous  Once 06/17/16 1016 06/17/16 1429       Radiology Studies: Ct Abdomen Pelvis Wo Contrast  Result Date: 06/17/2016 CLINICAL DATA:  Shortness of breath for 1 week. High blood pressure. EXAM: CT CHEST, ABDOMEN AND PELVIS WITHOUT CONTRAST TECHNIQUE: Multidetector CT imaging of the chest, abdomen and pelvis was performed following the standard protocol without IV contrast. COMPARISON:  October 30, 2008 CT scan FINDINGS: CT  CHEST FINDINGS Cardiovascular: The patient's previously identified thoracic aortic aneurysm and dissection on the 2010 study has been repaired. The repaired ascending thoracic aorta measures up to 4.1 cm on series 3, image 34. The study cannot evaluate for dissection given the lack contrast. The central pulmonary arteries are normal. The heart remains enlarged. Small bilateral pleural effusions. No pericardial effusion. Mediastinum/Nodes: There is a nodule in the left thyroid lobe measuring up to 2.1 cm, not of 20 seen on the August 2010 chest CT. The esophagus is normal. No adenopathy seen in the chest. Lungs/Pleura: The trachea and bronchi are well aerated. Bronchial wall thickening is identified, particularly in the right upper lobe and right lower lobe. Bilateral pulmonary opacities are identified, right greater than left. The bilateral pulmonary opacities are largely ground-glass with a crazy paving pattern on the right. Evaluation for pulmonary nodules and masses is limited due to the bilateral pulmonary opacities but no suspicious masses are identified. Small bilateral pleural effusions, right greater than left, are identified. Musculoskeletal: Sternotomy wires are grossly intact. No other acute bony abnormalities are identified. CT ABDOMEN PELVIS FINDINGS Hepatobiliary: No focal liver abnormality is seen. No gallstones, gallbladder wall thickening, or biliary  dilatation. Pancreas: Unremarkable. No pancreatic ductal dilatation or surrounding inflammatory changes. Spleen: Normal in size without focal abnormality. Adrenals/Urinary Tract: No renal stones, masses, hydronephrosis, or perinephric stranding. A vascular calcification is seen in the left renal hilum. Note ureterectasis or ureteral stones. The bladder is unremarkable. Stomach/Bowel: The stomach and small bowel are normal. The colon and appendix are normal. Vascular/Lymphatic: The common iliac arteries measure 1.9 cm bilaterally. The abdominal aorta demonstrates scattered atherosclerotic change but no aneurysmal dilatation. No adenopathy. Reproductive: Prostate is unremarkable. Other: There is a small amount of free fluid in the pelvis. No free air identified. There is a fat containing umbilical hernia. Musculoskeletal: No acute bony abnormalities identified. Mild trabecular thickening seen in the pelvic bones. IMPRESSION: 1. Bilateral pulmonary opacities are primarily ground-glass in nature with a crazy paving pattern on the right. The findings are nonspecific but in the setting of bilateral pleural effusions and cardiomegaly, pulmonary edema should be highly considered. Infectious or inflammatory processes are not completely excluded. 2. Repair of the patient's previous thoracic aortic aneurysm and dissection. The repaired aorta measures 4.1 cm in AP diameter. 3. 2.1 cm nodule in the left thyroid lobe. Recommend ultrasound for better evaluation. 4. There is a tiny amount of free fluid in the pelvis which is nonspecific. Electronically Signed   By: Dorise Bullion III M.D   On: 06/17/2016 10:05   Dg Chest 2 View  Result Date: 06/17/2016 CLINICAL DATA:  Short of breath this morning. EXAM: CHEST  2 VIEW COMPARISON:  01/12/2009 FINDINGS: There is bilateral patchy interstitial and alveolar airspace opacities predominantly at the lung bases. Trace bilateral pleural effusions. No pneumothorax. Stable cardiomegaly.  Prior CABG. No acute osseous abnormality. IMPRESSION: Cardiomegaly with bilateral patchy interstitial and alveolar airspace opacities and trace pleural effusions. Differential considerations include pulmonary edema versus multilobar pneumonia. Electronically Signed   By: Kathreen Devoid   On: 06/17/2016 08:45   Ct Chest Wo Contrast  Result Date: 06/17/2016 CLINICAL DATA:  Shortness of breath for 1 week. High blood pressure. EXAM: CT CHEST, ABDOMEN AND PELVIS WITHOUT CONTRAST TECHNIQUE: Multidetector CT imaging of the chest, abdomen and pelvis was performed following the standard protocol without IV contrast. COMPARISON:  October 30, 2008 CT scan FINDINGS: CT CHEST FINDINGS Cardiovascular: The patient's previously identified thoracic aortic aneurysm and dissection  on the 2010 study has been repaired. The repaired ascending thoracic aorta measures up to 4.1 cm on series 3, image 34. The study cannot evaluate for dissection given the lack contrast. The central pulmonary arteries are normal. The heart remains enlarged. Small bilateral pleural effusions. No pericardial effusion. Mediastinum/Nodes: There is a nodule in the left thyroid lobe measuring up to 2.1 cm, not of 20 seen on the August 2010 chest CT. The esophagus is normal. No adenopathy seen in the chest. Lungs/Pleura: The trachea and bronchi are well aerated. Bronchial wall thickening is identified, particularly in the right upper lobe and right lower lobe. Bilateral pulmonary opacities are identified, right greater than left. The bilateral pulmonary opacities are largely ground-glass with a crazy paving pattern on the right. Evaluation for pulmonary nodules and masses is limited due to the bilateral pulmonary opacities but no suspicious masses are identified. Small bilateral pleural effusions, right greater than left, are identified. Musculoskeletal: Sternotomy wires are grossly intact. No other acute bony abnormalities are identified. CT ABDOMEN PELVIS  FINDINGS Hepatobiliary: No focal liver abnormality is seen. No gallstones, gallbladder wall thickening, or biliary dilatation. Pancreas: Unremarkable. No pancreatic ductal dilatation or surrounding inflammatory changes. Spleen: Normal in size without focal abnormality. Adrenals/Urinary Tract: No renal stones, masses, hydronephrosis, or perinephric stranding. A vascular calcification is seen in the left renal hilum. Note ureterectasis or ureteral stones. The bladder is unremarkable. Stomach/Bowel: The stomach and small bowel are normal. The colon and appendix are normal. Vascular/Lymphatic: The common iliac arteries measure 1.9 cm bilaterally. The abdominal aorta demonstrates scattered atherosclerotic change but no aneurysmal dilatation. No adenopathy. Reproductive: Prostate is unremarkable. Other: There is a small amount of free fluid in the pelvis. No free air identified. There is a fat containing umbilical hernia. Musculoskeletal: No acute bony abnormalities identified. Mild trabecular thickening seen in the pelvic bones. IMPRESSION: 1. Bilateral pulmonary opacities are primarily ground-glass in nature with a crazy paving pattern on the right. The findings are nonspecific but in the setting of bilateral pleural effusions and cardiomegaly, pulmonary edema should be highly considered. Infectious or inflammatory processes are not completely excluded. 2. Repair of the patient's previous thoracic aortic aneurysm and dissection. The repaired aorta measures 4.1 cm in AP diameter. 3. 2.1 cm nodule in the left thyroid lobe. Recommend ultrasound for better evaluation. 4. There is a tiny amount of free fluid in the pelvis which is nonspecific. Electronically Signed   By: Dorise Bullion III M.D   On: 06/17/2016 10:05        Scheduled Meds: . amLODipine  10 mg Oral Daily  . aspirin  325 mg Oral Daily  . atenolol  50 mg Oral BID  . folic acid  1 mg Oral Daily  . heparin  5,000 Units Subcutaneous Q8H  . hydrALAZINE   25 mg Oral Q8H  . multivitamin with minerals  1 tablet Oral Daily  . pneumococcal 23 valent vaccine  0.5 mL Intramuscular Tomorrow-1000  . sodium chloride flush  3 mL Intravenous Q12H  . thiamine  100 mg Oral Daily   Or  . thiamine  100 mg Intravenous Daily   Continuous Infusions: . nitroGLYCERIN 10 mcg/min (06/18/16 0819)     LOS: 1 day    Time spent: 35 min    [place], DO [facility] Pager [phone]  If 7PM-7AM, please contact night-coverage [email or web address] Password [email or web address] 06/18/2016, 9:16 AM

## 2016-06-19 ENCOUNTER — Inpatient Hospital Stay (HOSPITAL_COMMUNITY): Payer: Managed Care, Other (non HMO)

## 2016-06-19 LAB — COMPREHENSIVE METABOLIC PANEL
ALT: 115 U/L — ABNORMAL HIGH (ref 17–63)
ANION GAP: 7 (ref 5–15)
AST: 40 U/L (ref 15–41)
Albumin: 2.3 g/dL — ABNORMAL LOW (ref 3.5–5.0)
Alkaline Phosphatase: 138 U/L — ABNORMAL HIGH (ref 38–126)
BILIRUBIN TOTAL: 0.3 mg/dL (ref 0.3–1.2)
BUN: 43 mg/dL — AB (ref 6–20)
CHLORIDE: 107 mmol/L (ref 101–111)
CO2: 24 mmol/L (ref 22–32)
Calcium: 10.2 mg/dL (ref 8.9–10.3)
Creatinine, Ser: 4 mg/dL — ABNORMAL HIGH (ref 0.61–1.24)
GFR, EST AFRICAN AMERICAN: 18 mL/min — AB (ref >60)
GFR, EST NON AFRICAN AMERICAN: 16 mL/min — AB (ref >60)
Glucose, Bld: 111 mg/dL — ABNORMAL HIGH (ref 65–99)
POTASSIUM: 3.8 mmol/L (ref 3.5–5.1)
Sodium: 138 mmol/L (ref 135–145)
Total Protein: 5.9 g/dL — ABNORMAL LOW (ref 6.5–8.1)

## 2016-06-19 LAB — CBC
HEMATOCRIT: 37 % — AB (ref 39.0–52.0)
HEMOGLOBIN: 12.3 g/dL — AB (ref 13.0–17.0)
MCH: 28.2 pg (ref 26.0–34.0)
MCHC: 33.2 g/dL (ref 30.0–36.0)
MCV: 84.9 fL (ref 78.0–100.0)
Platelets: 141 10*3/uL — ABNORMAL LOW (ref 150–400)
RBC: 4.36 MIL/uL (ref 4.22–5.81)
RDW: 14 % (ref 11.5–15.5)
WBC: 6.9 10*3/uL (ref 4.0–10.5)

## 2016-06-19 NOTE — Progress Notes (Signed)
Patient returned from radiology dept. Monitor placed back on patient. Patient stable.

## 2016-06-19 NOTE — Progress Notes (Signed)
Report called to 6E, patient to transfer to [phone].

## 2016-06-19 NOTE — Progress Notes (Signed)
   Per patient request, Advanced Directive (AD) documentation left at bedside.  If/when patient completes AD, please page the on-call chaplain or contact the spiritual care department (Mon. - Fri. 9AM - 3PM).  Will follow, as needed.  - Rev. Verdi  MDiv ThM

## 2016-06-19 NOTE — Progress Notes (Signed)
PROGRESS NOTE    Colin [name]  ZOX:[id number] DOB: 04-Mar-1966 DOA: 06/17/2016 PCP: Henrine Screws, MD     Brief Narrative:  Colin [name] is a very pleasant 51 y.o. male with medical history significant hypertension, descending thoracic aortic aneurysm status post surgery, hypertension, EtOH use who presents to the Emergency Department chief complaint worsening shortness of breath. Initial evaluation in the emergency department reveals hypertensive emergency, acute kidney injury, hypokalemia, elevated transaminase, elevated proBNP. He states that in the past he has been diagnosed with high blood pressure and he was prescribed amlodipine, losartan, hydrochlorothiazide and atenolol. He has been only taking the atenolol and he's been taking that intermittently. He was admitted for hypertensive emergency and treated with nicardipine gtt.   Assessment & Plan:   Principal Problem:   Hypertensive emergency Active Problems:   Hyperparathyroidism, primary ([place])   Acute kidney injury ([place])   Descending aortic aneurysm ([place])   Hypokalemia   Elevated transaminase level   Elevated troponin   Dyspnea   Hypertension emergency -Due to medical noncompliance. Patient states in the past he has been on 4 antihypertensive medications but has not taken any in the last 3 or 4 months and then he was only taking atenolol intermittently. -Improved with nicardipine gtt, now turned off -Resume home meds atenolol and amlodipine as well as hydralazine. Holding HCTZ and losartan for now due to AKI/CKD  -BP improved this morning   Acute kidney injury, unclear CKD stage -Has seen [facility] in the last 3 years -Holding HCTZ and losartan  -Stable at 4 currently, monitor BMP   Acute on chronic diastolic heart failure -Echo with EF 50-55%, inferobasal hypokinesis, no regional wall motion abnormalities   -CXR repeated this morning with improvement in pulmonary edema    Elevated transaminase  -Related to alcohol abuse? ALT > AST. Check hepatitis.  -CT abdomen with no focal liver abnormality  -CIWA   Elevated troponin -Flat at 0.11, without chest pain. Due to demand ischemia.   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 Denies SOB or CP this morning. He is concerned regarding traveling to [place] for [employer]. No other complaints besides mild headache. No nausea, vomiting, abdominal pain. Urinating well.   Objective: Vitals:   06/19/16 0324 06/19/16 0330 06/19/16 0500 06/19/16 0901  BP: (!) 163/111 (!) 150/104  (!) 170/100  Pulse: 69 68  72  Resp: 12 16  20   Temp: 98.3 F (36.8 C)   97.8 F (36.6 C)  TempSrc: Oral   Oral  SpO2: 97% 96%  (!) 89%  Weight:   87.4 kg (192 lb 10.9 oz)   Height:        Intake/Output Summary (Last 24 hours) at 06/19/16 1043 Last data filed at 06/19/16 0800  Gross per 24 hour  Intake           538.18 ml  Output             3350 ml  Net         -2811.82 ml   Filed Weights   06/17/16 0734 06/18/16  0500 06/19/16 0500  Weight: 81.6 kg (180 lb) 87.3 kg (192 lb 7.4 oz) 87.4 kg (192 lb 10.9 oz)    Examination:  General exam: Appears calm and comfortable  Respiratory system: Clear to auscultation. Respiratory effort normal. Cardiovascular system: S1 & S2 heard, RRR. No JVD, murmurs, rubs, gallops or clicks. No pedal edema. Gastrointestinal system: Abdomen is nondistended, soft and nontender. No organomegaly or masses felt. Normal bowel sounds  heard. Central nervous system: Alert and oriented. No focal neurological deficits. Extremities: Symmetric 5 x 5 power. Skin: No rashes, lesions or ulcers Psychiatry: Judgement and insight appear normal. Mood & affect appropriate.   Data Reviewed: I have personally reviewed following labs and imaging studies  CBC:  Recent Labs Lab 06/17/16 0748 06/18/16 0232 06/19/16 0254  WBC 5.0 5.7 6.9  HGB 12.9* 12.4* 12.3*  HCT 38.7* 36.7* 37.0*  MCV 84.7 85.0 84.9  PLT 133* 131* 409*   Basic Metabolic Panel:  Recent Labs Lab 06/17/16 0748 06/17/16 1113 06/17/16 1916 06/18/16 0232 06/19/16 0254  NA 135  --  136 134*  135 138  K 3.2*  --  3.7 3.2*  3.2* 3.8  CL 104  --  108 104  105 107  CO2 23  --  16* 21*  22 24  GLUCOSE 117*  --  124* 109*  111* 111*  BUN 49*  --  49* 44*  45* 43*  CREATININE 4.60* 4.19* 4.17* 4.22*  4.22* 4.00*  CALCIUM 9.8  --  9.5 9.8  9.9 10.2  MG  --   --   --  2.1  --   PHOS  --   --   --  2.7  --    GFR: Estimated Creatinine Clearance: 22.3 mL/min (A) (by C-G formula based on SCr of 4 mg/dL (H)). Liver Function Tests:  Recent Labs Lab 06/17/16 0748 06/18/16 0232 06/19/16 0254  AST 213* 88* 40  ALT 243* 168* 115*  ALKPHOS 173* 146* 138*  BILITOT 0.5 0.5 0.3  PROT 6.0* 5.8* 5.9*  ALBUMIN 2.6* 2.3* 2.3*   No results for input(s): LIPASE, AMYLASE in the last 168 hours. No results for input(s): AMMONIA in the last 168 hours. Coagulation Profile: No results for input(s): INR, PROTIME in the last 168 hours. Cardiac Enzymes:  Recent Labs Lab 06/17/16 1419 06/17/16 1839 06/18/16 0232  TROPONINI 0.11* 0.11* 0.11*  0.11*   BNP (last 3 results) No results for input(s): PROBNP in the last 8760 hours. HbA1C: No results for input(s): HGBA1C in the last 72 hours. CBG: No results for input(s): GLUCAP in the last 168 hours. Lipid Profile: No results for input(s): CHOL, HDL, LDLCALC, TRIG, CHOLHDL, LDLDIRECT in the last 72  hours. Thyroid Function Tests:  Recent Labs  06/17/16 1113  TSH 1.770   Anemia Panel: No results for input(s): VITAMINB12, FOLATE, FERRITIN, TIBC, IRON, RETICCTPCT in the last 72 hours. Sepsis Labs:  Recent Labs Lab 06/17/16 1041  LATICACIDVEN 1.22    Recent Results (from the past 240 hour(s))  MRSA PCR Screening     Status: None   Collection Time: 06/17/16  1:37 PM  Result Value Ref Range Status   MRSA by PCR NEGATIVE NEGATIVE Final    Comment:        The GeneXpert MRSA Assay (FDA approved for NASAL specimens only), is one component of a comprehensive MRSA colonization surveillance program. It is not intended to diagnose MRSA infection nor to guide or monitor treatment for MRSA infections.  Radiology Studies: Dg Chest 2 View  Result Date: 06/19/2016 CLINICAL DATA:  Pulmonary edema EXAM: CHEST  2 VIEW COMPARISON:  06/17/2016 FINDINGS: Cardiomediastinal silhouette is stable. Status post median sternotomy. Persistent mild interstitial prominence bilateral perihilar and bilateral lower lobes suspicious for mild interstitial edema or pneumonitis. No segmental consolidation. No pneumothorax. IMPRESSION: Persistent mild interstitial prominence bilateral perihilar and bilateral lower lobes suspicious for mild interstitial edema or pneumonitis. No segmental consolidation. No pneumothorax. Electronically Signed   By: Lahoma Crocker M.D.   On: 06/19/2016 09:23      Scheduled Meds: . amLODipine  10 mg Oral Daily  . aspirin  325 mg Oral Daily  . atenolol  50 mg Oral BID  . folic acid  1 mg Oral Daily  . heparin  5,000 Units Subcutaneous Q8H  . hydrALAZINE  50 mg Oral Q8H  . multivitamin with minerals  1 tablet Oral Daily  . sodium chloride flush  3 mL Intravenous Q12H  . thiamine  100 mg Oral Daily   Or  . thiamine  100 mg Intravenous Daily   Continuous Infusions: . nitroGLYCERIN Stopped (06/19/16 0655)     LOS: 2 days    Time spent: 40 minutes   Dessa Phi, DO [facility] [email or web address] Password [facility] 06/19/2016, 10:43 AM

## 2016-06-19 NOTE — Progress Notes (Signed)
06/19/2016 2:45 PM  Report received from 4E. Room is ready for patient.   Armanda Forand [employer], RN-BC, [employer] Phone [phone]

## 2016-06-19 NOTE — Progress Notes (Signed)
Patient off monitor to radiology for cxr.

## 2016-06-19 NOTE — Progress Notes (Signed)
06/19/2016 3:04 PM  Transfer Note:   Traveling Method: Via wheelchair with RN from 4E Transferring Unit: 4E11 Mental Orientation: Alert and oriented X4 Telemetry: Per MD orders; box 6E12 Assessment: Completed Skin: Warm, dry and intact. IV: Clean, dry and intact. Pain: Stated no pain unless coughing. Then feels tightness in back; not new pain Tubes: N/A Safety Measures: Safety Fall Prevention Plan has been given, discussed and signed. Admission: Completed [address] [place] Orientation: Patient has been orientated to the room, unit and staff.  Family: None at the bedside at this time  Transferring Incident: Patient labs are looking better and does not need higher level of care. Refer to MD note.   Orders have been reviewed and implemented. Will continue to monitor the patient. Call light has been placed within reach.   Elic Vencill BSN, RN-BC, RN3 Phone number: [phone]

## 2016-06-19 NOTE — Progress Notes (Signed)
Patient transferred to [phone].

## 2016-06-19 NOTE — Care Management Note (Signed)
Case Management Note  Patient Details  Name: LOWEN BARRINGER MRN: [id number] Date of Birth: 11-Mar-1966  Subjective/Objective:    From home with children ages 40 and 73, presents with hypertensive urgency.  PTA indep.  Will dc nitro drip this am. Has pcp Dr. Inda Merlin, has medication coverage and transportation at dc.  NCM will cont to follow for dc needs.                Action/Plan:   Expected Discharge Date:                  Expected Discharge Plan:  Home/Self Care  In-House Referral:     Discharge planning Services  CM Consult  Post Acute Care Choice:    Choice offered to:     DME Arranged:    DME Agency:     HH Arranged:    HH Agency:     Status of Service:  Completed, signed off  If discussed at [employer] of Stay Meetings, dates discussed:    Additional Comments:  Zenon Mayo, RN 06/19/2016, 11:33 AM

## 2016-06-20 ENCOUNTER — Encounter: Payer: Self-pay | Admitting: Internal Medicine

## 2016-06-20 LAB — HEPATITIS PANEL, ACUTE
HCV Ab: <0.1 s/co ratio (ref 0.0–0.9)
HEP A IGM: NEGATIVE
HEP B C IGM: NEGATIVE
HEP B S AG: NEGATIVE

## 2016-06-20 LAB — BASIC METABOLIC PANEL
Anion gap: 8 (ref 5–15)
BUN: 37 mg/dL — AB (ref 6–20)
CO2: 23 mmol/L (ref 22–32)
CREATININE: 3.96 mg/dL — AB (ref 0.61–1.24)
Calcium: 10.4 mg/dL — ABNORMAL HIGH (ref 8.9–10.3)
Chloride: 105 mmol/L (ref 101–111)
GFR calc Af Amer: 19 mL/min — ABNORMAL LOW (ref >60)
GFR calc non Af Amer: 16 mL/min — ABNORMAL LOW (ref >60)
GLUCOSE: 110 mg/dL — AB (ref 65–99)
Potassium: 3.5 mmol/L (ref 3.5–5.1)
Sodium: 136 mmol/L (ref 135–145)

## 2016-06-20 MED ORDER — HYDRALAZINE HCL 50 MG PO TABS: 50.0000 mg | ORAL_TABLET | Freq: Three times a day (TID) | ORAL | 0 refills | Status: DC

## 2016-06-20 MED ORDER — CARVEDILOL 6.25 MG PO TABS
6.2500 mg | ORAL_TABLET | Freq: Two times a day (BID) | ORAL | Status: DC
  Administered 2016-06-20: 6.25 mg via ORAL
  Filled 2016-06-20: qty 1

## 2016-06-20 MED ORDER — AMLODIPINE BESYLATE 10 MG PO TABS: 10.0000 mg | ORAL_TABLET | Freq: Every day | ORAL | 0 refills | Status: DC

## 2016-06-20 MED ORDER — CARVEDILOL 6.25 MG PO TABS: 6.2500 mg | ORAL_TABLET | Freq: Two times a day (BID) | ORAL | 0 refills | Status: DC

## 2016-06-20 MED ORDER — BLOOD PRESSURE CUFF MISC: 0 refills | Status: AC

## 2016-06-20 NOTE — Progress Notes (Signed)
Colin [name] to be D/C'd Home per MD order.  Discussed prescriptions and follow up appointments with the patient. Prescriptions given to patient, medication list explained in detail. Pt verbalized understanding.  Allergies as of 06/20/2016   No Known Allergies     Medication List    STOP taking these medications   atenolol 50 MG tablet Commonly known as:  TENORMIN     TAKE these medications   amLODipine 10 MG tablet Commonly known as:  NORVASC Take 1 tablet (10 mg total) by mouth daily.   Blood Pressure Cuff Misc Use blood pressure cuff to measure your blood pressure at the same time each day. Record each blood pressure and bring to your primary care physician.   carvedilol 6.25 MG tablet Commonly known as:  COREG Take 1 tablet (6.25 mg total) by mouth 2 (two) times daily with a meal.   hydrALAZINE 50 MG tablet Commonly known as:  APRESOLINE Take 1 tablet (50 mg total) by mouth every 8 (eight) hours.       Vitals:   06/20/16 0523 06/20/16 0830  BP: (!) 172/103 (!) 146/90  Pulse: 74 72  Resp: 16 16  Temp: 99.2 F (37.3 C) 98.4 F (36.9 C)    Skin clean, dry and intact without evidence of skin break down, no evidence of skin tears noted. IV catheter discontinued intact. Site without signs and symptoms of complications. Dressing and pressure applied. Pt denies pain at this time. No complaints noted.  An After Visit Summary was printed and given to the patient. Patient escorted via [place], and D/C home via private auto.  Dixie Dials RN, BSN

## 2016-06-20 NOTE — Discharge Summary (Signed)
Physician Discharge Summary  Colin [name] QVZ:[id number] DOB: [birth year] DOA: 06/17/2016  PCP: Henrine Screws, MD  Admit date: 06/17/2016 Discharge date: 06/20/2016  Admitted From: Home Disposition:  Home  Recommendations for Outpatient Follow-up:  1. Follow up with PCP in 1 week regarding your blood pressure control, thyroid nodule  2. Follow up with Nephrology in 1 week regarding chronic kidney disease  3. 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 Initial evaluation in the emergency department reveals hypertensive emergency, acute kidney injury, hypokalemia, elevated transaminase, elevated proBNP. He states that in the past he has been diagnosed with high blood pressure and he was prescribed amlodipine, losartan, hydrochlorothiazide and atenolol. He has been only taking the atenolol and he's been taking that intermittently. He was admitted for hypertensive emergency and treated with nitro gtt and was transitioned to oral regimen. Kidney function remained impaired but Cr stable over the past few days in the 4s. He used to follow up with Nephrology which he should do so as outpatient.   Subjective on day of discharge: BP much better controlled, no complaints of CP, SOB. Minimal headaches overnight but improved currently. Discussed importance of checking BP at home, taking medications, follow up with PCP and Nephrology for repeat labs.   Discharge Diagnoses:  Principal Problem:   Hypertensive emergency Active Problems:   Hyperparathyroidism, primary ([place])   Acute kidney  injury ([place])   Descending aortic aneurysm ([place])   Hypokalemia   Elevated transaminase level   Elevated troponin   Dyspnea  Hypertension emergency -Due to medical noncompliance. Patient states in the past he has been on 4 antihypertensive medications but has not taken any in the last 3 or 4 months and then he was only taking atenolol intermittently. -Improved with nitro gtt, now turned off -Restart coreg, hydralazine, amlodipine. Holding HCTZ and losartan for now due to AKI/CKD  -BP improved this morning 146/90   Acute kidney injury, unclear CKD stage -Has seen [facility] in the last 3 years -Holding HCTZ and losartan  -Stable currently, monitor BMP as outpatient and follow up with your Nephrologist   Acute on chronic diastolic heart failure -Echo with EF 50-55%, inferobasal hypokinesis, no regional wall motion abnormalities   -CXR repeated with improvement in pulmonary edema   Elevated transaminase  -Related to alcohol abuse? ALT > AST. Hepatitis panel negative -CT abdomen with no focal liver abnormality  -Follow up LFT as outpatient   Elevated troponin -Flat at 0.11, without chest pain. Due to demand ischemia.   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 -Calcium level within normal limits  -?Previous surgical exploration  -Follow up with PCP   Thoracic aortic aneurysm, s/p repair -4.1cm AP diameter    Discharge Instructions  Discharge Instructions    Call MD for:    Complete by:  As directed    Uncontrolled blood pressure systolic pressure > 329, severe chest pain, shortness of breath, headaches   Call MD for:  difficulty breathing, headache or visual disturbances    Complete by:  As directed    Call MD for:  persistant dizziness or light-headedness    Complete by:  As directed    Call MD for:  severe uncontrolled pain    Complete by:  As directed    Diet - low sodium heart healthy    Complete  by:  As directed    Increase activity slowly    Complete by:  As directed      Allergies as of 06/20/2016   No Known Allergies     Medication List    STOP taking these medications   atenolol 50 MG tablet Commonly known as:  TENORMIN     TAKE these medications   amLODipine 10 MG tablet Commonly known as:  NORVASC Take 1 tablet (10 mg total) by mouth daily.   Blood Pressure Cuff Misc Use blood pressure cuff to measure your blood pressure at the same time each day. Record each blood pressure and bring to your primary care physician.   carvedilol 6.25 MG tablet Commonly known as:  COREG Take 1 tablet (6.25 mg total) by mouth 2 (two) times daily with a meal.   hydrALAZINE 50 MG tablet Commonly known as:  APRESOLINE Take 1 tablet (50 mg total) by mouth every 8 (eight) hours.      Follow-up Information    GATES,ROBERT NEVILL, MD. Schedule an appointment as soon as possible for a visit in 1 week(s).   Specialty:  Internal Medicine Contact information: [address] [place] [address] [phone]        [facility]. Schedule an appointment as soon as possible for a visit in 1 week(s).   Specialty:  Nephrology Contact information: [address] [place] [address] [phone]          No Known Allergies  Consultations:  None   Procedures/Studies: Ct Abdomen Pelvis Wo Contrast  Result Date: 06/17/2016 CLINICAL DATA:  Shortness of breath for 1 week. High blood pressure. EXAM: CT CHEST, ABDOMEN AND PELVIS WITHOUT CONTRAST TECHNIQUE: Multidetector CT imaging of the chest, abdomen and pelvis was performed following the standard protocol without IV contrast. COMPARISON:  October 30, 2008 CT scan FINDINGS: CT CHEST FINDINGS Cardiovascular: The patient's previously identified thoracic aortic aneurysm and dissection on the 2010 study has been repaired. The repaired ascending thoracic aorta measures up to 4.1  cm on series 3, image 34. The study cannot evaluate for dissection given the lack contrast. The central pulmonary arteries are normal. The heart remains enlarged. Small bilateral pleural effusions. No pericardial effusion. Mediastinum/Nodes: There is a nodule in the left thyroid lobe measuring up to 2.1 cm, not of 20 seen on the August 2010 chest CT. The esophagus is normal. No adenopathy seen in the chest. Lungs/Pleura: The trachea and bronchi are well aerated. Bronchial wall thickening is identified, particularly in the right upper lobe and right lower lobe. Bilateral pulmonary opacities are identified, right greater than left. The bilateral pulmonary opacities are largely ground-glass with a crazy paving pattern on the right. Evaluation for pulmonary nodules and masses is limited due to the bilateral pulmonary opacities but no suspicious masses are identified. Small bilateral pleural effusions, right greater than left, are identified. Musculoskeletal: Sternotomy wires are grossly intact. No other acute bony abnormalities are identified. CT ABDOMEN PELVIS FINDINGS Hepatobiliary: No focal liver abnormality is seen. No gallstones, gallbladder wall thickening, or biliary dilatation. Pancreas: Unremarkable. No pancreatic ductal dilatation or surrounding inflammatory changes. Spleen: Normal in size without focal abnormality. Adrenals/Urinary Tract: No renal stones, masses, hydronephrosis, or perinephric stranding. A vascular calcification is seen in the left  renal hilum. Note ureterectasis or ureteral stones. The bladder is unremarkable. Stomach/Bowel: The stomach and small bowel are normal. The colon and appendix are normal. Vascular/Lymphatic: The common iliac arteries measure 1.9 cm bilaterally. The abdominal aorta demonstrates scattered atherosclerotic change but no aneurysmal dilatation. No adenopathy. Reproductive: Prostate is unremarkable. Other: There is a small amount of free fluid in the pelvis. No free air  identified. There is a fat containing umbilical hernia. Musculoskeletal: No acute bony abnormalities identified. Mild trabecular thickening seen in the pelvic bones. IMPRESSION: 1. Bilateral pulmonary opacities are primarily ground-glass in nature with a crazy paving pattern on the right. The findings are nonspecific but in the setting of bilateral pleural effusions and cardiomegaly, pulmonary edema should be highly considered. Infectious or inflammatory processes are not completely excluded. 2. Repair of the patient's previous thoracic aortic aneurysm and dissection. The repaired aorta measures 4.1 cm in AP diameter. 3. 2.1 cm nodule in the left thyroid lobe. Recommend ultrasound for better evaluation. 4. There is a tiny amount of free fluid in the pelvis which is nonspecific. Electronically Signed   By: Dorise Bullion III M.D   On: 06/17/2016 10:05   Dg Chest 2 View  Result Date: 06/19/2016 CLINICAL DATA:  Pulmonary edema EXAM: CHEST  2 VIEW COMPARISON:  06/17/2016 FINDINGS: Cardiomediastinal silhouette is stable. Status post median sternotomy. Persistent mild interstitial prominence bilateral perihilar and bilateral lower lobes suspicious for mild interstitial edema or pneumonitis. No segmental consolidation. No pneumothorax. IMPRESSION: Persistent mild interstitial prominence bilateral perihilar and bilateral lower lobes suspicious for mild interstitial edema or pneumonitis. No segmental consolidation. No pneumothorax. Electronically Signed   By: Lahoma Crocker M.D.   On: 06/19/2016 09:23   Dg Chest 2 View  Result Date: 06/17/2016 CLINICAL DATA:  Short of breath this morning. EXAM: CHEST  2 VIEW COMPARISON:  01/12/2009 FINDINGS: There is bilateral patchy interstitial and alveolar airspace opacities predominantly at the lung bases. Trace bilateral pleural effusions. No pneumothorax. Stable cardiomegaly. Prior CABG. No acute osseous abnormality. IMPRESSION: Cardiomegaly with bilateral patchy interstitial and  alveolar airspace opacities and trace pleural effusions. Differential considerations include pulmonary edema versus multilobar pneumonia. Electronically Signed   By: Kathreen Devoid   On: 06/17/2016 08:45   Ct Chest Wo Contrast  Result Date: 06/17/2016 CLINICAL DATA:  Shortness of breath for 1 week. High blood pressure. EXAM: CT CHEST, ABDOMEN AND PELVIS WITHOUT CONTRAST TECHNIQUE: Multidetector CT imaging of the chest, abdomen and pelvis was performed following the standard protocol without IV contrast. COMPARISON:  October 30, 2008 CT scan FINDINGS: CT CHEST FINDINGS Cardiovascular: The patient's previously identified thoracic aortic aneurysm and dissection on the 2010 study has been repaired. The repaired ascending thoracic aorta measures up to 4.1 cm on series 3, image 34. The study cannot evaluate for dissection given the lack contrast. The central pulmonary arteries are normal. The heart remains enlarged. Small bilateral pleural effusions. No pericardial effusion. Mediastinum/Nodes: There is a nodule in the left thyroid lobe measuring up to 2.1 cm, not of 20 seen on the August 2010 chest CT. The esophagus is normal. No adenopathy seen in the chest. Lungs/Pleura: The trachea and bronchi are well aerated. Bronchial wall thickening is identified, particularly in the right upper lobe and right lower lobe. Bilateral pulmonary opacities are identified, right greater than left. The bilateral pulmonary opacities are largely ground-glass with a crazy paving pattern on the right. Evaluation for pulmonary nodules and masses is limited due to the bilateral pulmonary opacities but no suspicious masses  are identified. Small bilateral pleural effusions, right greater than left, are identified. Musculoskeletal: Sternotomy wires are grossly intact. No other acute bony abnormalities are identified. CT ABDOMEN PELVIS FINDINGS Hepatobiliary: No focal liver abnormality is seen. No gallstones, gallbladder wall thickening, or  biliary dilatation. Pancreas: Unremarkable. No pancreatic ductal dilatation or surrounding inflammatory changes. Spleen: Normal in size without focal abnormality. Adrenals/Urinary Tract: No renal stones, masses, hydronephrosis, or perinephric stranding. A vascular calcification is seen in the left renal hilum. Note ureterectasis or ureteral stones. The bladder is unremarkable. Stomach/Bowel: The stomach and small bowel are normal. The colon and appendix are normal. Vascular/Lymphatic: The common iliac arteries measure 1.9 cm bilaterally. The abdominal aorta demonstrates scattered atherosclerotic change but no aneurysmal dilatation. No adenopathy. Reproductive: Prostate is unremarkable. Other: There is a small amount of free fluid in the pelvis. No free air identified. There is a fat containing umbilical hernia. Musculoskeletal: No acute bony abnormalities identified. Mild trabecular thickening seen in the pelvic bones. IMPRESSION: 1. Bilateral pulmonary opacities are primarily ground-glass in nature with a crazy paving pattern on the right. The findings are nonspecific but in the setting of bilateral pleural effusions and cardiomegaly, pulmonary edema should be highly considered. Infectious or inflammatory processes are not completely excluded. 2. Repair of the patient's previous thoracic aortic aneurysm and dissection. The repaired aorta measures 4.1 cm in AP diameter. 3. 2.1 cm nodule in the left thyroid lobe. Recommend ultrasound for better evaluation. 4. There is a tiny amount of free fluid in the pelvis which is nonspecific. Electronically Signed   By: Dorise Bullion III M.D   On: 06/17/2016 10:05    Echo 06/17/2016 Study Conclusions  - Left ventricle: inferobasal hypokinesis The cavity size was   mildly dilated. Wall thickness was increased in a pattern of mild   LVH. Systolic function was normal. The estimated ejection   fraction was in the range of 50% to 55%. Wall motion was normal;   there were  no regional wall motion abnormalities. Doppler   parameters are consistent with both elevated ventricular   end-diastolic filling pressure and elevated left atrial filling   pressure. - Mitral valve: There was mild regurgitation. - Left atrium: The atrium was moderately dilated. - Right atrium: The atrium was mildly dilated. - Atrial septum: No defect or patent foramen ovale was identified.    Discharge Exam: Vitals:   06/20/16 0523 06/20/16 0830  BP: (!) 172/103 (!) 146/90  Pulse: 74 72  Resp: 16 16  Temp: 99.2 F (37.3 C) 98.4 F (36.9 C)   Vitals:   06/19/16 2135 06/19/16 2231 06/20/16 0523 06/20/16 0830  BP: (!) 164/100  (!) 172/103 (!) 146/90  Pulse: 65  74 72  Resp: 15  16 16   Temp: 97.8 F (36.6 C)  99.2 F (37.3 C) 98.4 F (36.9 C)  TempSrc:    Oral  SpO2: 97%  95% 92%  Weight:  83.9 kg (185 lb)    Height:        General: Pt is alert, awake, not in acute distress Cardiovascular: RRR, S1/S2 +, no rubs, no gallops Respiratory: CTA bilaterally, no wheezing, no rhonchi Abdominal: Soft, NT, ND, bowel sounds + Extremities: no edema, no cyanosis    The results of significant diagnostics from this hospitalization (including imaging, microbiology, ancillary and laboratory) are listed below for reference.     Microbiology: Recent Results (from the past 240 hour(s))  MRSA PCR Screening     Status: None   Collection Time: 06/17/16  1:37 PM  Result Value Ref Range Status   MRSA by PCR NEGATIVE NEGATIVE Final    Comment:        The GeneXpert MRSA Assay (FDA approved for NASAL specimens only), is one component of a comprehensive MRSA colonization surveillance program. It is not intended to diagnose MRSA infection nor to guide or monitor treatment for MRSA infections.      Labs: BNP (last 3 results)  Recent Labs  06/17/16 1031  BNP 2,694.8*   Basic Metabolic Panel:  Recent Labs Lab 06/17/16 0748 06/17/16 1113 06/17/16 1916 06/18/16 0232  06/19/16 0254 06/20/16 0712  NA 135  --  136 134*  135 138 136  K 3.2*  --  3.7 3.2*  3.2* 3.8 3.5  CL 104  --  108 104  105 107 105  CO2 23  --  16* 21*  22 24 23   GLUCOSE 117*  --  124* 109*  111* 111* 110*  BUN 49*  --  49* 44*  45* 43* 37*  CREATININE 4.60* 4.19* 4.17* 4.22*  4.22* 4.00* 3.96*  CALCIUM 9.8  --  9.5 9.8  9.9 10.2 10.4*  MG  --   --   --  2.1  --   --   PHOS  --   --   --  2.7  --   --    Liver Function Tests:  Recent Labs Lab 06/17/16 0748 06/18/16 0232 06/19/16 0254  AST 213* 88* 40  ALT 243* 168* 115*  ALKPHOS 173* 146* 138*  BILITOT 0.5 0.5 0.3  PROT 6.0* 5.8* 5.9*  ALBUMIN 2.6* 2.3* 2.3*   No results for input(s): LIPASE, AMYLASE in the last 168 hours. No results for input(s): AMMONIA in the last 168 hours. CBC:  Recent Labs Lab 06/17/16 0748 06/18/16 0232 06/19/16 0254  WBC 5.0 5.7 6.9  HGB 12.9* 12.4* 12.3*  HCT 38.7* 36.7* 37.0*  MCV 84.7 85.0 84.9  PLT 133* 131* 141*   Cardiac Enzymes:  Recent Labs Lab 06/17/16 1419 06/17/16 1839 06/18/16 0232  TROPONINI 0.11* 0.11* 0.11*  0.11*   BNP: Invalid input(s): POCBNP CBG: No results for input(s): GLUCAP in the last 168 hours. D-Dimer No results for input(s): DDIMER in the last 72 hours. Hgb A1c No results for input(s): HGBA1C in the last 72 hours. Lipid Profile No results for input(s): CHOL, HDL, LDLCALC, TRIG, CHOLHDL, LDLDIRECT in the last 72 hours. Thyroid function studies  Recent Labs  06/17/16 1113  TSH 1.770   Anemia work up No results for input(s): VITAMINB12, FOLATE, FERRITIN, TIBC, IRON, RETICCTPCT in the last 72 hours. Urinalysis    Component Value Date/Time   COLORURINE STRAW (A) 06/18/2016 1820   APPEARANCEUR CLEAR 06/18/2016 1820   LABSPEC 1.008 06/18/2016 1820   PHURINE 6.0 06/18/2016 1820   GLUCOSEU NEGATIVE 06/18/2016 1820   HGBUR NEGATIVE 06/18/2016 1820   BILIRUBINUR NEGATIVE 06/18/2016 1820   KETONESUR NEGATIVE 06/18/2016 1820    PROTEINUR 100 (A) 06/18/2016 1820   UROBILINOGEN 0.2 11/17/2008 1611   NITRITE NEGATIVE 06/18/2016 1820   LEUKOCYTESUR NEGATIVE 06/18/2016 1820   Sepsis Labs Invalid input(s): PROCALCITONIN,  WBC,  LACTICIDVEN Microbiology Recent Results (from the past 240 hour(s))  MRSA PCR Screening     Status: None   Collection Time: 06/17/16  1:37 PM  Result Value Ref Range Status   MRSA by PCR NEGATIVE NEGATIVE Final    Comment:        The GeneXpert MRSA Assay (FDA approved for  NASAL specimens only), is one component of a comprehensive MRSA colonization surveillance program. It is not intended to diagnose MRSA infection nor to guide or monitor treatment for MRSA infections.      Time coordinating discharge: 40 minutes  SIGNED:  Dessa Phi, DO [facility] Pager [phone]  If 7PM-7AM, please contact night-coverage [email or web address] Password [facility] 06/20/2016, 8:57 AM

## 2016-06-20 NOTE — Discharge Instructions (Signed)
Recommendations for Outpatient Follow-up:  1. Follow up with PCP in 1 week regarding your blood pressure control, thyroid nodule  2. Follow up with Nephrology in 1 week regarding chronic kidney disease  3. Please obtain BMP, LFT in 1 week    Hypertension Hypertension is another name for high blood pressure. High blood pressure forces your heart to work harder to pump blood. This can cause problems over time. There are two numbers in a blood pressure reading. There is a top number (systolic) over a bottom number (diastolic). It is best to have a blood pressure below 120/80. Healthy choices can help lower your blood pressure. You may need medicine to help lower your blood pressure if:  Your blood pressure cannot be lowered with healthy choices.  Your blood pressure is higher than 130/80. Follow these instructions at home: Eating and drinking   If directed, follow the DASH eating plan. This diet includes:  Filling half of your plate at each meal with fruits and vegetables.  Filling one quarter of your plate at each meal with whole grains. Whole grains include whole wheat pasta, brown rice, and whole grain bread.  Eating or drinking low-fat dairy products, such as skim milk or low-fat yogurt.  Filling one quarter of your plate at each meal with low-fat (lean) proteins. Low-fat proteins include fish, skinless chicken, eggs, beans, and tofu.  Avoiding fatty meat, cured and processed meat, or chicken with skin.  Avoiding premade or processed food.  Eat less than 1,500 mg of salt (sodium) a day.  Limit alcohol use to no more than 1 drink a day for nonpregnant women and 2 drinks a day for men. One drink equals 12 oz of beer, 5 oz of wine, or 1 oz of hard liquor. Lifestyle   Work with your doctor to stay at a healthy weight or to lose weight. Ask your doctor what the best weight is for you.  Get at least 30 minutes of exercise that causes your heart to beat faster (aerobic exercise) most  days of the week. This may include walking, swimming, or biking.  Get at least 30 minutes of exercise that strengthens your muscles (resistance exercise) at least 3 days a week. This may include lifting weights or pilates.  Do not use any products that contain nicotine or tobacco. This includes cigarettes and e-cigarettes. If you need help quitting, ask your doctor.  Check your blood pressure at home as told by your doctor.  Keep all follow-up visits as told by your doctor. This is important. Medicines   Take over-the-counter and prescription medicines only as told by your doctor. Follow directions carefully.  Do not skip doses of blood pressure medicine. The medicine does not work as well if you skip doses. Skipping doses also puts you at risk for problems.  Ask your doctor about side effects or reactions to medicines that you should watch for. Contact a doctor if:  You think you are having a reaction to the medicine you are taking.  You have headaches that keep coming back (recurring).  You feel dizzy.  You have swelling in your ankles.  You have trouble with your vision. Get help right away if:  You get a very bad headache.  You start to feel confused.  You feel weak or numb.  You feel faint.  You get very bad pain in your:  Chest.  Belly (abdomen).  You throw up (vomit) more than once.  You have trouble breathing. Summary  Hypertension  is another name for high blood pressure.  Making healthy choices can help lower blood pressure. If your blood pressure cannot be controlled with healthy choices, you may need to take medicine. This information is not intended to replace advice given to you by your health care provider. Make sure you discuss any questions you have with your health care provider. Document Released: 08/23/2007 Document Revised: 02/02/2016 Document Reviewed: 02/02/2016 Elsevier Interactive Patient Education  2017 [employer].

## 2016-09-27 ENCOUNTER — Inpatient Hospital Stay (HOSPITAL_COMMUNITY)
Admission: EM | Admit: 2016-09-27 | Discharge: 2016-09-30 | DRG: 305 | Disposition: A | Discharge status: Home / Self Care | Payer: Managed Care, Other (non HMO) | Attending: Internal Medicine | Admitting: Internal Medicine

## 2016-09-27 ENCOUNTER — Emergency Department (HOSPITAL_COMMUNITY): Payer: Managed Care, Other (non HMO)

## 2016-09-27 ENCOUNTER — Encounter (HOSPITAL_COMMUNITY): Payer: Self-pay | Admitting: Nurse Practitioner

## 2016-09-27 DIAGNOSIS — Z599 Problem related to housing and economic circumstances, unspecified: Secondary | ICD-10-CM

## 2016-09-27 DIAGNOSIS — D696 Thrombocytopenia, unspecified: Secondary | ICD-10-CM | POA: Diagnosis present

## 2016-09-27 DIAGNOSIS — I120 Hypertensive chronic kidney disease with stage 5 chronic kidney disease or end stage renal disease: Secondary | ICD-10-CM | POA: Diagnosis present

## 2016-09-27 DIAGNOSIS — E876 Hypokalemia: Secondary | ICD-10-CM | POA: Diagnosis present

## 2016-09-27 DIAGNOSIS — I248 Other forms of acute ischemic heart disease: Secondary | ICD-10-CM | POA: Diagnosis present

## 2016-09-27 DIAGNOSIS — Z79899 Other long term (current) drug therapy: Secondary | ICD-10-CM

## 2016-09-27 DIAGNOSIS — R748 Abnormal levels of other serum enzymes: Secondary | ICD-10-CM | POA: Diagnosis not present

## 2016-09-27 DIAGNOSIS — R079 Chest pain, unspecified: Secondary | ICD-10-CM | POA: Diagnosis not present

## 2016-09-27 DIAGNOSIS — I517 Cardiomegaly: Secondary | ICD-10-CM | POA: Diagnosis present

## 2016-09-27 DIAGNOSIS — I161 Hypertensive emergency: Principal | ICD-10-CM | POA: Diagnosis present

## 2016-09-27 DIAGNOSIS — N185 Chronic kidney disease, stage 5: Secondary | ICD-10-CM | POA: Diagnosis present

## 2016-09-27 DIAGNOSIS — N179 Acute kidney failure, unspecified: Secondary | ICD-10-CM | POA: Diagnosis present

## 2016-09-27 DIAGNOSIS — N2581 Secondary hyperparathyroidism of renal origin: Secondary | ICD-10-CM | POA: Diagnosis present

## 2016-09-27 DIAGNOSIS — D631 Anemia in chronic kidney disease: Secondary | ICD-10-CM | POA: Diagnosis present

## 2016-09-27 DIAGNOSIS — D649 Anemia, unspecified: Secondary | ICD-10-CM | POA: Diagnosis not present

## 2016-09-27 DIAGNOSIS — N184 Chronic kidney disease, stage 4 (severe): Secondary | ICD-10-CM

## 2016-09-27 DIAGNOSIS — D509 Iron deficiency anemia, unspecified: Secondary | ICD-10-CM | POA: Diagnosis present

## 2016-09-27 DIAGNOSIS — Z8249 Family history of ischemic heart disease and other diseases of the circulatory system: Secondary | ICD-10-CM

## 2016-09-27 DIAGNOSIS — Z9119 Patient's noncompliance with other medical treatment and regimen: Secondary | ICD-10-CM | POA: Diagnosis not present

## 2016-09-27 DIAGNOSIS — Z8679 Personal history of other diseases of the circulatory system: Secondary | ICD-10-CM | POA: Diagnosis not present

## 2016-09-27 DIAGNOSIS — Z9114 Patient's other noncompliance with medication regimen: Secondary | ICD-10-CM | POA: Diagnosis not present

## 2016-09-27 DIAGNOSIS — D638 Anemia in other chronic diseases classified elsewhere: Secondary | ICD-10-CM | POA: Diagnosis not present

## 2016-09-27 LAB — COMPREHENSIVE METABOLIC PANEL
ALBUMIN: 2.9 g/dL — AB (ref 3.5–5.0)
ALK PHOS: 117 U/L (ref 38–126)
ALT: 57 U/L (ref 17–63)
ANION GAP: 10 (ref 5–15)
AST: 40 U/L (ref 15–41)
BILIRUBIN TOTAL: 0.9 mg/dL (ref 0.3–1.2)
BUN: 46 mg/dL — ABNORMAL HIGH (ref 6–20)
CALCIUM: 9.9 mg/dL (ref 8.9–10.3)
CO2: 19 mmol/L — ABNORMAL LOW (ref 22–32)
Chloride: 108 mmol/L (ref 101–111)
Creatinine, Ser: 6.04 mg/dL — ABNORMAL HIGH (ref 0.61–1.24)
GFR calc Af Amer: 11 mL/min — ABNORMAL LOW (ref >60)
GFR, EST NON AFRICAN AMERICAN: 10 mL/min — AB (ref >60)
GLUCOSE: 93 mg/dL (ref 65–99)
POTASSIUM: 3.1 mmol/L — AB (ref 3.5–5.1)
Sodium: 137 mmol/L (ref 135–145)
TOTAL PROTEIN: 6.8 g/dL (ref 6.5–8.1)

## 2016-09-27 LAB — CBC WITH DIFFERENTIAL/PLATELET
BASOS PCT: 1 %
Basophils Absolute: 0 10*3/uL (ref 0.0–0.1)
Eosinophils Absolute: 0.2 10*3/uL (ref 0.0–0.7)
Eosinophils Relative: 3 %
HEMATOCRIT: 35.8 % — AB (ref 39.0–52.0)
HEMOGLOBIN: 12 g/dL — AB (ref 13.0–17.0)
LYMPHS ABS: 1.8 10*3/uL (ref 0.7–4.0)
Lymphocytes Relative: 31 %
MCH: 28.4 pg (ref 26.0–34.0)
MCHC: 33.5 g/dL (ref 30.0–36.0)
MCV: 84.8 fL (ref 78.0–100.0)
MONO ABS: 0.6 10*3/uL (ref 0.1–1.0)
MONOS PCT: 10 %
NEUTROS ABS: 3.1 10*3/uL (ref 1.7–7.7)
NEUTROS PCT: 55 %
Platelets: 103 10*3/uL — ABNORMAL LOW (ref 150–400)
RBC: 4.22 MIL/uL (ref 4.22–5.81)
RDW: 14.9 % (ref 11.5–15.5)
WBC: 5.7 10*3/uL (ref 4.0–10.5)

## 2016-09-27 MED ORDER — SODIUM CHLORIDE 0.9 % IV BOLUS (SEPSIS)
250.0000 mL | Freq: Once | INTRAVENOUS | Status: AC
  Administered 2016-09-27: 250 mL via INTRAVENOUS

## 2016-09-27 MED ORDER — LABETALOL HCL 5 MG/ML IV SOLN
20.0000 mg | Freq: Once | INTRAVENOUS | Status: AC
  Administered 2016-09-27: 20 mg via INTRAVENOUS
  Filled 2016-09-27: qty 4

## 2016-09-27 MED ORDER — NITROGLYCERIN IN D5W 200-5 MCG/ML-% IV SOLN
0.0000 ug/min | Freq: Once | INTRAVENOUS | Status: AC
  Administered 2016-09-27: 5 ug/min via INTRAVENOUS
  Filled 2016-09-27: qty 250

## 2016-09-27 MED ORDER — SODIUM CHLORIDE 0.9 % IV SOLN
Freq: Once | INTRAVENOUS | Status: AC
  Administered 2016-09-27: 21:00:00 via INTRAVENOUS

## 2016-09-27 MED ORDER — HYDRALAZINE HCL 20 MG/ML IJ SOLN
10.0000 mg | Freq: Four times a day (QID) | INTRAMUSCULAR | Status: DC | PRN
  Administered 2016-09-28 – 2016-09-30 (×3): 10 mg via INTRAVENOUS
  Filled 2016-09-27 (×3): qty 1

## 2016-09-27 MED ORDER — PRO-STAT SUGAR FREE PO LIQD
30.0000 mL | Freq: Two times a day (BID) | ORAL | Status: DC
  Administered 2016-09-28 – 2016-09-29 (×4): 30 mL via ORAL
  Administered 2016-09-30: 11:00:00 via ORAL
  Filled 2016-09-27 (×5): qty 30

## 2016-09-27 NOTE — ED Notes (Signed)
Pt given urinal and explained need for urine specimen. Pt verbalized understanding but reports he does not have to void at this time. Pt reports he will attempt to urinate.

## 2016-09-27 NOTE — ED Notes (Signed)
Patient transported to X-ray 

## 2016-09-27 NOTE — ED Provider Notes (Signed)
[facility] DEPT Provider Note   CSN: [id number] Arrival date & time: 09/27/16  1624     History   Chief Complaint Chief Complaint  Patient presents with  . Hypertension    HPI Colin [name] is a 51 y.o. male.  This a pleasant 51 year old male with a history of hypertension who has been off of his medication for approximately one month.  Due to financial reasons.  He was seen by his primary care physician today and given prescriptions that he can pick up tomorrow but was noted to be quite hypertensive and sent to the emergency department for IV and have hypertensive therapy. Patient does state that occasionally at night.  He will not sleep with his head elevated.  He does report some shortness of breath with exertion, and stair climbing. Denies any headache, chest pain, she will disturbance.      Past Medical History:  Diagnosis Date  . 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 Gilford Raid  . ETOH abuse   . 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 Hypertension   . Thyroid nodule     Patient Active Problem List   Diagnosis Date Noted  . Anemia 09/27/2016  . Thrombocytopenia ([place]) 09/27/2016  . ARF (acute renal failure) ([place]) 09/27/2016  . Hypertensive emergency 06/17/2016  . Acute kidney injury ([place]) 06/17/2016  . Hypokalemia 06/17/2016  . Elevated transaminase level 06/17/2016  . Elevated troponin 06/17/2016  . Dyspnea 06/17/2016  . Hypertension   . ETOH abuse   . Hyperparathyroidism, primary ([place]) 05/15/2012  . Thyroid nodule, uninodular, left lobe 05/15/2012  . Descending aortic aneurysm ([place]) 03/20/2008    Past Surgical History:  Procedure Laterality Date  . CARDIAC SURGERY     pt unsure of actual Sx - knows it was due to "anurysm"  . THROAT SURGERY         Home Medications    Prior to Admission medications   Medication Sig Start Date End Date Taking? Authorizing Provider  amLODipine (NORVASC) 10 MG tablet Take 1 tablet  (10 mg total) by mouth daily. Patient not taking: Reported on 09/27/2016 06/20/16   Dessa Phi Chahn-Yang, DO  Blood Pressure Monitoring (BLOOD PRESSURE CUFF) MISC Use blood pressure cuff to measure your blood pressure at the same time each day. Record each blood pressure and bring to your primary care physician. 06/20/16   Dessa Phi Chahn-Yang, DO  carvedilol (COREG) 6.25 MG tablet Take 1 tablet (6.25 mg total) by mouth 2 (two) times daily with a meal. Patient not taking: Reported on 09/27/2016 06/20/16   Dessa Phi Chahn-Yang, DO  hydrALAZINE (APRESOLINE) 50 MG tablet Take 1 tablet (50 mg total) by mouth every 8 (eight) hours. Patient not taking: Reported on 09/27/2016 06/20/16   Dessa Phi Chahn-Yang, DO    Family History Family History  Problem Relation Age of Onset  . Diabetes Mother   . Hypertension Father     Social History Social History  Substance Use Topics  . Smoking status: Never Smoker  . Smokeless tobacco: Never Used  . Alcohol use 21.0 oz/week    35 Cans of beer per week     Allergies   Patient has no known allergies.   Review of Systems Review of Systems  Constitutional: Negative for fever.  Eyes: Negative for visual disturbance.  Respiratory: Negative for shortness of breath.   Cardiovascular: Negative for leg swelling.  Gastrointestinal: Negative for abdominal pain.  Neurological: Negative for dizziness and headaches.  All other systems reviewed and are negative.    Physical Exam Updated Vital Signs BP (!) 193/138   Pulse 77   Temp 98.6 F (37 C) (Oral)   Resp (!) 24   SpO2 96%   Physical Exam  Constitutional: He appears well-developed and well-nourished.  HENT:  Head: Normocephalic.  Eyes: Pupils are equal, round, and reactive to light.  Neck: Normal range of motion.  Cardiovascular: Normal rate and regular rhythm.   Pulmonary/Chest: Effort normal.  Abdominal: Soft.  Musculoskeletal: Normal range of motion.  Neurological: He is  alert.  Skin: Skin is warm.  Psychiatric: He has a normal mood and affect.  Nursing note and vitals reviewed.    ED Treatments / Results  Labs (all labs ordered are listed, but only abnormal results are displayed) Labs Reviewed  CBC WITH DIFFERENTIAL/PLATELET - Abnormal; Notable for the following:       Result Value   Hemoglobin 12.0 (*)    HCT 35.8 (*)    Platelets 103 (*)    All other components within normal limits  COMPREHENSIVE METABOLIC PANEL - Abnormal; Notable for the following:    Potassium 3.1 (*)    CO2 19 (*)    BUN 46 (*)    Creatinine, Ser 6.04 (*)    Albumin 2.9 (*)    GFR calc non Af Amer 10 (*)    GFR calc Af Amer 11 (*)    All other components within normal limits  URINALYSIS, ROUTINE W REFLEX MICROSCOPIC    EKG  EKG Interpretation  Date/Time:  Wednesday September 27 2016 21:09:24 EDT Ventricular Rate:  85 PR Interval:    QRS Duration: 108 QT Interval:  417 QTC Calculation: 496 R Axis:   84 Text Interpretation:  Sinus rhythm Left atrial enlargement Left ventricular hypertrophy Borderline T abnormalities, lateral leads Borderline prolonged QT interval No significant change since last tracing Confirmed by Fredia Sorrow [phone]) on 09/27/2016 9:26:43 PM       Radiology Dg Chest 2 View  Result Date: 09/27/2016 CLINICAL DATA:  Shortness of breath, cough, sneezing EXAM: CHEST  2 VIEW COMPARISON:  06/19/2016 FINDINGS: Cardiomegaly. Prior median sternotomy. Mild perihilar and lower lobe interstitial prominence may reflect interstitial edema. No significant effusions. No acute bony abnormality. IMPRESSION: Cardiomegaly with vascular congestion and possible early interstitial edema. Electronically Signed   By: Rolm Baptise M.D.   On: 09/27/2016 21:44   Ct Head Wo Contrast  Result Date: 09/27/2016 CLINICAL DATA:  51 y/o  M; hypertension. EXAM: CT HEAD WITHOUT CONTRAST TECHNIQUE: Contiguous axial images were obtained from the base of the skull through the vertex  without intravenous contrast. COMPARISON:  None. FINDINGS: Brain: No evidence of acute infarction, hemorrhage, hydrocephalus, extra-axial collection or mass lesion/mass effect. Foci of hypoattenuation are present in subcortical white matter with predominantly bifrontal distribution likely representing mild chronic microvascular ischemic changes. Vascular: No hyperdense vessel or unexpected calcification. Skull: Normal. Negative for fracture or focal lesion. Sinuses/Orbits: No acute finding. Other: None. IMPRESSION: No acute intracranial abnormality. Mild chronic microvascular ischemic changes of the brain. Electronically Signed   By: Kristine Garbe M.D.   On: 09/27/2016 22:08    Procedures Procedures (including critical care time)  Medications Ordered in ED Medications  feeding supplement (PRO-STAT SUGAR FREE 64) liquid 30 mL (not administered)  hydrALAZINE (APRESOLINE) injection 10 mg (not administered)  0.9 %  sodium chloride infusion ( Intravenous New Bag/Given 09/27/16 2118)  labetalol (NORMODYNE,TRANDATE) injection 20 mg (20 mg Intravenous Given 09/27/16 2118)  nitroGLYCERIN 50 mg in dextrose 5 % 250 mL (0.2 mg/mL) infusion (30 mcg/min Intravenous Rate/Dose Change 09/27/16 2312)  sodium chloride 0.9 % bolus 250 mL (250 mLs Intravenous New Bag/Given 09/27/16 2251)     Initial Impression / Assessment and Plan / ED Course  I have reviewed the triage vital signs and the nursing notes.  Pertinent labs & imaging results that were available during my care of the patient were reviewed by me and considered in my medical decision making (see chart for details).      Patient has been poorly compliant with his antihypertensive today.  He was seen by his primary care doctor and sent for further evaluation for hypertensive emergency.  On evaluation of his lab work.  His creatinine has doubled to 6.04, and his GFR has decreased to 11 Patient was given initial dose of labetalol with no change in  his blood pressure.  I then transitioned him to a nitroglycerin drip and arrange for admission  Final Clinical Impressions(s) / ED Diagnoses   Final diagnoses:  Hypertensive emergency  Chronic renal impairment, stage 4 (severe) ([place])    New Prescriptions New Prescriptions   No medications on file     Junius Creamer, NP 09/27/16 [place], [place], MD 09/29/16 [phone]

## 2016-09-27 NOTE — H&P (Addendum)
TRH H&P   Patient Demographics:    Colin [name], is a 51 y.o. male  MRN: [id number]   DOB - [birth year]  Admit Date - 09/27/2016  Outpatient Primary MD for the patient is Patient, No Pcp Per  Referring MD/NP/PA: Delena Bali  Outpatient Specialists: Hassell Done Webb(nephrology)  Patient coming from: [facility] Phys[facility]  Chief Complaint  Patient presents with  . Hypertension      HPI:    Colin [name]  is a 51 y.o. male, w CKD stage4/5, Hypertension, apparently has been without medication >1 month, presented to [facility] today and was sent to ER for elevated BP.  Pt denies dizziness, headache, cp, palp, sob.  He is urinating fine per pt.  Pt denies nsaids or any diarrhea.   In ED, pt had bp 231/162.  Pt found to have ARF, bun/creat 46/6.04  K 3.1 Hgb 12.0  Pt initiated on nitro gtt.  Received labetalol 20mg  iv x1.   Pt will be admitted for ARF and hypokalemia and hypertensive emergency.     Review of systems:    In addition to the HPI above,  No Fever-chills, No Headache, No changes with Vision or hearing, No problems swallowing food or Liquids, No Chest pain, Cough or Shortness of Breath, No Abdominal pain, No Nausea or Vommitting, Bowel movements are regular, No Blood in stool or Urine, No dysuria, No new skin rashes or bruises, No new joints pains-aches,  No new weakness, tingling, numbness in any extremity, No recent weight gain or loss, No polyuria, polydypsia or polyphagia, No significant Mental Stressors.  A full 10 point Review of Systems was done, except as stated above, all other Review of Systems were negative.   With Past History of the following :    Past Medical History:  Diagnosis Date  . 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 Gilford Raid  . ETOH abuse   . 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 Hypertension   . Thyroid nodule        Past Surgical History:  Procedure Laterality Date  . CARDIAC SURGERY     pt unsure of actual Sx - knows it was due to "anurysm"  . THROAT SURGERY        Social History:     Social History  Substance Use Topics  . Smoking status: Never Smoker  . Smokeless tobacco: Never Used  . Alcohol use 21.0 oz/week    35 Cans of beer per week     Lives - at home , married Mob[employer] - walks by self  Family History :     Family History  Problem Relation Age of Onset  . Diabetes Mother   . Hypertension Father       Home Medications:   Prior to Admission medications   Medication Sig Start Date End Date Taking? Authorizing Provider  amLODipine (NORVASC) 10 MG  tablet Take 1 tablet (10 mg total) by mouth daily. Patient not taking: Reported on 09/27/2016 06/20/16   Dessa Phi Chahn-Yang, DO  Blood Pressure Monitoring (BLOOD PRESSURE CUFF) MISC Use blood pressure cuff to measure your blood pressure at the same time each day. Record each blood pressure and bring to your primary care physician. 06/20/16   Dessa Phi Chahn-Yang, DO  carvedilol (COREG) 6.25 MG tablet Take 1 tablet (6.25 mg total) by mouth 2 (two) times daily with a meal. Patient not taking: Reported on 09/27/2016 06/20/16   Dessa Phi Chahn-Yang, DO  hydrALAZINE (APRESOLINE) 50 MG tablet Take 1 tablet (50 mg total) by mouth every 8 (eight) hours. Patient not taking: Reported on 09/27/2016 06/20/16   Dessa Phi Chahn-Yang, DO     Allergies:    No Known Allergies   Physical Exam:   Vitals  Blood pressure (!) 199/135, pulse 74, temperature 98.6 F (37 C), temperature source Oral, resp. rate 20, SpO2 98 %.   1. General  lying in bed in NAD,   2. Normal affect and insight, Not Suicidal or Homicidal, Awake Alert, Oriented X 3.  3. No F.N deficits, ALL C.Nerves Intact, Strength 5/5 all 4 extremities, Sensation intact all 4 extremities, Plantars down going.  4. Ears and Eyes appear Normal, Conjunctivae clear,  PERRLA. Moist Oral Mucosa.  5. Supple Neck, No JVD, No cervical lymphadenopathy appriciated, No Carotid Bruits.  6. Symmetrical Chest wall movement, Good air movement bilaterally, CTAB.  7. RRR, No Gallops, Rubs or Murmurs, No Parasternal Heave.  8. Positive Bowel Sounds, Abdomen Soft, No tenderness, No organomegaly appriciated,No rebound -guarding or rigidity.  9.  No Cyanosis, Normal Skin Turgor, No Skin Rash or Bruise.  10. Good muscle tone,  joints appear normal , no effusions, Normal ROM.  11. No Palpable Lymph Nodes in Neck or Axillae     Data Review:    CBC  Recent Labs Lab 09/27/16 2115  WBC 5.7  HGB 12.0*  HCT 35.8*  PLT 103*  MCV 84.8  MCH 28.4  MCHC 33.5  RDW 14.9  LYMPHSABS 1.8  MONOABS 0.6  EOSABS 0.2  BASOSABS 0.0   ------------------------------------------------------------------------------------------------------------------  Chemistries   Recent Labs Lab 09/27/16 2115  NA 137  K 3.1*  CL 108  CO2 19*  GLUCOSE 93  BUN 46*  CREATININE 6.04*  CALCIUM 9.9  AST 40  ALT 57  ALKPHOS 117  BILITOT 0.9   ------------------------------------------------------------------------------------------------------------------ CrCl cannot be calculated (Unknown ideal weight.). ------------------------------------------------------------------------------------------------------------------ No results for input(s): TSH, T4TOTAL, T3FREE, THYROIDAB in the last 72 hours.  Invalid input(s): FREET3  Coagulation profile No results for input(s): INR, PROTIME in the last 168 hours. ------------------------------------------------------------------------------------------------------------------- No results for input(s): DDIMER in the last 72 hours. -------------------------------------------------------------------------------------------------------------------  Cardiac Enzymes No results for input(s): CKMB, TROPONINI, MYOGLOBIN in the last 168  hours.  Invalid input(s): CK ------------------------------------------------------------------------------------------------------------------    Component Value Date/Time   BNP 1,743.8 (H) 06/17/2016 1031     ---------------------------------------------------------------------------------------------------------------  Urinalysis    Component Value Date/Time   COLORURINE STRAW (A) 06/18/2016 1820   APPEARANCEUR CLEAR 06/18/2016 1820   LABSPEC 1.008 06/18/2016 1820   PHURINE 6.0 06/18/2016 1820   GLUCOSEU NEGATIVE 06/18/2016 1820   HGBUR NEGATIVE 06/18/2016 1820   BILIRUBINUR NEGATIVE 06/18/2016 1820   KETONESUR NEGATIVE 06/18/2016 1820   PROTEINUR 100 (A) 06/18/2016 1820   UROBILINOGEN 0.2 11/17/2008 1611   NITRITE NEGATIVE 06/18/2016 1820   LEUKOCYTESUR NEGATIVE 06/18/2016 1820    ----------------------------------------------------------------------------------------------------------------   Imaging Results:  Dg Chest 2 View  Result Date: 09/27/2016 CLINICAL DATA:  Shortness of breath, cough, sneezing EXAM: CHEST  2 VIEW COMPARISON:  06/19/2016 FINDINGS: Cardiomegaly. Prior median sternotomy. Mild perihilar and lower lobe interstitial prominence may reflect interstitial edema. No significant effusions. No acute bony abnormality. IMPRESSION: Cardiomegaly with vascular congestion and possible early interstitial edema. Electronically Signed   By: Rolm Baptise M.D.   On: 09/27/2016 21:44   Ct Head Wo Contrast  Result Date: 09/27/2016 CLINICAL DATA:  51 y/o  M; hypertension. EXAM: CT HEAD WITHOUT CONTRAST TECHNIQUE: Contiguous axial images were obtained from the base of the skull through the vertex without intravenous contrast. COMPARISON:  None. FINDINGS: Brain: No evidence of acute infarction, hemorrhage, hydrocephalus, extra-axial collection or mass lesion/mass effect. Foci of hypoattenuation are present in subcortical white matter with predominantly bifrontal  distribution likely representing mild chronic microvascular ischemic changes. Vascular: No hyperdense vessel or unexpected calcification. Skull: Normal. Negative for fracture or focal lesion. Sinuses/Orbits: No acute finding. Other: None. IMPRESSION: No acute intracranial abnormality. Mild chronic microvascular ischemic changes of the brain. Electronically Signed   By: Kristine Garbe M.D.   On: 09/27/2016 22:08      Assessment & Plan:    Active Problems:   Hypertensive emergency   Acute kidney injury ([place])   Anemia   Thrombocytopenia ([place])    Hypertensive emergency Start nitro gtt.  Restart his bp medications Hydralazine 10mg  iv q6h prn sbp >160  ARF Check urine sodium, urine creatinine, urine eosinophils Check spep, upep, Pth, magnesium, phos Check renal ultrasound AVOID NSAIDS Nephrology consult  Hypokalemia Replete Check cmp in am  Anemia Check cbc in am Check ferritin, iron, tibc, folate, b12, tsh  Thrombocytopenia Check cbc in am, if trending down then please check HIT Ab.   DVT Prophylaxis Heparin - SCDs   AM Labs Ordered, also please review Full Orders  Family Communication: Admission, patients condition and plan of care including tests being ordered have been discussed with the patient  who indicate understanding and agree with the plan and Code Status.  Code Status FULL CODE  Likely DC to  home  Condition GUARDED    Consults called: nephrology  Admission status: inpatient  Time spent in minutes : 45 minutes   Jani Gravel M.D on 09/27/2016 at 11:01 PM  Between 7pm to 7am - Pager - [phone] . After 7am go to [email or web address] - password [facility]  [facility] - Office  [phone]

## 2016-09-27 NOTE — ED Notes (Signed)
Admitting at bedside 

## 2016-09-27 NOTE — ED Triage Notes (Signed)
Pt presents with c/o hypertension. Pt stopped taking his blood pressure medication about 1 month ago because he could not afford to go to doctor to get a refill. He went into the doctors office today and was referred to ED for possible IV antihypertensives due to BP 200's/100s. He denies any complaints and states he feels fine. he is scheduled for nephrology appointment tomorrow for evaluation of hypertension.

## 2016-09-28 ENCOUNTER — Inpatient Hospital Stay (HOSPITAL_COMMUNITY): Payer: Managed Care, Other (non HMO)

## 2016-09-28 ENCOUNTER — Encounter (HOSPITAL_COMMUNITY): Payer: Self-pay

## 2016-09-28 DIAGNOSIS — D696 Thrombocytopenia, unspecified: Secondary | ICD-10-CM

## 2016-09-28 DIAGNOSIS — N184 Chronic kidney disease, stage 4 (severe): Secondary | ICD-10-CM

## 2016-09-28 DIAGNOSIS — N179 Acute kidney failure, unspecified: Secondary | ICD-10-CM

## 2016-09-28 DIAGNOSIS — D638 Anemia in other chronic diseases classified elsewhere: Secondary | ICD-10-CM

## 2016-09-28 DIAGNOSIS — I161 Hypertensive emergency: Principal | ICD-10-CM

## 2016-09-28 LAB — CBC
HCT: 29.3 % — ABNORMAL LOW (ref 39.0–52.0)
Hemoglobin: 9.7 g/dL — ABNORMAL LOW (ref 13.0–17.0)
MCH: 28 pg (ref 26.0–34.0)
MCHC: 33.1 g/dL (ref 30.0–36.0)
MCV: 84.7 fL (ref 78.0–100.0)
Platelets: 88 10*3/uL — ABNORMAL LOW (ref 150–400)
RBC: 3.46 MIL/uL — ABNORMAL LOW (ref 4.22–5.81)
RDW: 14.9 % (ref 11.5–15.5)
WBC: 6.1 10*3/uL (ref 4.0–10.5)

## 2016-09-28 LAB — COMPREHENSIVE METABOLIC PANEL
ALBUMIN: 2.4 g/dL — AB (ref 3.5–5.0)
ALK PHOS: 96 U/L (ref 38–126)
ALT: 43 U/L (ref 17–63)
AST: 28 U/L (ref 15–41)
Anion gap: 8 (ref 5–15)
BILIRUBIN TOTAL: 0.5 mg/dL (ref 0.3–1.2)
BUN: 45 mg/dL — AB (ref 6–20)
CALCIUM: 9.1 mg/dL (ref 8.9–10.3)
CO2: 21 mmol/L — ABNORMAL LOW (ref 22–32)
CREATININE: 5.82 mg/dL — AB (ref 0.61–1.24)
Chloride: 110 mmol/L (ref 101–111)
GFR calc Af Amer: 12 mL/min — ABNORMAL LOW (ref >60)
GFR calc non Af Amer: 10 mL/min — ABNORMAL LOW (ref >60)
GLUCOSE: 154 mg/dL — AB (ref 65–99)
Potassium: 2.7 mmol/L — CL (ref 3.5–5.1)
Sodium: 139 mmol/L (ref 135–145)
TOTAL PROTEIN: 5.5 g/dL — AB (ref 6.5–8.1)

## 2016-09-28 LAB — URINALYSIS, ROUTINE W REFLEX MICROSCOPIC
BILIRUBIN URINE: NEGATIVE
GLUCOSE, UA: 50 mg/dL — AB
Ketones, ur: NEGATIVE mg/dL
LEUKOCYTES UA: NEGATIVE
Nitrite: NEGATIVE
PH: 6 (ref 5.0–8.0)
Protein, ur: >=300 mg/dL — AB
SPECIFIC GRAVITY, URINE: 1.011 (ref 1.005–1.030)
SQUAMOUS EPITHELIAL / LPF: NONE SEEN

## 2016-09-28 LAB — IRON AND TIBC
Iron: 39 ug/dL — ABNORMAL LOW (ref 45–182)
Saturation Ratios: 18 % (ref 17.9–39.5)
TIBC: 221 ug/dL — ABNORMAL LOW (ref 250–450)
UIBC: 182 ug/dL

## 2016-09-28 LAB — MAGNESIUM: MAGNESIUM: 2.2 mg/dL (ref 1.7–2.4)

## 2016-09-28 LAB — TSH: TSH: 1.548 u[IU]/mL (ref 0.350–4.500)

## 2016-09-28 LAB — PROTEIN / CREATININE RATIO, URINE
CREATININE, URINE: 81.17 mg/dL
Protein Creatinine Ratio: 3.82 mg/mg{Cre} — ABNORMAL HIGH (ref 0.00–0.15)
Total Protein, Urine: 310 mg/dL

## 2016-09-28 LAB — VITAMIN B12: Vitamin B-12: 775 pg/mL (ref 180–914)

## 2016-09-28 LAB — PHOSPHORUS: Phosphorus: 2.9 mg/dL (ref 2.5–4.6)

## 2016-09-28 LAB — MRSA PCR SCREENING: MRSA by PCR: NEGATIVE

## 2016-09-28 LAB — FERRITIN: FERRITIN: 295 ng/mL (ref 24–336)

## 2016-09-28 LAB — SODIUM, URINE, RANDOM: SODIUM UR: 99 mmol/L

## 2016-09-28 MED ORDER — CARVEDILOL 6.25 MG PO TABS
6.2500 mg | ORAL_TABLET | Freq: Two times a day (BID) | ORAL | Status: DC
  Administered 2016-09-28 (×2): 6.25 mg via ORAL
  Filled 2016-09-28 (×2): qty 1

## 2016-09-28 MED ORDER — ACETAMINOPHEN 325 MG PO TABS
650.0000 mg | ORAL_TABLET | Freq: Four times a day (QID) | ORAL | Status: DC | PRN
  Administered 2016-09-28 (×2): 650 mg via ORAL
  Filled 2016-09-28 (×2): qty 2

## 2016-09-28 MED ORDER — LABETALOL HCL 5 MG/ML IV SOLN
20.0000 mg | Freq: Once | INTRAVENOUS | Status: AC
Start: 2016-09-28 — End: 2016-09-28
  Administered 2016-09-28: 20 mg via INTRAVENOUS
  Filled 2016-09-28: qty 4

## 2016-09-28 MED ORDER — HYDRALAZINE HCL 50 MG PO TABS
50.0000 mg | ORAL_TABLET | Freq: Three times a day (TID) | ORAL | Status: DC
  Administered 2016-09-28 – 2016-09-30 (×8): 50 mg via ORAL
  Filled 2016-09-28 (×8): qty 1

## 2016-09-28 MED ORDER — POTASSIUM CHLORIDE CRYS ER 20 MEQ PO TBCR
20.0000 meq | EXTENDED_RELEASE_TABLET | Freq: Once | ORAL | Status: AC
  Administered 2016-09-28: 20 meq via ORAL
  Filled 2016-09-28: qty 1

## 2016-09-28 MED ORDER — HEPARIN SODIUM (PORCINE) 5000 UNIT/ML IJ SOLN: 5000.0000 [IU] | Freq: Three times a day (TID) | INTRAMUSCULAR | Status: DC

## 2016-09-28 MED ORDER — KIDNEY FAILURE BOOK
Freq: Once | Status: DC
  Filled 2016-09-28 (×2): qty 1

## 2016-09-28 MED ORDER — ACETAMINOPHEN 650 MG RE SUPP: 650.0000 mg | Freq: Four times a day (QID) | RECTAL | Status: DC | PRN

## 2016-09-28 MED ORDER — AMLODIPINE BESYLATE 10 MG PO TABS
10.0000 mg | ORAL_TABLET | Freq: Every day | ORAL | Status: DC
  Administered 2016-09-28 – 2016-09-30 (×3): 10 mg via ORAL
  Filled 2016-09-28 (×3): qty 1

## 2016-09-28 MED ORDER — SODIUM CHLORIDE 0.9 % IV SOLN
INTRAVENOUS | Status: DC
  Administered 2016-09-28 (×2): via INTRAVENOUS

## 2016-09-28 MED ORDER — SODIUM CHLORIDE 0.9 % IV SOLN
510.0000 mg | INTRAVENOUS | Status: DC
  Administered 2016-09-28: 510 mg via INTRAVENOUS
  Filled 2016-09-28: qty 17

## 2016-09-28 MED ORDER — SODIUM CHLORIDE 0.9% FLUSH
3.0000 mL | Freq: Two times a day (BID) | INTRAVENOUS | Status: DC
  Administered 2016-09-28 – 2016-09-30 (×6): 3 mL via INTRAVENOUS

## 2016-09-28 NOTE — Progress Notes (Addendum)
09/28/2016 Patient c/o chest pain and shortness of breath at 1915. Pain was a 4. He was place on 2 Liters oxygen and a 12 lead EKG was done at 1924 it showed Normal  Sinus rhythm and vent rate 89. Patient was reassess pain decrease to 1. Patient Systolic blood pressure in the 861'U and Diastolic 837'G. Patient was given Hydralazine 10 Mg IV. Colin [name] was notified at 5 via [place]. Orders were given  at 2100 for EKG to be seen in Epic,to take Blood pressure manually and text result to K. [name](NP)  Lilia Pro (RN) was made aware. Rico Sheehan RN

## 2016-09-28 NOTE — Progress Notes (Signed)
CRITICAL VALUE ALERT  Critical Value:  Potassium (2.7)  Date & Time Notied:  09/28/2016 @0733   Provider Notified: Dr [name] @ [phone]  Orders Received/Actions taken: Order was given for one time dose potassium.

## 2016-09-28 NOTE — Consult Note (Addendum)
Reason for Consult: AKI/CKD Referring Physician: [name], MD  Colin [name] is an 51 y.o. male.  HPI: Mr. Schmieg is a 51yo African male with PMH significant for HTN, descending aortic aneurysm (s/p repair in 2010 by Dr. [name]), multinodular goiter, CKD stage 3 (baseline Scr was 1.6-1.8 in 2016) and medical noncompliance who was just admitted to [facility] in April 2018 with hypertensive urgency after he stopped taking his BP medications.  His Scr peaked at 4.6 at that time and improved to 3.96 and was set up to see Dr. Justin Mend in our office the next week, however he did not show and has not been seen since 06/02/14.  He again presented to [facility] yesterday after being sent there by his PCP due to markedly elevated BP of  231/162.  He again ran out of his BP medications prior to readmission.  He reports that he has had a tough year after losing his sister and then his mother, both died of complications of diabetes and were living in [place].  The trend in Scr is seen below.  He states that he did not get his medications filled after his last hospitalization due to an outstanding balance at his previous PCP's office and missed his appointment with Dr. Justin Mend since he had to go to [place] for his mother's funeral, although she apparently died in 06-02-22.  He now has a new PCP and states that he will keep his future appointments with Dr. Justin Mend and Dr. Deforest Hoyles.    He denies any N/V/anorexia but did have a headache at time of admission.  No SSCP or SOB.  He also denies any dysuria, pyuria, urgency, or retention but did notice some blood clots in his urine prior to admission.    Trend in Creatinine: Creatinine, Ser  Date/Time Value Ref Range Status  09/28/2016 04:31 AM 5.82 (H) 0.61 - 1.24 mg/dL Final  09/27/2016 09:15 PM 6.04 (H) 0.61 - 1.24 mg/dL Final  06/20/2016 07:12 AM 3.96 (H) 0.61 - 1.24 mg/dL Final  06/19/2016 02:54 AM 4.00 (H) 0.61 - 1.24 mg/dL Final  06/18/2016 02:32 AM 4.22 (H) 0.61 - 1.24  mg/dL Final  06/18/2016 02:32 AM 4.22 (H) 0.61 - 1.24 mg/dL Final  06/17/2016 07:16 PM 4.17 (H) 0.61 - 1.24 mg/dL Final  06/17/2016 11:13 AM 4.19 (H) 0.61 - 1.24 mg/dL Final  06/17/2016 07:48 AM 4.60 (H) 0.61 - 1.24 mg/dL Final  03/04/2009 12:07 PM 1.35 0.4 - 1.5 mg/dL Final  11/18/2008 03:35 AM 1.62 (H) 0.4 - 1.5 mg/dL Final  11/16/2008 03:59 AM 1.68 (H) 0.4 - 1.5 mg/dL Final  11/15/2008 04:36 AM 1.56 (H) 0.4 - 1.5 mg/dL Final  11/14/2008 04:34 AM 1.88 (H) 0.4 - 1.5 mg/dL Final  11/13/2008 04:35 PM 1.92 (H) 0.4 - 1.5 mg/dL Final  11/13/2008 04:35 PM 1.76 (H) 0.4 - 1.5 mg/dL Final  11/13/2008 03:45 AM 2.02 (H) 0.4 - 1.5 mg/dL Final  11/12/2008 09:25 PM 1.6 (H) 0.4 - 1.5 mg/dL Final  11/12/2008 09:00 PM 1.68 (H) 0.4 - 1.5 mg/dL Final  11/10/2008 04:21 PM 1.77 (H) 0.4 - 1.5 mg/dL Final    PMH:   Past Medical History:  Diagnosis Date  . Descending aortic aneurysm ([place]) 2010   repaired by Dr. Gilford Raid  . ETOH abuse   . 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 Hypertension   . Thyroid nodule     PSH:   Past Surgical History:  Procedure Laterality Date  . CARDIAC SURGERY  pt unsure of actual Sx - knows it was due to "anurysm"  . THROAT SURGERY      Allergies: No Known Allergies  Medications:   Prior to Admission medications   Medication Sig Start Date End Date Taking? Authorizing Provider  amLODipine (NORVASC) 10 MG tablet Take 1 tablet (10 mg total) by mouth daily. Patient not taking: Reported on 09/27/2016 06/20/16   Dessa Phi Chahn-Yang, DO  Blood Pressure Monitoring (BLOOD PRESSURE CUFF) MISC Use blood pressure cuff to measure your blood pressure at the same time each day. Record each blood pressure and bring to your primary care physician. 06/20/16   Dessa Phi Chahn-Yang, DO  carvedilol (COREG) 6.25 MG tablet Take 1 tablet (6.25 mg total) by mouth 2 (two) times daily with a meal. Patient not taking: Reported on 09/27/2016 06/20/16   Dessa Phi Chahn-Yang, DO   hydrALAZINE (APRESOLINE) 50 MG tablet Take 1 tablet (50 mg total) by mouth every 8 (eight) hours. Patient not taking: Reported on 09/27/2016 06/20/16   Shon Millet, DO    Inpatient medications: . amLODipine  10 mg Oral Daily  . carvedilol  6.25 mg Oral BID WC  . feeding supplement (PRO-STAT SUGAR FREE 64)  30 mL Oral BID  . hydrALAZINE  50 mg Oral Q8H  . sodium chloride flush  3 mL Intravenous Q12H    Discontinued Meds:   Medications Discontinued During This Encounter  Medication Reason  . heparin injection 5,000 Units     Social History:  reports that he has never smoked. He has never used smokeless tobacco. He reports that he drinks about 21.0 oz of alcohol per week . He reports that he does not use drugs.  Family History:   Family History  Problem Relation Age of Onset  . Diabetes Mother   . Hypertension Father     Pertinent items are noted in HPI. Weight change:   Intake/Output Summary (Last 24 hours) at 09/28/16 1345 Last data filed at 09/28/16 1209  Gross per 24 hour  Intake           826.35 ml  Output              800 ml  Net            26.35 ml   BP (!) 158/115   Pulse 77   Temp 98.3 F (36.8 C) (Oral)   Resp 15   Ht 5\' 10"  (1.778 m)   Wt 85.1 kg (187 lb 9.8 oz)   SpO2 99%   BMI 26.92 kg/m  Vitals:   09/28/16 0400 09/28/16 0516 09/28/16 0730 09/28/16 1208  BP: (!) 160/101 (!) 158/115    Pulse: 77     Resp: 15     Temp:   97.8 F (36.6 C) 98.3 F (36.8 C)  TempSrc:   Other (Comment) Oral  SpO2: 99%     Weight:      Height:         General appearance: alert, cooperative and no distress Head: Normocephalic, without obvious abnormality, atraumatic Neck: no carotid bruit Resp: clear to auscultation bilaterally Cardio: regular rate and rhythm, S1, S2 normal, no murmur, click, rub or gallop GI: soft, non-tender; bowel sounds normal; no masses,  no organomegaly Extremities: extremities normal, atraumatic, no cyanosis or  edema  Labs: Basic Metabolic Panel:  Recent Labs Lab 09/27/16 2115 09/28/16 0431  NA 137 139  K 3.1* 2.7*  CL 108 110  CO2 19* 21*  GLUCOSE 93  154*  BUN 46* 45*  CREATININE 6.04* 5.82*  ALBUMIN 2.9* 2.4*  CALCIUM 9.9 9.1  PHOS  --  2.9   Liver Function Tests:  Recent Labs Lab 09/27/16 2115 09/28/16 0431  AST 40 28  ALT 57 43  ALKPHOS 117 96  BILITOT 0.9 0.5  PROT 6.8 5.5*  ALBUMIN 2.9* 2.4*   No results for input(s): LIPASE, AMYLASE in the last 168 hours. No results for input(s): AMMONIA in the last 168 hours. CBC:  Recent Labs Lab 09/27/16 2115 09/28/16 0431  WBC 5.7 6.1  NEUTROABS 3.1  --   HGB 12.0* 9.7*  HCT 35.8* 29.3*  MCV 84.8 84.7  PLT 103* 88*   PT/INR: @LABRCNTIP (inr:5) Cardiac Enzymes: )No results for input(s): CKTOTAL, CKMB, CKMBINDEX, TROPONINI in the last 168 hours. CBG: No results for input(s): GLUCAP in the last 168 hours.  Iron Studies:  Recent Labs Lab 09/28/16 0431  IRON 39*  TIBC 221*  FERRITIN 295    Xrays/Other Studies: Dg Chest 2 View  Result Date: 09/27/2016 CLINICAL DATA:  Shortness of breath, cough, sneezing EXAM: CHEST  2 VIEW COMPARISON:  06/19/2016 FINDINGS: Cardiomegaly. Prior median sternotomy. Mild perihilar and lower lobe interstitial prominence may reflect interstitial edema. No significant effusions. No acute bony abnormality. IMPRESSION: Cardiomegaly with vascular congestion and possible early interstitial edema. Electronically Signed   By: Rolm Baptise M.D.   On: 09/27/2016 21:44   Ct Head Wo Contrast  Result Date: 09/27/2016 CLINICAL DATA:  51 y/o  M; hypertension. EXAM: CT HEAD WITHOUT CONTRAST TECHNIQUE: Contiguous axial images were obtained from the base of the skull through the vertex without intravenous contrast. COMPARISON:  None. FINDINGS: Brain: No evidence of acute infarction, hemorrhage, hydrocephalus, extra-axial collection or mass lesion/mass effect. Foci of hypoattenuation are present in  subcortical white matter with predominantly bifrontal distribution likely representing mild chronic microvascular ischemic changes. Vascular: No hyperdense vessel or unexpected calcification. Skull: Normal. Negative for fracture or focal lesion. Sinuses/Orbits: No acute finding. Other: None. IMPRESSION: No acute intracranial abnormality. Mild chronic microvascular ischemic changes of the brain. Electronically Signed   By: Kristine Garbe M.D.   On: 09/27/2016 22:08   US Renal  Result Date: 09/28/2016 CLINICAL DATA:  Initial evaluation for acute on chronic renal failure. EXAM: RENAL / URINARY TRACT ULTRASOUND COMPLETE COMPARISON:  Prior CT from 06/17/2016. FINDINGS: Right Kidney: Length: 11.2 cm. Diffusely increased echogenicity, compatible with medical renal disease. No hydronephrosis. 2 small subcentimeter cysts noted. Left Kidney: Length: 10.5 cm. Diffusely increased echogenicity, compatible with medical renal disease. No hydronephrosis. Two small cysts measuring up to 1 cm noted. Bladder: Appears normal for degree of bladder distention. Prostate enlarged measuring 4.2 x 3.7 x 5.2 cm. IMPRESSION: 1. Diffusely increased echogenicity within the renal parenchyma, consistent with underlying medical renal disease. No hydronephrosis. 2. Enlarged prostate. Electronically Signed   By: Jeannine Boga M.D.   On: 09/28/2016 05:49     Assessment/Plan: 1.  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  His baseline in 2016 had been stable at 1.6-1.8 but he has likely progressed to stage 4-5 due to uncontrolled HTN for the last 2 years.  I had a lengthy discussion regarding the need for compliance with medications and medical follow up.  No indication for dialysis at this time, however he will need close follow up and preparation for impending need for dialysis.  2. Hypertensive emergency/urgency-  responding to  his outpatient medications, however still not at goal.  Continue with current regimen and follow BP and renal function.  Titrate coreg for goal BP <130/80 as HR tolerates 1. Recommend stopping IVF"s given CXR and BP 3. Anemia of CKD stage 4- low iron stores and will start IV Feraheme but will likely require initiation of ESA therapy 4. Secondary HPTH- phos and iPTH levels pending 5. Hypokalemia- will check renin/aldo levels. Replete for now and follow 6. Thrombocytopenia- possibly due to hypertensive urgency but may also need to image his spleen and pursue further work up per primary team 7. Medical noncompliance- as above.  Case manager to assist.   Donetta Potts 09/28/2016, 1:45 PM

## 2016-09-28 NOTE — Progress Notes (Signed)
[facility] PROGRESS NOTE  Colin [name] OEU:[id number] DOB: 01-31-1966 DOA: 09/27/2016  PCP: Patient, No Pcp Per  Brief History/Interval Summary: 51 year old Black male with past medical history of chronic kidney disease stage 4 to 5, hypertension, noncompliance, presented to the emergency department from his primary care physician's office with elevated blood pressure. Blood pressure systolic was greater than 200. He was also found to have worsening creatinine. He was started on nitroglycerin infusion and was hospitalized for further management.  Reason for Visit: Hypertensive emergency. Acute on chronic kidney disease  Consultants: Nephrology  Procedures: None  Antibiotics: None  Subjective/Interval History: Patient feels better this morning. Denies any chest pain, shortness of breath. Has been urinating.  ROS: Denies nausea or vomiting  Objective:  Vital Signs  Vitals:   09/28/16 0359 09/28/16 0400 09/28/16 0516 09/28/16 0730  BP:  (!) 160/101 (!) 158/115   Pulse:  77    Resp:  15    Temp: (!) 97.5 F (36.4 C)   97.8 F (36.6 C)  TempSrc: Oral   Other (Comment)  SpO2:  99%    Weight:      Height:        Intake/Output Summary (Last 24 hours) at 09/28/16 1201 Last data filed at 09/28/16 1540  Gross per 24 hour  Intake           826.35 ml  Output              250 ml  Net           576.35 ml   Filed Weights   09/28/16 0245  Weight: 85.1 kg (187 lb 9.8 oz)    General appearance: alert, cooperative, appears stated age and no distress Head: Normocephalic, without obvious abnormality, atraumatic Resp: clear to auscultation bilaterally Cardio: regular rate and rhythm, S1, S2 normal, no murmur, click, rub or gallop GI: soft, non-tender; bowel sounds normal; no masses,  no organomegaly Extremities: extremities normal, atraumatic, no cyanosis or edema Neurologic: Awake and alert. Oriented 8. No focal neurological deficits.  Lab Results:  Data  Reviewed: I have personally reviewed following labs and imaging studies  CBC:  Recent Labs Lab 09/27/16 2115 09/28/16 0431  WBC 5.7 6.1  NEUTROABS 3.1  --   HGB 12.0* 9.7*  HCT 35.8* 29.3*  MCV 84.8 84.7  PLT 103* 88*    Basic Metabolic Panel:  Recent Labs Lab 09/27/16 2115 09/28/16 0431  NA 137 139  K 3.1* 2.7*  CL 108 110  CO2 19* 21*  GLUCOSE 93 154*  BUN 46* 45*  CREATININE 6.04* 5.82*  CALCIUM 9.9 9.1  MG  --  2.2  PHOS  --  2.9    GFR: Estimated Creatinine Clearance: 15.3 mL/min (A) (by C-G formula based on SCr of 5.82 mg/dL (H)).  Liver Function Tests:  Recent Labs Lab 09/27/16 2115 09/28/16 0431  AST 40 28  ALT 57 43  ALKPHOS 117 96  BILITOT 0.9 0.5  PROT 6.8 5.5*  ALBUMIN 2.9* 2.4*    Thyroid Function Tests:  Recent Labs  09/28/16 0431  TSH 1.548    Anemia Panel:  Recent Labs  09/28/16 0431  VITAMINB12 775  FERRITIN 295  TIBC 221*  IRON 39*    Recent Results (from the past 240 hour(s))  MRSA PCR Screening     Status: None   Collection Time: 09/28/16  2:39 AM  Result Value Ref Range Status   MRSA by PCR NEGATIVE NEGATIVE Final  Comment:        The GeneXpert MRSA Assay (FDA approved for NASAL specimens only), is one component of a comprehensive MRSA colonization surveillance program. It is not intended to diagnose MRSA infection nor to guide or monitor treatment for MRSA infections.       Radiology Studies: Dg Chest 2 View  Result Date: 09/27/2016 CLINICAL DATA:  Shortness of breath, cough, sneezing EXAM: CHEST  2 VIEW COMPARISON:  06/19/2016 FINDINGS: Cardiomegaly. Prior median sternotomy. Mild perihilar and lower lobe interstitial prominence may reflect interstitial edema. No significant effusions. No acute bony abnormality. IMPRESSION: Cardiomegaly with vascular congestion and possible early interstitial edema. Electronically Signed   By: Rolm Baptise M.D.   On: 09/27/2016 21:44   Ct Head Wo Contrast  Result  Date: 09/27/2016 CLINICAL DATA:  51 y/o  M; hypertension. EXAM: CT HEAD WITHOUT CONTRAST TECHNIQUE: Contiguous axial images were obtained from the base of the skull through the vertex without intravenous contrast. COMPARISON:  None. FINDINGS: Brain: No evidence of acute infarction, hemorrhage, hydrocephalus, extra-axial collection or mass lesion/mass effect. Foci of hypoattenuation are present in subcortical white matter with predominantly bifrontal distribution likely representing mild chronic microvascular ischemic changes. Vascular: No hyperdense vessel or unexpected calcification. Skull: Normal. Negative for fracture or focal lesion. Sinuses/Orbits: No acute finding. Other: None. IMPRESSION: No acute intracranial abnormality. Mild chronic microvascular ischemic changes of the brain. Electronically Signed   By: Kristine Garbe M.D.   On: 09/27/2016 22:08   US Renal  Result Date: 09/28/2016 CLINICAL DATA:  Initial evaluation for acute on chronic renal failure. EXAM: RENAL / URINARY TRACT ULTRASOUND COMPLETE COMPARISON:  Prior CT from 06/17/2016. FINDINGS: Right Kidney: Length: 11.2 cm. Diffusely increased echogenicity, compatible with medical renal disease. No hydronephrosis. 2 small subcentimeter cysts noted. Left Kidney: Length: 10.5 cm. Diffusely increased echogenicity, compatible with medical renal disease. No hydronephrosis. Two small cysts measuring up to 1 cm noted. Bladder: Appears normal for degree of bladder distention. Prostate enlarged measuring 4.2 x 3.7 x 5.2 cm. IMPRESSION: 1. Diffusely increased echogenicity within the renal parenchyma, consistent with underlying medical renal disease. No hydronephrosis. 2. Enlarged prostate. Electronically Signed   By: Jeannine Boga M.D.   On: 09/28/2016 05:49     Medications:  Scheduled: . amLODipine  10 mg Oral Daily  . carvedilol  6.25 mg Oral BID WC  . feeding supplement (PRO-STAT SUGAR FREE 64)  30 mL Oral BID  . hydrALAZINE  50  mg Oral Q8H  . sodium chloride flush  3 mL Intravenous Q12H   Continuous: . sodium chloride 100 mL/hr at 09/28/16 3662   HUT:[id number] **OR** acetaminophen, hydrALAZINE  Assessment/Plan:  Active Problems:   Hypertensive emergency   Acute kidney injury ([place])   Anemia   Thrombocytopenia ([place])   ARF (acute renal failure) ([place])    Hypertensive emergency. Patient with significantly elevated blood pressures at the time of admission with worsening renal function. Patient was placed on nitroglycerin infusion. Blood pressures have improved. He has been started back on his usual home medication regimen. Continue the same for now. Nitroglycerin infusion has been turned off for now. Continue to monitor closely. Appears to be asymptomatic currently.  Acute on chronic kidney disease, stage IV. Creatinine was around 4.0 few months ago. It was 6 at the time of admission. Protein creatinine ratio is in the nephrotic range. Patient has seen nephrology about 3-4 years ago. Renal failure is most likely secondary to uncontrolled hypertension. Discussed with nephrology who will evaluate patient. Renal  ultrasound as above. Medical renal disease noted. No hydronephrosis. Prostate enlargement noted. Monitor urine output.   Normocytic anemia. Hemoglobin noted to be lower compared to previous values. He was between 12 and 13 few months ago. It was 12 at the time of admission but dropped to 9.7 today. This drop is likely dilutional. No overt bleeding has been noted. He likely has anemia of chronic disease due to his kidney disease. Iron was 39. Ferritin 295.  Thrombocytopenia. Reason for low platelet counts, not entirely clear. No overt bleeding. Continue to monitor for now.  Hypokalemia. He'll be given a small dose of potassium. Further management per nephrology.  Noncompliance Patient has insurance and despite this he has not been compliant with his medications. This was discussed with the patient. He  has had some family stressors recently. Apparently there have been some deaths in his family recently. He's also had some financial issues despite having insurance and as a result has not been able to get his medications. We will have case manager discuss this with him.  DVT Prophylaxis: SCDs    Code Status: Full code  Family Communication: Discussed with the patient  Disposition Plan: Management as outlined above    LOS: 1 day   [facility] Pager [phone] 09/28/2016, 12:01 PM  If 7PM-7AM, please contact night-coverage at [email or web address], password [facility]

## 2016-09-28 NOTE — ED Notes (Signed)
Attempted report x1. 

## 2016-09-29 DIAGNOSIS — D649 Anemia, unspecified: Secondary | ICD-10-CM

## 2016-09-29 DIAGNOSIS — R079 Chest pain, unspecified: Secondary | ICD-10-CM

## 2016-09-29 DIAGNOSIS — Z8679 Personal history of other diseases of the circulatory system: Secondary | ICD-10-CM

## 2016-09-29 DIAGNOSIS — R748 Abnormal levels of other serum enzymes: Secondary | ICD-10-CM

## 2016-09-29 LAB — RENAL FUNCTION PANEL
Albumin: 2.3 g/dL — ABNORMAL LOW (ref 3.5–5.0)
Anion gap: 8 (ref 5–15)
BUN: 42 mg/dL — AB (ref 6–20)
CHLORIDE: 111 mmol/L (ref 101–111)
CO2: 19 mmol/L — AB (ref 22–32)
CREATININE: 5.21 mg/dL — AB (ref 0.61–1.24)
Calcium: 9.2 mg/dL (ref 8.9–10.3)
GFR calc non Af Amer: 12 mL/min — ABNORMAL LOW (ref >60)
GFR, EST AFRICAN AMERICAN: 13 mL/min — AB (ref >60)
GLUCOSE: 106 mg/dL — AB (ref 65–99)
Phosphorus: 3.7 mg/dL (ref 2.5–4.6)
Potassium: 3.2 mmol/L — ABNORMAL LOW (ref 3.5–5.1)
Sodium: 138 mmol/L (ref 135–145)

## 2016-09-29 LAB — CBC
HCT: 31 % — ABNORMAL LOW (ref 39.0–52.0)
Hemoglobin: 10.1 g/dL — ABNORMAL LOW (ref 13.0–17.0)
MCH: 28.4 pg (ref 26.0–34.0)
MCHC: 32.6 g/dL (ref 30.0–36.0)
MCV: 87.1 fL (ref 78.0–100.0)
PLATELETS: 96 10*3/uL — AB (ref 150–400)
RBC: 3.56 MIL/uL — ABNORMAL LOW (ref 4.22–5.81)
RDW: 15.4 % (ref 11.5–15.5)
WBC: 7 10*3/uL (ref 4.0–10.5)

## 2016-09-29 LAB — PARATHYROID HORMONE, INTACT (NO CA): PTH: 519 pg/mL — ABNORMAL HIGH (ref 15–65)

## 2016-09-29 LAB — CALCIUM / CREATININE RATIO, URINE
Calcium, Ur: 3.2 mg/dL
Calcium/Creat.Ratio: 43 mg/g creat (ref 0–260)
Creatinine, Urine: 74.3 mg/dL

## 2016-09-29 LAB — FOLATE RBC
FOLATE, HEMOLYSATE: 292.1 ng/mL
FOLATE, RBC: 994 ng/mL (ref >498)
HEMATOCRIT: 29.4 % — AB (ref 37.5–51.0)

## 2016-09-29 LAB — PROTEIN ELECTROPHORESIS, SERUM
A/G Ratio: 0.8 (ref 0.7–1.7)
ALBUMIN ELP: 2.4 g/dL — AB (ref 2.9–4.4)
ALPHA-1-GLOBULIN: 0.2 g/dL (ref 0.0–0.4)
Alpha-2-Globulin: 0.7 g/dL (ref 0.4–1.0)
BETA GLOBULIN: 1 g/dL (ref 0.7–1.3)
GAMMA GLOBULIN: 1 g/dL (ref 0.4–1.8)
Globulin, Total: 2.9 g/dL (ref 2.2–3.9)
Total Protein ELP: 5.3 g/dL — ABNORMAL LOW (ref 6.0–8.5)

## 2016-09-29 LAB — TROPONIN I
Troponin I: 0.13 ng/mL (ref <0.03)
Troponin I: 0.14 ng/mL (ref <0.03)
Troponin I: 0.12 ng/mL (ref <0.03)

## 2016-09-29 LAB — HEPATITIS B SURFACE ANTIGEN: Hepatitis B Surface Ag: NEGATIVE

## 2016-09-29 MED ORDER — POTASSIUM CHLORIDE CRYS ER 20 MEQ PO TBCR
40.0000 meq | EXTENDED_RELEASE_TABLET | Freq: Once | ORAL | Status: AC
  Administered 2016-09-29: 40 meq via ORAL
  Filled 2016-09-29: qty 2

## 2016-09-29 MED ORDER — ZOLPIDEM TARTRATE 5 MG PO TABS: 5.0000 mg | ORAL_TABLET | Freq: Once | ORAL | Status: DC

## 2016-09-29 MED ORDER — FUROSEMIDE 40 MG PO TABS
40.0000 mg | ORAL_TABLET | Freq: Two times a day (BID) | ORAL | Status: DC
Start: 2016-09-29 — End: 2016-09-30
  Administered 2016-09-29 – 2016-09-30 (×3): 40 mg via ORAL
  Filled 2016-09-29 (×3): qty 1

## 2016-09-29 MED ORDER — NITROGLYCERIN 2 % TD OINT
1.0000 [in_us] | TOPICAL_OINTMENT | Freq: Four times a day (QID) | TRANSDERMAL | Status: DC
  Administered 2016-09-29 – 2016-09-30 (×7): 1 [in_us] via TOPICAL
  Filled 2016-09-29 (×2): qty 30

## 2016-09-29 MED ORDER — ASPIRIN EC 81 MG PO TBEC
81.0000 mg | DELAYED_RELEASE_TABLET | Freq: Every day | ORAL | Status: DC
  Administered 2016-09-29 – 2016-09-30 (×2): 81 mg via ORAL
  Filled 2016-09-29 (×2): qty 1

## 2016-09-29 MED ORDER — CLONIDINE HCL 0.1 MG PO TABS
0.1000 mg | ORAL_TABLET | Freq: Once | ORAL | Status: AC
  Administered 2016-09-29: 0.1 mg via ORAL
  Filled 2016-09-29: qty 1

## 2016-09-29 MED ORDER — CARVEDILOL 12.5 MG PO TABS
12.5000 mg | ORAL_TABLET | Freq: Two times a day (BID) | ORAL | Status: DC
  Administered 2016-09-29 – 2016-09-30 (×3): 12.5 mg via ORAL
  Filled 2016-09-29 (×3): qty 1

## 2016-09-29 NOTE — Consult Note (Signed)
Cardiology Consultation:   Patient ID: Colin [name]; [id number]; [birth year]   Admit date: 09/27/2016 Date of Consult: 09/29/2016  Primary Care Provider: Patient, No Pcp Per Primary Cardiologist: New/ Tanvir Hipple Primary Electrophysiologist:  None   Patient Profile:   Colin [name] is a 51 y.o. male with a hx of CRF and HTN  who is being seen today for the evaluation of positive troponin and chest pain  at the request of Dr [name].  History of Present Illness:   Colin [name] 51 y.o. grom [place]. Long standing history of HTN poorly Rx. Ran out of meds more than a month ago. Seen by primary with hypertensive urgency and sent to hospital where systolic BP over 277.  Now on 3 drug Rx with improvement. No history of CAD. Indicated 5 minutes of SSCP in room yesterday when he got very cold from [facility] and had been shivering No further SSCP  ECG with no acute changes LVH.  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 Dr [name] repaired this and noted tri leaflet aortic valve and valve sparing surgery done.  Do to the nature of his dissection pre op cath was not performed He is a nonsmoker with no family history of CAD.   Reviewed echo from this admission no RWMA Study Conclusions  - Left ventricle: inferobasal hypokinesis The cavity size was   mildly dilated. Wall thickness was increased in a pattern of mild   LVH. Systolic function was normal. The estimated ejection   fraction was in the range of 50% to 55%. Wall motion was normal;   there were no regional wall motion abnormalities. Doppler   parameters are consistent with both elevated ventricular   end-diastolic filling pressure and elevated left atrial filling   pressure. - Mitral valve: There was mild regurgitation. - Left atrium: The atrium was moderately dilated. - Right  atrium: The atrium was mildly dilated. - Atrial septum: No defect or patent foramen ovale was identified.  He is asymptomatic at this time still making urine but Cr has climbed to 5.8  The repaired aorta only measured 4.1 cm by non contrast CT in March  Past Medical History:  Diagnosis Date  . 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 Gilford Raid  . ETOH abuse   . 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 Hypertension   . Thyroid nodule     Past Surgical History:  Procedure Laterality Date  . CARDIAC SURGERY     pt unsure of actual Sx - knows it was due to "anurysm"  . THROAT SURGERY       Inpatient Medications: Scheduled Meds: . amLODipine  10 mg Oral Daily  . aspirin EC  81 mg Oral Daily  . carvedilol  12.5 mg Oral BID WC  . feeding supplement (PRO-STAT SUGAR FREE 64)  30 mL Oral BID  . furosemide  40 mg Oral BID  . hydrALAZINE  50 mg Oral Q8H  . kidney failure book   Does not apply Once  . nitroGLYCERIN  1 inch Topical Q6H  . sodium chloride flush  3 mL Intravenous Q12H  . zolpidem  5 mg Oral Once   Continuous Infusions: . ferumoxytol 510 mg (09/28/16 1625)   PRN Meds: acetaminophen **OR** acetaminophen, hydrALAZINE  Allergies:   No Known Allergies  Social History:   Social History   Social History  . Marital status: Married    Spouse name: N/A  . Number of children: N/A  . Years of education: N/A   Occupational History  . Packaging    Social History Main Topics  . Smoking status: Never Smoker  . Smokeless tobacco: Never Used  . Alcohol use 21.0 oz/week    35 Cans of beer per week  . Drug use: No  . Sexual activity: Not on file   Other Topics Concern  . Not on file   Social History Narrative  . No narrative on file    Family History:   The patient's family history includes Diabetes in his mother; Hypertension in his father.  ROS:  Please see the history of present illness.  ROS  All other ROS reviewed and negative.      Physical Exam/Data:   Vitals:   09/29/16 0830 09/29/16 1120 09/29/16 1259 09/29/16 1457  BP: (!) 169/85 (!) 157/96 (!) 152/102 (!) 147/96  Pulse: 86     Resp: 11     Temp:   98.5 F (36.9 C)   TempSrc:   Oral   SpO2: 97%     Weight:      Height:        Intake/Output Summary (Last 24 hours) at 09/29/16 1614 Last data filed at 09/29/16 1121  Gross per 24 hour  Intake             1510 ml  Output             1930 ml  Net             -420 ml   Filed Weights   09/28/16 0245 09/29/16 0620  Weight: 187 lb 9.8 oz (85.1 kg) 192 lb 14.4 oz (87.5 kg)   Body mass index is 27.68 kg/m.  General:  Well nourished, well developed, in no acute distress  HEENT: normal Lymph: no adenopathy Neck: no JVD Endocrine:  No thryomegaly Vascular: No carotid bruits; FA pulses 2+ bilaterally without bruits  Cardiac:  normal S1, S2; RRR; no murmur S4 gallop Post sternotomy  Lungs:  clear to auscultation bilaterally, no wheezing, rhonchi or rales  Abd: soft, nontender, no hepatomegaly  Ext: no edema Musculoskeletal:  No deformities, BUE and BLE strength normal and equal Skin: warm and dry  Neuro:  CNs 2-12 intact, no focal abnormalities noted Psych:  Normal affect   EKG:  The EKG was personally reviewed and demonstrates:  SR LVH no acute ST changes  Telemetry:  Telemetry was personally reviewed and demonstrates:  NSR no arrhythmia   Relevant CV Studies: Echo EF 50-55%   Laboratory Data:  Chemistry  Recent Labs Lab 09/27/16 2115 09/28/16 0431 09/29/16 0537  NA 137 139 138  K 3.1* 2.7* 3.2*  CL 108 110 111  CO2 19* 21* 19*  GLUCOSE 93 154* 106*  BUN 46* 45* 42*  CREATININE 6.04* 5.82* 5.21*  CALCIUM 9.9 9.1 9.2  GFRNONAA 10* 10* 12*  GFRAA 11* 12* 13*  ANIONGAP 10 8 8      Recent Labs Lab 09/27/16 2115 09/28/16 0431 09/29/16 0537  PROT 6.8 5.5*  --   ALBUMIN 2.9* 2.4* 2.3*  AST 40 28  --   ALT 57 43  --   ALKPHOS 117 96  --   BILITOT 0.9 0.5  --     Hematology  Recent Labs Lab 09/27/16 2115 09/28/16 0431 09/29/16 0537  WBC  5.7 6.1 7.0  RBC 4.22 3.46* 3.56*  HGB 12.0* 9.7* 10.1*  HCT 35.8* 29.3* 31.0*  MCV 84.8 84.7 87.1  MCH 28.4 28.0 28.4  MCHC 33.5 33.1 32.6  RDW 14.9 14.9 15.4  PLT 103* 88* 96*   Cardiac Enzymes  Recent Labs Lab 09/28/16 2258 09/29/16 0537 09/29/16 1208  TROPONINI 0.13* 0.14* 0.12*   No results for input(s): TROPIPOC in the last 168 hours.  BNPNo results for input(s): BNP, PROBNP in the last 168 hours.  DDimer No results for input(s): DDIMER in the last 168 hours.  Radiology/Studies:  Dg Chest 2 View  Result Date: 09/27/2016 CLINICAL DATA:  Shortness of breath, cough, sneezing EXAM: CHEST  2 VIEW COMPARISON:  06/19/2016 FINDINGS: Cardiomegaly. Prior median sternotomy. Mild perihilar and lower lobe interstitial prominence may reflect interstitial edema. No significant effusions. No acute bony abnormality. IMPRESSION: Cardiomegaly with vascular congestion and possible early interstitial edema. Electronically Signed   By: Rolm Baptise M.D.   On: 09/27/2016 21:44   Ct Head Wo Contrast  Result Date: 09/27/2016 CLINICAL DATA:  51 y/o  M; hypertension. EXAM: CT HEAD WITHOUT CONTRAST TECHNIQUE: Contiguous axial images were obtained from the base of the skull through the vertex without intravenous contrast. COMPARISON:  None. FINDINGS: Brain: No evidence of acute infarction, hemorrhage, hydrocephalus, extra-axial collection or mass lesion/mass effect. Foci of hypoattenuation are present in subcortical white matter with predominantly bifrontal distribution likely representing mild chronic microvascular ischemic changes. Vascular: No hyperdense vessel or unexpected calcification. Skull: Normal. Negative for fracture or focal lesion. Sinuses/Orbits: No acute finding. Other: None. IMPRESSION: No acute intracranial abnormality. Mild chronic microvascular ischemic changes of the brain. Electronically Signed   By:  Kristine Garbe M.D.   On: 09/27/2016 22:08   US Renal  Result Date: 09/28/2016 CLINICAL DATA:  Initial evaluation for acute on chronic renal failure. EXAM: RENAL / URINARY TRACT ULTRASOUND COMPLETE COMPARISON:  Prior CT from 06/17/2016. FINDINGS: Right Kidney: Length: 11.2 cm. Diffusely increased echogenicity, compatible with medical renal disease. No hydronephrosis. 2 small subcentimeter cysts noted. Left Kidney: Length: 10.5 cm. Diffusely increased echogenicity, compatible with medical renal disease. No hydronephrosis. Two small cysts measuring up to 1 cm noted. Bladder: Appears normal for degree of bladder distention. Prostate enlarged measuring 4.2 x 3.7 x 5.2 cm. IMPRESSION: 1. Diffusely increased echogenicity within the renal parenchyma, consistent with underlying medical renal disease. No hydronephrosis. 2. Enlarged prostate. Electronically Signed   By: Jeannine Boga M.D.   On: 09/28/2016 05:49    Assessment and Plan:   1. Chest Pain Self limited atypical with no ECG changes No history of CAD and echo with no definitive RWMA;s Troponin mild elevation not likely significant in setting of A/CRF.  Would suggest outpatient myovue.  2. Repaired Type A dissection:  Ok by CT in March no change in contour of CXR consider non contrast MRI ( [facility] ) to further evaluate  3. HTN:  Improved on 3 drug Rx continue to titrate meds 4. A/CRF:  Not oliguric may need fistula this admission being seen by [facility] this admission started on lasix and iv iron    Signed, Jenkins Rouge, MD  09/29/2016 4:14 PM

## 2016-09-29 NOTE — Progress Notes (Signed)
CRITICAL VALUE ALERT  Critical Value:  Troponin 0.13  Date & Time Notied:  09/29/16 @ 0011  Provider Notified: Schorr, NP  Orders Received/Actions taken: No orders given at this time. Will continue to monitor.

## 2016-09-29 NOTE — Progress Notes (Signed)
Shift event note: Notified at approx 2120 that pt had an episode of CP at approx 1915. An EKG was obtained at the time of CP that revealed NSR. At the time of provider notification pt was CP free. Troponin added. However, BP's remain significantly elevated (190/113-221/115) despite po med regimen and PRN hydralazine. Labetalol 20 mg IV ordered to be given and RN requested to f/u w/ provider 30 min after IV Labetalol. F/u BP 30 min after IV  Labetalol 166/100. Troponin resulted at 0.13. Saw pt at bedside. Noted resting in bed in NAD. He is alert and oriented x 3. He has remained CP free. When ask pt describes earlier episode of CP as "chest hurting all over" that was non-radiating and associated with chills and feeling very cold. Denied SOB, nausea, dizziness or other symptoms. Reports it lasted approx 30 min. EKG reviewed > NSR w/ rate of 89, (L) atrial enlargement, LVH, non-specific T-wave abnormality and prolonged QTc and quite similar to previous EKG. BBS diminished but otherwise CTA.  Heart sounds w/ RRR and w/o M/G/R. Assessment/Plan: 1. Chest pain: Resolved w/o intervention. Atypical in nature. EKG w/o ischemic changes. Trop elevated so concern for ACS vs ischemic demand given persistent HTN and Acute on Chronic kidney disease. Will continue to cycle troponin's.  2. Hypertensive emergency: Refractory to current po med regimen and PRN IV Hydralazine and IV Labetalol. Will give Clonidine 0.1 po. RN to f/u w/ provider 1 hour after Clonidine. Will consider restarting NTG qtt if BP not improved. Will continue to monitor closely on SDU.   Jeryl Columbia, NP-C [facility] Pager [phone]

## 2016-09-29 NOTE — Progress Notes (Signed)
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 Blood pressure systolic was greater than 200. He was also found to have worsening creatinine. He was started on nitroglycerin infusion and was hospitalized for further management.  Reason for Visit: Hypertensive emergency. Acute on chronic kidney disease  Consultants: Nephrology  Procedures: None  Antibiotics: None  Subjective/Interval History: Patient developed chest pressure last night. His blood pressure was quite elevated at that time. EKG did not show any new changes. Nitroglycerin ointment was applied. Pain started subsiding. Denies any chest pain this morning. Denies shortness of breath.   ROS: Denies any nausea or vomiting  Objective:  Vital Signs  Vitals:   09/29/16 0421 09/29/16 0430 09/29/16 0616 09/29/16 0620  BP:  (!) 140/96 (!) 178/112   Pulse:  86    Resp:  16    Temp: 98.8 F (37.1 C)     TempSrc: Oral     SpO2:  95%    Weight:    87.5 kg (192 lb 14.4 oz)  Height:    5\' 10"  (1.778 m)    Intake/Output Summary (Last 24 hours) at 09/29/16 0753 Last data filed at 09/29/16 0625  Gross per 24 hour  Intake             2547 ml  Output             2480 ml  Net               67 ml   Filed Weights   09/28/16 0245 09/29/16 0620  Weight: 85.1 kg (187 lb 9.8 oz) 87.5 kg (192 lb 14.4 oz)    General appearance: Awake, alert. In no distress Resp: Clear to auscultation bilaterally Cardio: S1, S2 is normal, regular. No S3, S4. No rubs, murmurs, bruit GI: Abdomen is soft. Nondistended. Bowel sounds are present. No masses, organomegaly Extremities: No edema Neurologic: Awake and  alert. Oriented 3. No focal neurological deficits  Lab Results:  Data Reviewed: I have personally reviewed following labs and imaging studies  CBC:  Recent Labs Lab 09/27/16 2115 09/28/16 0431 09/29/16 0537  WBC 5.7 6.1 7.0  NEUTROABS 3.1  --   --   HGB 12.0* 9.7* 10.1*  HCT 35.8* 29.3* 31.0*  MCV 84.8 84.7 87.1  PLT 103* 88* 96*    Basic Metabolic Panel:  Recent Labs Lab 09/27/16 2115 09/28/16 0431 09/29/16 0537  NA 137 139 138  K 3.1* 2.7* 3.2*  CL 108 110 111  CO2 19* 21* 19*  GLUCOSE 93 154* 106*  BUN 46* 45* 42*  CREATININE 6.04* 5.82* 5.21*  CALCIUM 9.9 9.1 9.2  MG  --  2.2  --   PHOS  --  2.9 3.7    GFR: Estimated Creatinine Clearance: 17.1 mL/min (A) (by C-G formula based on SCr of 5.21 mg/dL (H)).  Liver Function Tests:  Recent Labs Lab 09/27/16 2115 09/28/16 0431 09/29/16 0537  AST 40 28  --   ALT 57 43  --   ALKPHOS 117 96  --   BILITOT 0.9 0.5  --   PROT 6.8 5.5*  --   ALBUMIN 2.9* 2.4* 2.3*    Thyroid Function Tests:  Recent Labs  09/28/16 0431  TSH 1.548    Anemia Panel:  Recent Labs  09/28/16 0431  VITAMINB12 775  FERRITIN 295  TIBC 221*  IRON 39*    Recent Results (from the past 240 hour(s))  MRSA PCR Screening     Status: None   Collection Time: 09/28/16  2:39 AM  Result Value Ref Range Status   MRSA by PCR NEGATIVE NEGATIVE Final    Comment:        The GeneXpert MRSA Assay (FDA approved for NASAL specimens only), is one component of a comprehensive MRSA colonization surveillance program. It is not intended to diagnose MRSA infection nor to guide or monitor treatment for MRSA infections.       Radiology Studies: Dg Chest 2 View  Result Date: 09/27/2016 CLINICAL DATA:  Shortness of breath, cough, sneezing EXAM: CHEST  2 VIEW COMPARISON:  06/19/2016 FINDINGS: Cardiomegaly. Prior median sternotomy. Mild perihilar and lower lobe interstitial prominence may reflect interstitial edema. No significant  effusions. No acute bony abnormality. IMPRESSION: Cardiomegaly with vascular congestion and possible early interstitial edema. Electronically Signed   By: Rolm Baptise M.D.   On: 09/27/2016 21:44   Ct Head Wo Contrast  Result Date: 09/27/2016 CLINICAL DATA:  51 y/o  M; hypertension. EXAM: CT HEAD WITHOUT CONTRAST TECHNIQUE: Contiguous axial images were obtained from the base of the skull through the vertex without intravenous contrast. COMPARISON:  None. FINDINGS: Brain: No evidence of acute infarction, hemorrhage, hydrocephalus, extra-axial collection or mass lesion/mass effect. Foci of hypoattenuation are present in subcortical white matter with predominantly bifrontal distribution likely representing mild chronic microvascular ischemic changes. Vascular: No hyperdense vessel or unexpected calcification. Skull: Normal. Negative for fracture or focal lesion. Sinuses/Orbits: No acute finding. Other: None. IMPRESSION: No acute intracranial abnormality. Mild chronic microvascular ischemic changes of the brain. Electronically Signed   By: Kristine Garbe M.D.   On: 09/27/2016 22:08   US Renal  Result Date: 09/28/2016 CLINICAL DATA:  Initial evaluation for acute on chronic renal failure. EXAM: RENAL / URINARY TRACT ULTRASOUND COMPLETE COMPARISON:  Prior CT from 06/17/2016. FINDINGS: Right Kidney: Length: 11.2 cm. Diffusely increased echogenicity, compatible with medical renal disease. No hydronephrosis. 2 small subcentimeter cysts noted. Left Kidney: Length: 10.5 cm. Diffusely increased echogenicity, compatible with medical renal disease. No hydronephrosis. Two small cysts measuring up to 1 cm noted. Bladder: Appears normal for degree of bladder distention. Prostate enlarged measuring 4.2 x 3.7 x 5.2 cm. IMPRESSION: 1. Diffusely increased echogenicity within the renal parenchyma, consistent with underlying medical renal disease. No hydronephrosis. 2. Enlarged prostate. Electronically Signed   By:  Jeannine Boga M.D.   On: 09/28/2016 05:49     Medications:  Scheduled: . amLODipine  10 mg Oral Daily  . carvedilol  12.5 mg Oral BID WC  . feeding supplement (PRO-STAT SUGAR FREE 64)  30 mL Oral BID  . furosemide  40 mg Oral BID  . hydrALAZINE  50 mg Oral Q8H  . kidney failure book   Does not apply Once  . nitroGLYCERIN  1 inch Topical Q6H  . potassium chloride  40 mEq Oral Once  . sodium chloride flush  3 mL Intravenous Q12H  . zolpidem  5 mg Oral Once   Continuous: . ferumoxytol 510 mg (09/28/16 1625)   FWY:OVZCHYIFOYDXA **OR** acetaminophen, hydrALAZINE  Assessment/Plan:  Active Problems:   Hypertensive emergency   Acute kidney injury ([place])   Anemia   Thrombocytopenia ([place])   ARF (acute renal failure) ([place])    Hypertensive emergency. Patient with  significantly elevated blood pressures at the time of admission with worsening renal function. Patient was placed on nitroglycerin infusion. Blood pressures have improved. He was started back on his usual home medication regimen. Her pressures better, but still quite elevated. Increase dose of carvedilol. Diuretics initiated by nephrology. Continue to monitor for now.  Acute on chronic kidney disease, stage IV. Creatinine was around 4.0 few months ago. It was 6 at the time of admission. Patient has seen nephrology about 3-4 years ago. Renal failure is most likely secondary to uncontrolled hypertension. Appreciate nephrology input. Creatinine is slowly improving. Patient will need outpatient follow-up. Renal ultrasound as above. Medical renal disease noted. No hydronephrosis. Prostate enlargement noted. Monitor urine output.   Chest Pain with mildly elevated troponin Etiology for his chest pain is the most likely elevated blood pressure. Troponin, however, noted to be mildly elevated, which could be due to his renal failure. EKG did not show any new changes. Does show LVH with strain pattern. Currently chest pain-free. Most  recent echocardiogram was in March 2018 which showed a normal ejection fraction without any wall motion abnormalities. However, due to his symptoms and mildly elevated troponin we will get cardiology input. Aspirin.  Normocytic anemia. Hemoglobin noted to be lower compared to previous values. He was between 12 and 13 few months ago. It was 12 at the time of admission but dropped to 9.7 7/12. Stable today. Drop in hemoglobin is most likely dilutional. He likely has anemia of chronic disease due to his kidney disease. Iron was 39. Ferritin 295. Feraheme ordered by nephrology.  Thrombocytopenia. Reason for low platelet counts, not entirely clear. No overt bleeding. Counts are improving.   Hypokalemia. Potassium level has improved though remains low. Additional dose ordered today.   Noncompliance Patient has insurance and despite this he has not been compliant with his medications. This was discussed with the patient. He has had some family stressors recently. Apparently there have been some deaths in his family recently. He's also had some financial issues despite having insurance and as a result has not been able to get his medications. We will have case manager discuss this with him.  DVT Prophylaxis: SCDs    Code Status: Full code  Family Communication: Discussed with the patient  Disposition Plan: Management as outlined above. Await cardiology input. Could transition him to floor today.    LOS: 2 days   [facility] Pager [phone] 09/29/2016, 7:53 AM  If 7PM-7AM, please contact night-coverage at [email or web address], password [facility]

## 2016-09-29 NOTE — Care Management Note (Addendum)
Case Management Note  Patient Details  Name: Colin [name] MRN: [id number] Date of Birth: 08/19/1965  Subjective/Objective:   From home with children, pta indep, presents with HTN urgency, AKI, noncompliance,  adjusting bp meds.  Nephrology following, for possible dialysis. He states he has a PCP , Dr. Nena Jordan at [facility], he has medication coverage but has a hard time paying co pay amts, he will need generic medications if possible at discharge.                Action/Plan: NCM will follow for dc needs.   Expected Discharge Date:                  Expected Discharge Plan:  Home/Self Care  In-House Referral:     Discharge planning Services  CM Consult  Post Acute Care Choice:    Choice offered to:     DME Arranged:    DME Agency:     HH Arranged:    HH Agency:     Status of Service:  In process, will continue to follow  If discussed at Long Length of Stay Meetings, dates discussed:    Additional Comments:  Zenon Mayo, RN 09/29/2016, 12:02 PM

## 2016-09-29 NOTE — Progress Notes (Signed)
S: No SOB  No N/V O:BP (!) 178/112   Pulse 86   Temp 98.8 F (37.1 C) (Oral)   Resp 16   Ht 5\' 10"  (1.778 m)   Wt 87.5 kg (192 lb 14.4 oz)   SpO2 95%   BMI 27.68 kg/m   Intake/Output Summary (Last 24 hours) at 09/29/16 0724 Last data filed at 09/29/16 9604  Gross per 24 hour  Intake             2547 ml  Output             2480 ml  Net               67 ml   Weight change: 2.4 kg (5 lb 4.7 oz) VWU:JWJXB and alert CVS:RRR  Resp: Decreased BS bases Abd:+ BS NTND Ext:No edema NEURO:CNI Ox3 no asterixis   . amLODipine  10 mg Oral Daily  . carvedilol  6.25 mg Oral BID WC  . feeding supplement (PRO-STAT SUGAR FREE 64)  30 mL Oral BID  . hydrALAZINE  50 mg Oral Q8H  . kidney failure book   Does not apply Once  . nitroGLYCERIN  1 inch Topical Q6H  . sodium chloride flush  3 mL Intravenous Q12H  . zolpidem  5 mg Oral Once   Dg Chest 2 View  Result Date: 09/27/2016 CLINICAL DATA:  Shortness of breath, cough, sneezing EXAM: CHEST  2 VIEW COMPARISON:  06/19/2016 FINDINGS: Cardiomegaly. Prior median sternotomy. Mild perihilar and lower lobe interstitial prominence may reflect interstitial edema. No significant effusions. No acute bony abnormality. IMPRESSION: Cardiomegaly with vascular congestion and possible early interstitial edema. Electronically Signed   By: Rolm Baptise M.D.   On: 09/27/2016 21:44   Ct Head Wo Contrast  Result Date: 09/27/2016 CLINICAL DATA:  51 y/o  M; hypertension. EXAM: CT HEAD WITHOUT CONTRAST TECHNIQUE: Contiguous axial images were obtained from the base of the skull through the vertex without intravenous contrast. COMPARISON:  None. FINDINGS: Brain: No evidence of acute infarction, hemorrhage, hydrocephalus, extra-axial collection or mass lesion/mass effect. Foci of hypoattenuation are present in subcortical white matter with predominantly bifrontal distribution likely representing mild chronic microvascular ischemic changes. Vascular: No hyperdense vessel or  unexpected calcification. Skull: Normal. Negative for fracture or focal lesion. Sinuses/Orbits: No acute finding. Other: None. IMPRESSION: No acute intracranial abnormality. Mild chronic microvascular ischemic changes of the brain. Electronically Signed   By: Kristine Garbe M.D.   On: 09/27/2016 22:08   US Renal  Result Date: 09/28/2016 CLINICAL DATA:  Initial evaluation for acute on chronic renal failure. EXAM: RENAL / URINARY TRACT ULTRASOUND COMPLETE COMPARISON:  Prior CT from 06/17/2016. FINDINGS: Right Kidney: Length: 11.2 cm. Diffusely increased echogenicity, compatible with medical renal disease. No hydronephrosis. 2 small subcentimeter cysts noted. Left Kidney: Length: 10.5 cm. Diffusely increased echogenicity, compatible with medical renal disease. No hydronephrosis. Two small cysts measuring up to 1 cm noted. Bladder: Appears normal for degree of bladder distention. Prostate enlarged measuring 4.2 x 3.7 x 5.2 cm. IMPRESSION: 1. Diffusely increased echogenicity within the renal parenchyma, consistent with underlying medical renal disease. No hydronephrosis. 2. Enlarged prostate. Electronically Signed   By: Jeannine Boga M.D.   On: 09/28/2016 05:49   BMET    Component Value Date/Time   NA 138 09/29/2016 0537   K 3.2 (L) 09/29/2016 0537   CL 111 09/29/2016 0537   CO2 19 (L) 09/29/2016 0537   GLUCOSE 106 (H) 09/29/2016 0537   BUN 42 (  H) 09/29/2016 0537   CREATININE 5.21 (H) 09/29/2016 0537   CALCIUM 9.2 09/29/2016 0537   GFRNONAA 12 (L) 09/29/2016 0537   GFRAA 13 (L) 09/29/2016 0537   CBC    Component Value Date/Time   WBC 7.0 09/29/2016 0537   RBC 3.56 (L) 09/29/2016 0537   HGB 10.1 (L) 09/29/2016 0537   HCT 31.0 (L) 09/29/2016 0537   PLT 96 (L) 09/29/2016 0537   MCV 87.1 09/29/2016 0537   MCH 28.4 09/29/2016 0537   MCHC 32.6 09/29/2016 0537   RDW 15.4 09/29/2016 0537   LYMPHSABS 1.8 09/27/2016 2115   MONOABS 0.6 09/27/2016 2115   EOSABS 0.2 09/27/2016 2115    BASOSABS 0.0 09/27/2016 2115     Assessment:  1. CKD 4 sec HTN, Scr trending down slightly 2. HTN urgency 3. Anemia, iron def  SP IV iron 4. Hypokalemia 5. Noncompliance  Plan: 1.  Rec increasing carvedilol to at least 12.5mg  BID 2. He will need a diuretic as well  Start with lasix 40mg  BID 3. Daily Scr 4. Replace K 5. Show him Dx videos so he can get an understanding of his future  Nastacia Raybuck T

## 2016-09-29 NOTE — Progress Notes (Signed)
Patient arrived on unit from [place].  No family at bedside.  Telemetry placed per MD order and CMT notified.

## 2016-09-30 DIAGNOSIS — I248 Other forms of acute ischemic heart disease: Secondary | ICD-10-CM

## 2016-09-30 LAB — CBC
HCT: 31.5 % — ABNORMAL LOW (ref 39.0–52.0)
HEMOGLOBIN: 10.1 g/dL — AB (ref 13.0–17.0)
MCH: 27.9 pg (ref 26.0–34.0)
MCHC: 32.1 g/dL (ref 30.0–36.0)
MCV: 87 fL (ref 78.0–100.0)
Platelets: 100 10*3/uL — ABNORMAL LOW (ref 150–400)
RBC: 3.62 MIL/uL — AB (ref 4.22–5.81)
RDW: 15.4 % (ref 11.5–15.5)
WBC: 6.5 10*3/uL (ref 4.0–10.5)

## 2016-09-30 LAB — GLUCOSE, CAPILLARY
GLUCOSE-CAPILLARY: 115 mg/dL — AB (ref 65–99)
GLUCOSE-CAPILLARY: 121 mg/dL — AB (ref 65–99)

## 2016-09-30 LAB — BASIC METABOLIC PANEL
ANION GAP: 10 (ref 5–15)
BUN: 50 mg/dL — ABNORMAL HIGH (ref 6–20)
CHLORIDE: 108 mmol/L (ref 101–111)
CO2: 18 mmol/L — ABNORMAL LOW (ref 22–32)
Calcium: 9.7 mg/dL (ref 8.9–10.3)
Creatinine, Ser: 5.31 mg/dL — ABNORMAL HIGH (ref 0.61–1.24)
GFR calc non Af Amer: 11 mL/min — ABNORMAL LOW (ref >60)
GFR, EST AFRICAN AMERICAN: 13 mL/min — AB (ref >60)
Glucose, Bld: 112 mg/dL — ABNORMAL HIGH (ref 65–99)
POTASSIUM: 3.6 mmol/L (ref 3.5–5.1)
SODIUM: 136 mmol/L (ref 135–145)

## 2016-09-30 MED ORDER — AMLODIPINE BESYLATE 10 MG PO TABS: 10.0000 mg | ORAL_TABLET | Freq: Every day | ORAL | 1 refills | Status: AC

## 2016-09-30 MED ORDER — ASPIRIN 81 MG PO TBEC: 81.0000 mg | DELAYED_RELEASE_TABLET | Freq: Every day | ORAL | 0 refills | Status: AC

## 2016-09-30 MED ORDER — HYDRALAZINE HCL 25 MG PO TABS: 75.0000 mg | ORAL_TABLET | Freq: Three times a day (TID) | ORAL | 1 refills | Status: DC

## 2016-09-30 MED ORDER — CARVEDILOL 12.5 MG PO TABS: 25.0000 mg | ORAL_TABLET | Freq: Two times a day (BID) | ORAL | 2 refills | Status: AC

## 2016-09-30 MED ORDER — FUROSEMIDE 40 MG PO TABS: 40.0000 mg | ORAL_TABLET | Freq: Two times a day (BID) | ORAL | 1 refills | Status: DC

## 2016-09-30 NOTE — Progress Notes (Signed)
Progress Note  Patient Name: Colin [name] Date of Encounter: 09/30/2016  Primary Cardiologist: Johnsie Cancel (new)  Subjective   No CP, no SOB  Inpatient Medications    Scheduled Meds: . amLODipine  10 mg Oral Daily  . aspirin EC  81 mg Oral Daily  . carvedilol  12.5 mg Oral BID WC  . feeding supplement (PRO-STAT SUGAR FREE 64)  30 mL Oral BID  . furosemide  40 mg Oral BID  . hydrALAZINE  50 mg Oral Q8H  . kidney failure book   Does not apply Once  . nitroGLYCERIN  1 inch Topical Q6H  . sodium chloride flush  3 mL Intravenous Q12H  . zolpidem  5 mg Oral Once   Continuous Infusions: . ferumoxytol 510 mg (09/28/16 1625)   PRN Meds: acetaminophen **OR** acetaminophen, hydrALAZINE   Vital Signs    Vitals:   09/29/16 1657 09/29/16 2113 09/30/16 0300 09/30/16 1044  BP: (!) 171/90 (!) 161/101 (!) 194/99 (!) 167/93  Pulse: 90 89 95 91  Resp: 17 17 17    Temp: 98.6 F (37 C) 98.7 F (37.1 C) 98.9 F (37.2 C) 98.8 F (37.1 C)  TempSrc: Oral Oral Oral Oral  SpO2: 98% 97% 96% 98%  Weight:  192 lb 10.9 oz (87.4 kg)    Height:        Intake/Output Summary (Last 24 hours) at 09/30/16 1244 Last data filed at 09/30/16 0900  Gross per 24 hour  Intake              340 ml  Output              700 ml  Net             -360 ml   Filed Weights   09/28/16 0245 09/29/16 0620 09/29/16 2113  Weight: 187 lb 9.8 oz (85.1 kg) 192 lb 14.4 oz (87.5 kg) 192 lb 10.9 oz (87.4 kg)    ECG    NSR with LVH repol abn/TWI - Personally Reviewed  Physical Exam   GEN: No acute distress.   Neck: No JVD Cardiac: RRR, 1/6 SM, no rubs, or gallops.  Respiratory: Clear to auscultation bilaterally. GI: Soft, nontender, non-distended  MS: No edema; No deformity. Neuro:  Nonfocal  Psych: Normal affect   Labs    Chemistry Recent Labs Lab 09/27/16 2115 09/28/16 0431 09/29/16 0537 09/30/16 0558  NA 137 139 138 136  K 3.1* 2.7* 3.2* 3.6  CL 108 110 111 108  CO2 19* 21* 19* 18*    GLUCOSE 93 154* 106* 112*  BUN 46* 45* 42* 50*  CREATININE 6.04* 5.82* 5.21* 5.31*  CALCIUM 9.9 9.1 9.2 9.7  PROT 6.8 5.5*  --   --   ALBUMIN 2.9* 2.4* 2.3*  --   AST 40 28  --   --   ALT 57 43  --   --   ALKPHOS 117 96  --   --   BILITOT 0.9 0.5  --   --   GFRNONAA 10* 10* 12* 11*  GFRAA 11* 12* 13* 13*  ANIONGAP 10 8 8 10      Hematology Recent Labs Lab 09/28/16 0431 09/29/16 0537 09/30/16 0558  WBC 6.1 7.0 6.5  RBC 3.46* 3.56* 3.62*  HGB 9.7* 10.1* 10.1*  HCT 29.3*  29.4* 31.0* 31.5*  MCV 84.7 87.1 87.0  MCH 28.0 28.4 27.9  MCHC 33.1 32.6 32.1  RDW 14.9 15.4 15.4  PLT 88* 96* 100*  Cardiac Enzymes Recent Labs Lab 09/28/16 2258 09/29/16 0537 09/29/16 1208  TROPONINI 0.13* 0.14* 0.12*   No results for input(s): TROPIPOC in the last 168 hours.   BNPNo results for input(s): BNP, PROBNP in the last 168 hours.   DDimer No results for input(s): DDIMER in the last 168 hours.   Radiology    No results found.  Cardiac Studies   ECHO 06/17/16 - Left ventricle: inferobasal hypokinesis The cavity size was   mildly dilated. Wall thickness was increased in a pattern of mild   LVH. Systolic function was normal. The estimated ejection   fraction was in the range of 50% to 55%. Wall motion was normal;   there were no regional wall motion abnormalities. Doppler   parameters are consistent with both elevated ventricular   end-diastolic filling pressure and elevated left atrial filling   pressure. - Mitral valve: There was mild regurgitation. - Left atrium: The atrium was moderately dilated. - Right atrium: The atrium was mildly dilated. - Atrial septum: No defect or patent foramen ovale was identified.  Patient Profile     51 y.o. 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 Setting of renal failure and HTN. Demand ischemia.  - Dr. Johnsie Cancel recommends outpatient stress.   - I will notify scheduler to have this set up.  Essential HTN  - improved but still has room to go. Titrate coreg.   Acute on chronic renal failure  - Dr. Mercy Moore note reviewed. Has outpt appt with Dr. Justin Mend soon.   Will sign off. Please call if ?Marland Kitchen   Signed, Candee Furbish, MD  09/30/2016, 12:44 PM

## 2016-09-30 NOTE — Discharge Instructions (Signed)
Food Basics for Chronic Kidney Disease  When your kidneys are not working well, they cannot remove waste and excess substances from your blood as effectively as they did before. This can lead to a buildup and imbalance of these substances, which can worsen kidney damage and affect how your body functions. Certain foods lead to a buildup of these substances in the body. By changing your diet as recommended by your diet and nutrition specialist (dietitian) or health care provider, you could help prevent further kidney damage and delay or prevent the need for dialysis.  What are tips for following this plan?  General instructions  · Work with your health care provider and dietitian to develop a meal plan that is right for you. Foods you can eat, limit, or avoid will be different for each person depending on the stage of kidney disease and any other existing health conditions.  · Talk with your health care provider about whether you should take a vitamin and mineral supplement.  · Use standard measuring cups and spoons to measure servings of foods. Use a kitchen scale to measure portions of protein foods.  · If directed by your health care provider, avoid drinking too much fluid. Measure and count all liquids, including water, ice, soups, flavored gelatin, and frozen desserts such as popsicles or ice cream.  Reading food labels  · Check the amount of sodium in foods. Choose foods that have less than 300 milligrams (mg) per serving.  · Check the ingredient list for phosphorus or potassium-based additives or preservatives.  · Check the amount of saturated and trans fat. Limit or avoid these fats as told by your dietitian.  Shopping  · Avoid buying foods that are:  ? Processed, frozen, or prepackaged.  ? Calcium-enriched or fortified.  · Do not buy foods that have salt or sodium listed among the first five ingredients.  · Do not buy canned vegetables.  Cooking   · Replace animal proteins, such as meat, fish, eggs, or dairy, with plant proteins from beans, nuts, and soy.  ? Use soy milk instead of cow's milk.  ? Add beans or tofu to soups, casseroles, or pasta dishes instead of meat.  · Soak vegetables, such as potatoes, before cooking to reduce potassium. To do this:  ? Peel and cut into small pieces.  ? Soak in warm water for at least 2 hours. For every 1 cup of vegetables, use 10 cups of water.  ? Drain and rinse with warm water.  ? Boil for at least 5 minutes.  Meal planning  · Limit the amount of protein from plant and animal sources you eat each day.  · Do not add salt to food when cooking or before eating.  · Eat meals and snacks at around the same time each day.  If you have diabetes:  · If you have diabetes (diabetes mellitus) and chronic kidney disease, it is important to keep your blood glucose in the target range recommended by your health care provider. Follow your diabetes management plan. This may include:  ? Checking your blood glucose regularly.  ? Taking oral medicines, insulin, or both.  ? Exercising for at least 30 minutes on 5 or more days each week, or as told by your health care provider.  ? Tracking how many servings of carbohydrates you eat at each meal.  · You may be given specific guidelines on how much of certain foods and nutrients you may eat, depending on your stage of   kidney disease and whether you have high blood pressure (hypertension). Follow your meal plan as told by your dietitian.  What nutrients should be limited?  The items listed are not a complete list. Talk with your dietitian about what dietary choices are best for you.  Potassium  Potassium affects how steadily your heart beats. If too much potassium builds up in your blood, it can cause an irregular heartbeat or even a heart attack.  You may need to eat less potassium, depending on your blood potassium levels and the stage of kidney disease. Talk to your dietitian about how  much potassium you may have each day.  You may need to limit or avoid foods that are high in potassium, such as:  · Milk and soy milk.  · Fruits, such as bananas, papaya, apricots, nectarines, melon, prunes, raisins, kiwi, and oranges.  · Vegetables, such as potatoes, sweet potatoes, yams, tomatoes, leafy greens, beets, okra, avocado, pumpkin, and winter squash.  · White and lima beans.    Phosphorus  Phosphorus is a mineral found in your bones. A balance between calcium and phosphorous is needed to build and maintain healthy bones. Too much phosphorus pulls calcium from your bones. This can make your bones weak and more likely to break. Too much phosphorus can also make your skin itch.  You may need to eat less phosphorus depending on your blood phosphorus levels and the stage of kidney disease. Talk to your dietitian about how much potassium you may have each day. You may need to take medicine to lower your blood phosphorus levels if diet changes do not help.  You may need to limit or avoid foods that are high in phosphorus, such as:  · Milk and dairy products.  · Dried beans and peas.  · Tofu, soy milk, and other soy-based meat replacements.  · Colas.  · Nuts and peanut butter.  · Meat, poultry, and fish.  · Bran cereals and oatmeals.    Protein  Protein helps you to make and keep muscle. It also helps in the repair of your body’s cells and tissues. One of the natural breakdown products of protein is a waste product called urea. When your kidneys are not working properly, they cannot remove wastes, such as urea, like they did before you developed chronic kidney disease. Reducing how much protein you eat can help prevent a buildup of urea in your blood.  Depending on your stage of kidney disease, you may need to limit foods that are high in protein. Sources of animal protein include:  · Meat (all types).  · Fish and seafood.  · Poultry.  · Eggs.  · Dairy.    Other protein foods include:  · Beans and legumes.   · Nuts and nut butter.  · Soy and tofu.    Sodium  Sodium, which is found in salt, helps maintain a healthy balance of fluids in your body. Too much sodium can increase your blood pressure and have a negative effect on the function of your heart and lungs. Too much sodium can also cause your body to retain too much fluid, making your kidneys work harder.  Most people should have less than 2,300 milligrams (mg) of sodium each day. If you have hypertension, you may need to limit your sodium to 1,500 mg each day. Talk to your dietitian about how much sodium you may have each day.  You may need to limit or avoid foods that are high in sodium, such   these substances. By adjusting your intake of these foods, you could help prevent more kidney damage and delay or prevent the need for dialysis.  Food adjustments are different for each person with chronic kidney disease. Work with a dietitian to set up nutrient goals and a meal plan that is right for you.  If you have diabetes and chronic kidney disease, it is important to keep your blood glucose in the target range recommended by your health care provider. This information is not intended to replace advice given to you by your health care provider. Make sure you discuss any questions you have with your health care provider. Document Released: 05/27/2002 Document Revised: 03/01/2016 Document Reviewed: 03/01/2016 Elsevier Interactive Patient Education  2017 [employer]   Chronic Kidney Disease, Adult Chronic kidney disease (CKD) happens when the kidneys are damaged during a time of 3 or  more months. The kidneys are two organs that do many important jobs in the body. These jobs include:  Removing wastes and extra fluids from the blood.  Making hormones that maintain the amount of fluid in your tissues and blood vessels.  Making sure that the body has the right amount of fluids and chemicals.  Most of the time, this condition does not go away, but it can usually be controlled. Steps must be taken to slow down the kidney damage or stop it from getting worse. Otherwise, the kidneys may stop working. Follow these instructions at home:  Follow your diet as told by your doctor. You may need to avoid alcohol, salty foods (sodium), and foods that are high in potassium, calcium, and protein.  Take over-the-counter and prescription medicines only as told by your doctor. Do not take any new medicines unless your doctor says you can do that. These include vitamins and minerals. ? Medicines and nutritional supplements can make kidney damage worse. ? Your doctor may need to change how much medicine you take.  Do not use any tobacco products. These include cigarettes, chewing tobacco, and e-cigarettes. If you need help quitting, ask your doctor.  Keep all follow-up visits as told by your doctor. This is important.  Check your blood pressure. Tell your doctor if there are changes to your blood pressure.  Get to a healthy weight. Stay at that weight. If you need help with this, ask your doctor.  Start or continue an exercise plan. Try to exercise at least 30 minutes a day, 5 days a week.  Stay up-to-date with your shots (immunizations) as told by your doctor. Contact a doctor if:  Your symptoms get worse.  You have new symptoms. Get help right away if:  You have symptoms of end-stage kidney disease. These include: ? Headaches. ? Skin that is darker or lighter than normal. ? Numbness in your hands or feet. ? Easy bruising. ? Having hiccups often. ? Chest pain. ? Shortness of  breath. ? Stopping of menstrual periods in women.  You have a fever.  You are making very little pee (urine).  You have pain or bleeding when you pee (urinate). This information is not intended to replace advice given to you by your health care provider. Make sure you discuss any questions you have with your health care provider. Document Released: 05/31/2009 Document Revised: 08/12/2015 Document Reviewed: 11/03/2011 Elsevier Interactive Patient Education  2017 [place].   Managing Your Hypertension Hypertension is commonly called high blood pressure. This is when the force of your blood pressing against the walls of your arteries is too  strong. Arteries are blood vessels that carry blood from your heart throughout your body. Hypertension forces the heart to work harder to pump blood, and may cause the arteries to become narrow or stiff. Having untreated or uncontrolled hypertension can cause heart attack, stroke, kidney disease, and other problems. What are blood pressure readings? A blood pressure reading consists of a higher number over a lower number. Ideally, your blood pressure should be below 120/80. The first ("top") number is called the systolic pressure. It is a measure of the pressure in your arteries as your heart beats. The second ("bottom") number is called the diastolic pressure. It is a measure of the pressure in your arteries as the heart relaxes. What does my blood pressure reading mean? Blood pressure is classified into four stages. Based on your blood pressure reading, your health care provider may use the following stages to determine what type of treatment you need, if any. Systolic pressure and diastolic pressure are measured in a unit called mm Hg. Normal  Systolic pressure: below 240.  Diastolic pressure: below 80. Elevated  Systolic pressure: 973-532.  Diastolic pressure: below 80. Hypertension stage 1  Systolic pressure: 992-426.  Diastolic pressure:  83-41. Hypertension stage 2  Systolic pressure: 962 or above.  Diastolic pressure: 90 or above. What health risks are associated with hypertension? Managing your hypertension is an important responsibility. Uncontrolled hypertension can lead to:  A heart attack.  A stroke.  A weakened blood vessel (aneurysm).  Heart failure.  Kidney damage.  Eye damage.  Metabolic syndrome.  Memory and concentration problems.  What changes can I make to manage my hypertension? Hypertension can be managed by making lifestyle changes and possibly by taking medicines. Your health care provider will help you make a plan to bring your blood pressure within a normal range. Eating and drinking  Eat a diet that is high in fiber and potassium, and low in salt (sodium), added sugar, and fat. An example eating plan is called the DASH (Dietary Approaches to Stop Hypertension) diet. To eat this way: ? Eat plenty of fresh fruits and vegetables. Try to fill half of your plate at each meal with fruits and vegetables. ? Eat whole grains, such as whole wheat pasta, brown rice, or whole grain bread. Fill about one quarter of your plate with whole grains. ? Eat low-fat diary products. ? Avoid fatty cuts of meat, processed or cured meats, and poultry with skin. Fill about one quarter of your plate with lean proteins such as fish, chicken without skin, beans, eggs, and tofu. ? Avoid premade and processed foods. These tend to be higher in sodium, added sugar, and fat.  Reduce your daily sodium intake. Most people with hypertension should eat less than 1,500 mg of sodium a day.  Limit alcohol intake to no more than 1 drink a day for nonpregnant women and 2 drinks a day for men. One drink equals 12 oz of beer, 5 oz of wine, or 1 oz of hard liquor. Lifestyle  Work with your health care provider to maintain a healthy body weight, or to lose weight. Ask what an ideal weight is for you.  Get at least 30 minutes of  exercise that causes your heart to beat faster (aerobic exercise) most days of the week. Activities may include walking, swimming, or biking.  Include exercise to strengthen your muscles (resistance exercise), such as weight lifting, as part of your weekly exercise routine. Try to do these types of exercises for 30  minutes at least 3 days a week.  Do not use any products that contain nicotine or tobacco, such as cigarettes and e-cigarettes. If you need help quitting, ask your health care provider.  Control any long-term (chronic) conditions you have, such as high cholesterol or diabetes. Monitoring  Monitor your blood pressure at home as told by your health care provider. Your personal target blood pressure may vary depending on your medical conditions, your age, and other factors.  Have your blood pressure checked regularly, as often as told by your health care provider. Working with your health care provider  Review all the medicines you take with your health care provider because there may be side effects or interactions.  Talk with your health care provider about your diet, exercise habits, and other lifestyle factors that may be contributing to hypertension.  Visit your health care provider regularly. Your health care provider can help you create and adjust your plan for managing hypertension. Will I need medicine to control my blood pressure? Your health care provider may prescribe medicine if lifestyle changes are not enough to get your blood pressure under control, and if:  Your systolic blood pressure is 130 or higher.  Your diastolic blood pressure is 80 or higher.  Take medicines only as told by your health care provider. Follow the directions carefully. Blood pressure medicines must be taken as prescribed. The medicine does not work as well when you skip doses. Skipping doses also puts you at risk for problems. Contact a health care provider if:  You think you are having a  reaction to medicines you have taken.  You have repeated (recurrent) headaches.  You feel dizzy.  You have swelling in your ankles.  You have trouble with your vision. Get help right away if:  You develop a severe headache or confusion.  You have unusual weakness or numbness, or you feel faint.  You have severe pain in your chest or abdomen.  You vomit repeatedly.  You have trouble breathing. Summary  Hypertension is when the force of blood pumping through your arteries is too strong. If this condition is not controlled, it may put you at risk for serious complications.  Your personal target blood pressure may vary depending on your medical conditions, your age, and other factors. For most people, a normal blood pressure is less than 120/80.  Hypertension is managed by lifestyle changes, medicines, or both. Lifestyle changes include weight loss, eating a healthy, low-sodium diet, exercising more, and limiting alcohol. This information is not intended to replace advice given to you by your health care provider. Make sure you discuss any questions you have with your health care provider. Document Released: 11/29/2011 Document Revised: 02/02/2016 Document Reviewed: 02/02/2016 Elsevier Interactive Patient Education  [employer].

## 2016-09-30 NOTE — Progress Notes (Signed)
S: No SOB  No N/V O:BP (!) 194/99 (BP Location: Right Arm) Comment: Cheryl notified  Pulse 95   Temp 98.9 F (37.2 C) (Oral)   Resp 17   Ht 5\' 10"  (1.778 m)   Wt 87.4 kg (192 lb 10.9 oz)   SpO2 96%   BMI 27.65 kg/m   Intake/Output Summary (Last 24 hours) at 09/30/16 0808 Last data filed at 09/30/16 0644  Gross per 24 hour  Intake              343 ml  Output              700 ml  Net             -357 ml   Weight change: -0.1 kg (-3.5 oz) GBM:SXJDB and alert [facility]:RRR  Resp: Clear Abd:+ BS NTND Ext:No edema NEURO:CNI Ox3 no asterixis   . amLODipine  10 mg Oral Daily  . aspirin EC  81 mg Oral Daily  . carvedilol  12.5 mg Oral BID WC  . feeding supplement (PRO-STAT SUGAR FREE 64)  30 mL Oral BID  . furosemide  40 mg Oral BID  . hydrALAZINE  50 mg Oral Q8H  . kidney failure book   Does not apply Once  . nitroGLYCERIN  1 inch Topical Q6H  . sodium chloride flush  3 mL Intravenous Q12H  . zolpidem  5 mg Oral Once   No results found. BMET    Component Value Date/Time   NA 136 09/30/2016 0558   K 3.6 09/30/2016 0558   CL 108 09/30/2016 0558   CO2 18 (L) 09/30/2016 0558   GLUCOSE 112 (H) 09/30/2016 0558   BUN 50 (H) 09/30/2016 0558   CREATININE 5.31 (H) 09/30/2016 0558   CALCIUM 9.7 09/30/2016 0558   GFRNONAA 11 (L) 09/30/2016 0558   GFRAA 13 (L) 09/30/2016 0558   CBC    Component Value Date/Time   WBC 6.5 09/30/2016 0558   RBC 3.62 (L) 09/30/2016 0558   HGB 10.1 (L) 09/30/2016 0558   HCT 31.5 (L) 09/30/2016 0558   HCT 29.4 (L) 09/28/2016 0431   PLT 100 (L) 09/30/2016 0558   MCV 87.0 09/30/2016 0558   MCH 27.9 09/30/2016 0558   MCHC 32.1 09/30/2016 0558   RDW 15.4 09/30/2016 0558   LYMPHSABS 1.8 09/27/2016 2115   MONOABS 0.6 09/27/2016 2115   EOSABS 0.2 09/27/2016 2115   BASOSABS 0.0 09/27/2016 2115     Assessment:  1. CKD 4 sec HTN.  UO incomplete 2. HTN urgency 3. Anemia, iron def  SP IV iron 4. Hypokalemia 5. Noncompliance  Plan: 1.  He has  outpt appt with Dr Justin Mend 7/31/at 9:15AM 2.  Would like to see his BP a little better before DC 3. Recheck renal fx in AM  Annsleigh Dragoo T

## 2016-09-30 NOTE — Progress Notes (Signed)
Discharge instructions and medications discussed with patient.  All questions answered.  

## 2016-09-30 NOTE — Discharge Summary (Addendum)
[facility]  Physician Discharge Summary   Patient ID: Colin [name] MRN: [id number] DOB/AGE: 1966-02-19 51 y.o.  Admit date: 09/27/2016 Discharge date: 09/30/2016  PCP: Wenda Low, MD  DISCHARGE DIAGNOSES:  Active Problems:   Hypertensive emergency   Acute kidney injury ([place])   Anemia   Thrombocytopenia ([place])   ARF (acute renal failure) ([facility])   RECOMMENDATIONS FOR OUTPATIENT FOLLOW UP: 1. Cardiology will schedule outpatient follow-up for Myoview and MRI to evaluate aneurysm 2. Patient has been made an appointment to see nephrology.  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 Blood pressure systolic was greater than 200. He was also found to have worsening creatinine. He was started on nitroglycerin infusion and was hospitalized for further management.  Consultations:  Nephrology  Cardiology  Procedures: None   HOSPITAL COURSE:   Hypertensive emergency. Patient with significantly elevated blood pressures at the time of admission with worsening renal function. Patient was placed on nitroglycerin infusion. Blood pressures have improved. He was started back on his usual home medication regimen. Patient seen by nephrology. Patient's blood pressure medications were titrated. Blood pressure has improved. It was quite elevated this morning, but after he was given his antihypertensives it has improved. Patient will follow-up with nephrology and cardiology as outpatient. NOTE: His new PCP had recently sent over prescriptions for atenolol, losartan and  clonidine but patient had not taken them yet as he got admitted to the hospital. These medications were replaced with those started during this admission.  Acute on chronic kidney disease, stage IV Creatinine was around 4.0 few months ago. It was 6 at the time of admission. Patient has seen nephrology about 3-4 years ago. Renal failure is most likely secondary to uncontrolled hypertension. Nephrology was consulted. Patient's blood pressure medications were adjusted. He was also started on diuretics. Renal ultrasound was done which showed medical renal disease. No hydronephrosis was noted. Outpatient follow-up with nephrology. If renal function continues to get worse, he will need dialysis in the future.  Chest Pain with mildly elevated troponin Etiology for his chest pain is the most likely elevated blood pressure. Troponin, however, noted to be mildly elevated, which could be due to his renal failure. EKG did not show any new changes. Does show LVH with strain pattern. Currently chest pain-free. Most recent echocardiogram was in March 2018 which showed a normal ejection fraction without any wall motion abnormalities. However, due to his symptoms and mildly elevated troponin cardiology was consulted. They also feel that this does not represent true acute coronary syndrome. They will arrange for outpatient stress test. 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 MRI of the chest to be pursued as an outpatient by cardiology.  Normocytic anemia. Hemoglobin noted to be lower compared to previous values. He was between 12 and 13 few months ago. It was 12 at the time of admission but dropped to 9.7 7/12. Drop in hemoglobin is most likely dilutional. He likely has anemia of chronic disease due to his kidney disease. Iron was 39. Ferritin 295. Feraheme ordered by nephrology.  Thrombocytopenia. Reason  for low platelet counts, not entirely clear. No overt bleeding. Counts are  stable.   Hypokalemia. Potassium level has improved.  Noncompliance Patient has insurance and despite this he has not been compliant with his medications. This was discussed with the patient. He has had some family stressors recently. Apparently there have been some deaths in his family recently. He's also had some financial issues despite having insurance and as a result has not been able to get his medications. He has been given prescriptions for generic medications. Patient has to be more compliant with his medications.  Patient feels well this morning. He denies any chest pain, shortness of breath. He has been ambulating without any difficulties. He wants to go home to take care of his children. Since blood pressure has improved this may be reasonable to do. Discussed with Dr. Mercy Moore with nephrology. He was also okay with patient going home if blood pressure improves. Okay for discharge.    PERTINENT LABS:  The results of significant diagnostics from this hospitalization (including imaging, microbiology, ancillary and laboratory) are listed below for reference.    Microbiology: Recent Results (from the past 240 hour(s))  MRSA PCR Screening     Status: None   Collection Time: 09/28/16  2:39 AM  Result Value Ref Range Status   MRSA by PCR NEGATIVE NEGATIVE Final    Comment:        The GeneXpert MRSA Assay (FDA approved for NASAL specimens only), is one component of a comprehensive MRSA colonization surveillance program. It is not intended to diagnose MRSA infection nor to guide or monitor treatment for MRSA infections.      Labs: Basic Metabolic Panel:  Recent Labs Lab 09/27/16 2115 09/28/16 0431 09/29/16 0537 09/30/16 0558  NA 137 139 138 136  K 3.1* 2.7* 3.2* 3.6  CL 108 110 111 108  CO2 19* 21* 19* 18*  GLUCOSE 93 154* 106* 112*  BUN 46* 45* 42* 50*  CREATININE 6.04* 5.82* 5.21* 5.31*  CALCIUM 9.9 9.1 9.2 9.7  MG  --  2.2  --   --   PHOS  --  2.9 3.7  --     Liver Function Tests:  Recent Labs Lab 09/27/16 2115 09/28/16 0431 09/29/16 0537  AST 40 28  --   ALT 57 43  --   ALKPHOS 117 96  --   BILITOT 0.9 0.5  --   PROT 6.8 5.5*  --   ALBUMIN 2.9* 2.4* 2.3*    CBC:  Recent Labs Lab 09/27/16 2115 09/28/16 0431 09/29/16 0537 09/30/16 0558  WBC 5.7 6.1 7.0 6.5  NEUTROABS 3.1  --   --   --   HGB 12.0* 9.7* 10.1* 10.1*  HCT 35.8* 29.3*  29.4* 31.0* 31.5*  MCV 84.8 84.7 87.1 87.0  PLT 103* 88* 96* 100*   Cardiac Enzymes:  Recent Labs Lab 09/28/16 2258 09/29/16 0537 09/29/16 1208  TROPONINI 0.13* 0.14* 0.12*   BNP: BNP (last 3 results)  Recent Labs  06/17/16 1031  BNP 1,743.8*    CBG:  Recent Labs Lab 09/30/16 0729 09/30/16 1155  GLUCAP 115* 121*     IMAGING STUDIES Dg Chest 2 View  Result Date: 09/27/2016 CLINICAL DATA:  Shortness of breath, cough, sneezing EXAM: CHEST  2 VIEW COMPARISON:  06/19/2016 FINDINGS: Cardiomegaly. Prior median sternotomy. Mild perihilar and lower lobe interstitial prominence may reflect interstitial edema. No significant effusions. No acute bony abnormality. IMPRESSION: Cardiomegaly with vascular congestion and possible early interstitial edema. Electronically  Signed   By: Rolm Baptise M.D.   On: 09/27/2016 21:44   Ct Head Wo Contrast  Result Date: 09/27/2016 CLINICAL DATA:  51 y/o  M; hypertension. EXAM: CT HEAD WITHOUT CONTRAST TECHNIQUE: Contiguous axial images were obtained from the base of the skull through the vertex without intravenous contrast. COMPARISON:  None. FINDINGS: Brain: No evidence of acute infarction, hemorrhage, hydrocephalus, extra-axial collection or mass lesion/mass effect. Foci of hypoattenuation are present in subcortical white matter with predominantly bifrontal distribution likely representing mild chronic microvascular ischemic changes. Vascular: No hyperdense vessel or unexpected calcification. Skull: Normal. Negative for fracture or focal lesion.  Sinuses/Orbits: No acute finding. Other: None. IMPRESSION: No acute intracranial abnormality. Mild chronic microvascular ischemic changes of the brain. Electronically Signed   By: Kristine Garbe M.D.   On: 09/27/2016 22:08   US Renal  Result Date: 09/28/2016 CLINICAL DATA:  Initial evaluation for acute on chronic renal failure. EXAM: RENAL / URINARY TRACT ULTRASOUND COMPLETE COMPARISON:  Prior CT from 06/17/2016. FINDINGS: Right Kidney: Length: 11.2 cm. Diffusely increased echogenicity, compatible with medical renal disease. No hydronephrosis. 2 small subcentimeter cysts noted. Left Kidney: Length: 10.5 cm. Diffusely increased echogenicity, compatible with medical renal disease. No hydronephrosis. Two small cysts measuring up to 1 cm noted. Bladder: Appears normal for degree of bladder distention. Prostate enlarged measuring 4.2 x 3.7 x 5.2 cm. IMPRESSION: 1. Diffusely increased echogenicity within the renal parenchyma, consistent with underlying medical renal disease. No hydronephrosis. 2. Enlarged prostate. Electronically Signed   By: Jeannine Boga M.D.   On: 09/28/2016 05:49    DISCHARGE EXAMINATION: Vitals:   09/29/16 2113 09/30/16 0300 09/30/16 1044 09/30/16 1431  BP: (!) 161/101 (!) 194/99 (!) 167/93 (!) 165/88  Pulse: 89 95 91 88  Resp: 17 17    Temp: 98.7 F (37.1 C) 98.9 F (37.2 C) 98.8 F (37.1 C)   TempSrc: Oral Oral Oral   SpO2: 97% 96% 98%   Weight: 87.4 kg (192 lb 10.9 oz)     Height:       General appearance: alert, cooperative, appears stated age and no distress Resp: clear to auscultation bilaterally Cardio: regular rate and rhythm, S1, S2 normal, no murmur, click, rub or gallop GI: soft, non-tender; bowel sounds normal; no masses,  no organomegaly Extremities: extremities normal, atraumatic, no cyanosis or edema  DISPOSITION: Home  Discharge Instructions    Call MD for:  difficulty breathing, headache or visual disturbances    Complete by:  As  directed    Call MD for:  extreme fatigue    Complete by:  As directed    Call MD for:  persistant dizziness or light-headedness    Complete by:  As directed    Call MD for:  persistant nausea and vomiting    Complete by:  As directed    Call MD for:  severe uncontrolled pain    Complete by:  As directed    Call MD for:  temperature >100.4    Complete by:  As directed    Discharge instructions    Complete by:  As directed    Please be sure to follow-up with the nephrology as well as you primary care physician. Please take your medications as prescribed. Seek attention immediately if you develope chest pain, shortness of breath, severe headaches, nausea, vomiting, dizziness or lightheadedness.  You were cared for by a hospitalist during your hospital stay. If you have any questions about your discharge medications or the care you received  while you were in the hospital after you are discharged, you can call the unit and asked to speak with the hospitalist on call if the hospitalist that took care of you is not available. Once you are discharged, your primary care physician will handle any further medical issues. Please note that NO REFILLS for any discharge medications will be authorized once you are discharged, as it is imperative that you return to your primary care physician (or establish a relationship with a primary care physician if you do not have one) for your aftercare needs so that they can reassess your need for medications and monitor your lab values. If you do not have a primary care physician, you can call [phone] for a physician referral.   Increase activity slowly    Complete by:  As directed       ALLERGIES: No Known Allergies   Discharge Medication List as of 09/30/2016  3:11 PM    START taking these medications   Details  aspirin EC 81 MG EC tablet Take 1 tablet (81 mg total) by mouth daily., Starting Sun 10/01/2016, Normal    furosemide (LASIX) 40 MG tablet Take 1  tablet (40 mg total) by mouth 2 (two) times daily., Starting Sat 09/30/2016, Normal      CONTINUE these medications which have CHANGED   Details  amLODipine (NORVASC) 10 MG tablet Take 1 tablet (10 mg total) by mouth daily., Starting Sat 09/30/2016, Normal    carvedilol (COREG) 12.5 MG tablet Take 2 tablets (25 mg total) by mouth 2 (two) times daily with a meal., Starting Sat 09/30/2016, Normal    hydrALAZINE (APRESOLINE) 25 MG tablet Take 3 tablets (75 mg total) by mouth every 8 (eight) hours., Starting Sat 09/30/2016, Normal      CONTINUE these medications which have NOT CHANGED   Details  Blood Pressure Monitoring (BLOOD PRESSURE CUFF) MISC Use blood pressure cuff to measure your blood pressure at the same time each day. Record each blood pressure and bring to your primary care physician., Print    simvastatin (ZOCOR) 20 MG tablet Take 20 mg by mouth daily., Historical Med      STOP taking these medications     atenolol (TENORMIN) 50 MG tablet      cloNIDine (CATAPRES) 0.3 MG tablet      losartan (COZAAR) 100 MG tablet          Follow-up Information    Edrick Oh, MD Follow up.   Specialty:  Nephrology Why:  You have been made an appointment to see Dr. Justin Mend on July 31 at 9:15 AM Contact information: [place] [id number] [phone]        Wenda Low, MD. Schedule an appointment as soon as possible for a visit in 1 week(s).   Specialty:  Internal Medicine Contact information: [address] [place] [address] [phone]           TOTAL DISCHARGE TIME: 35 mins  [facility] Pager [phone]  09/30/2016, 3:49 PM

## 2016-10-03 ENCOUNTER — Telehealth: Payer: Self-pay

## 2016-10-03 DIAGNOSIS — R079 Chest pain, unspecified: Secondary | ICD-10-CM

## 2016-10-03 NOTE — Telephone Encounter (Signed)
-----   Message from Alben Spittle sent at 10/03/2016  3:15 PM EDT ----- Regarding: FW: NUC stress test - dx chest pain   ----- Message ----- From: Jerline Pain, MD Sent: 09/30/2016  12:50 PM To: [facility] Scheduling Subject: NUC stress test - dx chest pain                Please order NUC stress under Dr. Johnsie Cancel. Dx. CP. Should be DC from hospital soon.   Thanks.   Candee Furbish, MD

## 2016-10-03 NOTE — Telephone Encounter (Signed)
Order has been placed will send message to scheduling.

## 2016-10-04 ENCOUNTER — Telehealth (HOSPITAL_COMMUNITY): Payer: Self-pay | Admitting: Cardiovascular Disease

## 2016-10-05 NOTE — Telephone Encounter (Signed)
User: Colin [name] A Date/time: 10/04/16 1:42 PM  Comment: Called pt and lmsg for him to CB to schedule myoview.   Context:  Outcome: Left Message  Phone number: [phone] Phone Type: Home Phone  Comm. type: Telephone Call type: Outgoing  Contact: Andrews-Commey, Lutricia Feil Relation to patient: Self

## 2016-10-08 LAB — ALDOSTERONE + RENIN ACTIVITY W/ RATIO
ALDO / PRA RATIO: 35.1 — AB (ref 0.0–30.0)
ALDOSTERONE: 8.7 ng/dL (ref 0.0–30.0)
PRA LC/MS/MS: 0.248 ng/mL/h (ref 0.167–5.380)

## 2016-10-09 NOTE — Telephone Encounter (Signed)
Patient stated he just woke up, so I offered to call patient back at around 11:00 to give instructions for myoview stress test. Patient agreed to time.

## 2016-10-10 ENCOUNTER — Telehealth (HOSPITAL_COMMUNITY): Payer: Self-pay | Admitting: *Deleted

## 2016-10-10 NOTE — Telephone Encounter (Signed)
Left message for patient to call back  

## 2016-10-10 NOTE — Telephone Encounter (Signed)
Left message on voicemail in reference to upcoming appointment scheduled for 10/12/16. Phone number given for a call back so details instructions can be given. Colin [name]

## 2016-10-12 ENCOUNTER — Encounter (HOSPITAL_COMMUNITY): Payer: Managed Care, Other (non HMO)

## 2016-10-12 NOTE — Telephone Encounter (Signed)
Patient spoke with Colin [name] from [facility] on 10/10/16, and patient is having his stress test this morning.

## 2016-10-17 ENCOUNTER — Telehealth (HOSPITAL_COMMUNITY): Payer: Self-pay | Admitting: *Deleted

## 2016-10-17 NOTE — Telephone Encounter (Signed)
Left message on voicemail in reference to upcoming appointment scheduled for 10/20/16. Phone number given for a call back so details instructions can be given.  Colin [name]

## 2016-10-20 ENCOUNTER — Encounter (HOSPITAL_COMMUNITY): Payer: Managed Care, Other (non HMO)

## 2017-05-21 ENCOUNTER — Other Ambulatory Visit: Payer: Self-pay

## 2017-05-21 ENCOUNTER — Encounter (HOSPITAL_COMMUNITY): Payer: Self-pay

## 2017-05-21 ENCOUNTER — Emergency Department (HOSPITAL_COMMUNITY): Payer: Managed Care, Other (non HMO)

## 2017-05-21 ENCOUNTER — Inpatient Hospital Stay (HOSPITAL_COMMUNITY): Payer: Managed Care, Other (non HMO)

## 2017-05-21 ENCOUNTER — Inpatient Hospital Stay (HOSPITAL_COMMUNITY)
Admission: EM | Admit: 2017-05-21 | Discharge: 2017-05-25 | DRG: 673 | Disposition: A | Discharge status: Home / Self Care | Payer: Managed Care, Other (non HMO) | Attending: Internal Medicine | Admitting: Internal Medicine

## 2017-05-21 DIAGNOSIS — Z7982 Long term (current) use of aspirin: Secondary | ICD-10-CM | POA: Diagnosis not present

## 2017-05-21 DIAGNOSIS — Z833 Family history of diabetes mellitus: Secondary | ICD-10-CM | POA: Diagnosis not present

## 2017-05-21 DIAGNOSIS — D696 Thrombocytopenia, unspecified: Secondary | ICD-10-CM | POA: Diagnosis present

## 2017-05-21 DIAGNOSIS — Z992 Dependence on renal dialysis: Secondary | ICD-10-CM | POA: Diagnosis not present

## 2017-05-21 DIAGNOSIS — I120 Hypertensive chronic kidney disease with stage 5 chronic kidney disease or end stage renal disease: Secondary | ICD-10-CM | POA: Diagnosis present

## 2017-05-21 DIAGNOSIS — D649 Anemia, unspecified: Secondary | ICD-10-CM

## 2017-05-21 DIAGNOSIS — Z8249 Family history of ischemic heart disease and other diseases of the circulatory system: Secondary | ICD-10-CM | POA: Diagnosis not present

## 2017-05-21 DIAGNOSIS — E785 Hyperlipidemia, unspecified: Secondary | ICD-10-CM | POA: Diagnosis present

## 2017-05-21 DIAGNOSIS — Z9119 Patient's noncompliance with other medical treatment and regimen: Secondary | ICD-10-CM

## 2017-05-21 DIAGNOSIS — N186 End stage renal disease: Secondary | ICD-10-CM

## 2017-05-21 DIAGNOSIS — D631 Anemia in chronic kidney disease: Secondary | ICD-10-CM | POA: Diagnosis present

## 2017-05-21 DIAGNOSIS — I10 Essential (primary) hypertension: Secondary | ICD-10-CM

## 2017-05-21 DIAGNOSIS — R0602 Shortness of breath: Secondary | ICD-10-CM | POA: Diagnosis present

## 2017-05-21 DIAGNOSIS — N185 Chronic kidney disease, stage 5: Secondary | ICD-10-CM | POA: Diagnosis not present

## 2017-05-21 DIAGNOSIS — N179 Acute kidney failure, unspecified: Principal | ICD-10-CM

## 2017-05-21 DIAGNOSIS — J181 Lobar pneumonia, unspecified organism: Secondary | ICD-10-CM | POA: Diagnosis present

## 2017-05-21 DIAGNOSIS — N2581 Secondary hyperparathyroidism of renal origin: Secondary | ICD-10-CM | POA: Diagnosis present

## 2017-05-21 DIAGNOSIS — Z9889 Other specified postprocedural states: Secondary | ICD-10-CM

## 2017-05-21 DIAGNOSIS — Z419 Encounter for procedure for purposes other than remedying health state, unspecified: Secondary | ICD-10-CM

## 2017-05-21 DIAGNOSIS — E611 Iron deficiency: Secondary | ICD-10-CM | POA: Diagnosis present

## 2017-05-21 DIAGNOSIS — J189 Pneumonia, unspecified organism: Secondary | ICD-10-CM

## 2017-05-21 LAB — URINALYSIS, ROUTINE W REFLEX MICROSCOPIC
Bilirubin Urine: NEGATIVE
Glucose, UA: 50 mg/dL — AB
KETONES UR: NEGATIVE mg/dL
Leukocytes, UA: NEGATIVE
Nitrite: NEGATIVE
PROTEIN: 100 mg/dL — AB
Specific Gravity, Urine: 1.006 (ref 1.005–1.030)
pH: 6 (ref 5.0–8.0)

## 2017-05-21 LAB — I-STAT CHEM 8, ED
BUN: 108 mg/dL — AB (ref 6–20)
CHLORIDE: 103 mmol/L (ref 101–111)
Calcium, Ion: 1.13 mmol/L — ABNORMAL LOW (ref 1.15–1.40)
Glucose, Bld: 94 mg/dL (ref 65–99)
HEMATOCRIT: 16 % — AB (ref 39.0–52.0)
Hemoglobin: 5.4 g/dL — CL (ref 13.0–17.0)
POTASSIUM: 4.1 mmol/L (ref 3.5–5.1)
Sodium: 132 mmol/L — ABNORMAL LOW (ref 135–145)
TCO2: 18 mmol/L — AB (ref 22–32)

## 2017-05-21 LAB — I-STAT TROPONIN, ED: TROPONIN I, POC: 0.06 ng/mL (ref 0.00–0.08)

## 2017-05-21 LAB — CBC
HCT: 18.1 % — ABNORMAL LOW (ref 39.0–52.0)
HEMOGLOBIN: 6.1 g/dL — AB (ref 13.0–17.0)
MCH: 28.8 pg (ref 26.0–34.0)
MCHC: 33.7 g/dL (ref 30.0–36.0)
MCV: 85.4 fL (ref 78.0–100.0)
Platelets: 151 10*3/uL (ref 150–400)
RBC: 2.12 MIL/uL — AB (ref 4.22–5.81)
RDW: 13.2 % (ref 11.5–15.5)
WBC: 6.1 10*3/uL (ref 4.0–10.5)

## 2017-05-21 LAB — BASIC METABOLIC PANEL
Anion gap: 16 — ABNORMAL HIGH (ref 5–15)
BUN: 115 mg/dL — ABNORMAL HIGH (ref 6–20)
CALCIUM: 8.6 mg/dL — AB (ref 8.9–10.3)
CO2: 14 mmol/L — ABNORMAL LOW (ref 22–32)
CREATININE: 21.8 mg/dL — AB (ref 0.61–1.24)
Chloride: 101 mmol/L (ref 101–111)
GFR, EST AFRICAN AMERICAN: 2 mL/min — AB (ref >60)
GFR, EST NON AFRICAN AMERICAN: 2 mL/min — AB (ref >60)
Glucose, Bld: 105 mg/dL — ABNORMAL HIGH (ref 65–99)
Potassium: 3.8 mmol/L (ref 3.5–5.1)
SODIUM: 131 mmol/L — AB (ref 135–145)

## 2017-05-21 LAB — PREPARE RBC (CROSSMATCH)

## 2017-05-21 LAB — CBG MONITORING, ED: Glucose-Capillary: 93 mg/dL (ref 65–99)

## 2017-05-21 LAB — POC OCCULT BLOOD, ED: Fecal Occult Bld: NEGATIVE

## 2017-05-21 MED ORDER — CARVEDILOL 25 MG PO TABS
25.0000 mg | ORAL_TABLET | Freq: Two times a day (BID) | ORAL | Status: DC
  Administered 2017-05-22 – 2017-05-25 (×5): 25 mg via ORAL
  Filled 2017-05-21 (×5): qty 1

## 2017-05-21 MED ORDER — ZOLPIDEM TARTRATE 5 MG PO TABS
5.0000 mg | ORAL_TABLET | Freq: Every evening | ORAL | Status: DC | PRN
  Administered 2017-05-23: 5 mg via ORAL
  Filled 2017-05-21: qty 1

## 2017-05-21 MED ORDER — DOCUSATE SODIUM 283 MG RE ENEM
1.0000 | ENEMA | RECTAL | Status: DC | PRN
  Filled 2017-05-21: qty 1

## 2017-05-21 MED ORDER — HYDRALAZINE HCL 20 MG/ML IJ SOLN
5.0000 mg | INTRAMUSCULAR | Status: DC | PRN
  Administered 2017-05-21: 5 mg via INTRAVENOUS
  Filled 2017-05-21: qty 1

## 2017-05-21 MED ORDER — SODIUM CHLORIDE 0.9 % IV SOLN
500.0000 mg | Freq: Once | INTRAVENOUS | Status: AC
  Administered 2017-05-21: 500 mg via INTRAVENOUS
  Filled 2017-05-21: qty 500

## 2017-05-21 MED ORDER — SODIUM CHLORIDE 0.9 % IV SOLN
1.0000 g | Freq: Once | INTRAVENOUS | Status: AC
  Administered 2017-05-21: 1 g via INTRAVENOUS
  Filled 2017-05-21: qty 10

## 2017-05-21 MED ORDER — ONDANSETRON HCL 4 MG/2ML IJ SOLN
4.0000 mg | Freq: Four times a day (QID) | INTRAMUSCULAR | Status: DC | PRN
Start: 2017-05-21 — End: 2017-05-25

## 2017-05-21 MED ORDER — SIMVASTATIN 20 MG PO TABS
20.0000 mg | ORAL_TABLET | Freq: Every day | ORAL | Status: DC
  Administered 2017-05-22 – 2017-05-25 (×3): 20 mg via ORAL
  Filled 2017-05-21 (×3): qty 1

## 2017-05-21 MED ORDER — ACETAMINOPHEN 650 MG RE SUPP: 650.0000 mg | Freq: Four times a day (QID) | RECTAL | Status: DC | PRN

## 2017-05-21 MED ORDER — ACETAMINOPHEN 325 MG PO TABS
650.0000 mg | ORAL_TABLET | Freq: Four times a day (QID) | ORAL | Status: DC | PRN
  Filled 2017-05-21: qty 2

## 2017-05-21 MED ORDER — NEPRO/CARBSTEADY PO LIQD
237.0000 mL | Freq: Three times a day (TID) | ORAL | Status: DC | PRN
  Filled 2017-05-21: qty 237

## 2017-05-21 MED ORDER — HYDRALAZINE HCL 50 MG PO TABS
75.0000 mg | ORAL_TABLET | Freq: Three times a day (TID) | ORAL | Status: DC
  Administered 2017-05-21 – 2017-05-22 (×3): 75 mg via ORAL
  Filled 2017-05-21 (×3): qty 1

## 2017-05-21 MED ORDER — ONDANSETRON HCL 4 MG PO TABS: 4.0000 mg | ORAL_TABLET | Freq: Four times a day (QID) | ORAL | Status: DC | PRN

## 2017-05-21 MED ORDER — HYDROXYZINE HCL 25 MG PO TABS: 25.0000 mg | ORAL_TABLET | Freq: Three times a day (TID) | ORAL | Status: DC | PRN

## 2017-05-21 MED ORDER — SODIUM CHLORIDE 0.9 % IV SOLN
INTRAVENOUS | Status: DC
  Administered 2017-05-21 – 2017-05-22 (×2): via INTRAVENOUS

## 2017-05-21 MED ORDER — HEPARIN SODIUM (PORCINE) 1000 UNIT/ML IJ SOLN
INTRAMUSCULAR | Status: AC
  Filled 2017-05-21: qty 1

## 2017-05-21 MED ORDER — CAMPHOR-MENTHOL 0.5-0.5 % EX LOTN
1.0000 "application " | TOPICAL_LOTION | Freq: Three times a day (TID) | CUTANEOUS | Status: DC | PRN
  Filled 2017-05-21: qty 222

## 2017-05-21 MED ORDER — ASPIRIN EC 81 MG PO TBEC
81.0000 mg | DELAYED_RELEASE_TABLET | Freq: Every day | ORAL | Status: DC
  Administered 2017-05-22 – 2017-05-25 (×3): 81 mg via ORAL
  Filled 2017-05-21 (×4): qty 1

## 2017-05-21 MED ORDER — SORBITOL 70 % SOLN: 30.0000 mL | Status: DC | PRN

## 2017-05-21 MED ORDER — AMLODIPINE BESYLATE 10 MG PO TABS
10.0000 mg | ORAL_TABLET | Freq: Every day | ORAL | Status: DC
  Administered 2017-05-23 – 2017-05-25 (×2): 10 mg via ORAL
  Filled 2017-05-21 (×3): qty 1

## 2017-05-21 MED ORDER — SODIUM CHLORIDE 0.9 % IV SOLN
1.0000 g | INTRAVENOUS | Status: DC
  Filled 2017-05-21: qty 10

## 2017-05-21 MED ORDER — LIDOCAINE HCL 1 % IJ SOLN
INTRAMUSCULAR | Status: AC
  Filled 2017-05-21: qty 20

## 2017-05-21 MED ORDER — SODIUM CHLORIDE 0.9 % IV SOLN
Freq: Once | INTRAVENOUS | Status: AC
  Administered 2017-05-21: 10 mL/h via INTRAVENOUS

## 2017-05-21 MED ORDER — CALCIUM CARBONATE ANTACID 1250 MG/5ML PO SUSP
500.0000 mg | Freq: Four times a day (QID) | ORAL | Status: DC | PRN
  Filled 2017-05-21: qty 5

## 2017-05-21 NOTE — ED Notes (Signed)
Pt ambulated to room, pt had to stop midway due to SOB. Pt was able to use urinal beside of bed, 115mL.

## 2017-05-21 NOTE — ED Provider Notes (Signed)
[facility] EMERGENCY DEPARTMENT Provider Note   CSN: [id number] Arrival date & time: 05/21/17  0827     History   Chief Complaint Chief Complaint  Patient presents with  . Shortness of Breath    HPI Colin [name] is a 52 y.o. male 3 of chronic kidney disease, hypertension, hyperparathyroidism, descending aortic aneurysm (status post repair), presenting to the ED with 1 month of worsening shortness of breath on exertion, malaise, and nausea and vomiting.  Patient states he has significant shortness of breath with any physical activity, gets winded when walking from his car into work.  Also reports associated productive cough, with subjective intermittent fevers and chills.  States he has had new lower extremity swelling times 2-3 days.  He denies chest pain, abdominal pain, decreased urine output, melena, hematochezia, hematemesis.  Per chart review, patient admitted in July 2018 for acute renal failure with creatinine of 6 and hypertensive emergency.  Patient was discharged with improving creatinine, and recommendations to follow-up outpatient with nephrology.  Patient states he has not followed up with nephrology since July.  Does state he takes his blood pressure medications as prescribed.  The history is provided by the patient.    Past Medical History:  Diagnosis Date  . Descending aortic aneurysm ([place]) 2010   repaired by Dr. Gilford Raid  . ETOH abuse   . 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 Hypertension   . Thyroid nodule     Patient Active Problem List   Diagnosis Date Noted  . Acute renal failure (ARF) ([place]) 05/21/2017  . Anemia 09/27/2016  . Thrombocytopenia ([place]) 09/27/2016  . ARF (acute renal failure) ([place]) 09/27/2016  . Hypertensive emergency 06/17/2016  . Acute kidney injury ([place]) 06/17/2016  . Hypokalemia 06/17/2016  . Elevated transaminase level 06/17/2016  . Elevated troponin 06/17/2016  . Dyspnea 06/17/2016  . Hypertension    . ETOH abuse   . Hyperparathyroidism, primary ([place]) 05/15/2012  . Thyroid nodule, uninodular, left lobe 05/15/2012  . Descending aortic aneurysm ([place]) 03/20/2008    Past Surgical History:  Procedure Laterality Date  . CARDIAC SURGERY     pt unsure of actual Sx - knows it was due to "anurysm"  . THROAT SURGERY         Home Medications    Prior to Admission medications   Medication Sig Start Date End Date Taking? Authorizing Provider  amLODipine (NORVASC) 10 MG tablet Take 1 tablet (10 mg total) by mouth daily. 09/30/16  Yes Bonnielee Haff, MD  aspirin EC 81 MG EC tablet Take 1 tablet (81 mg total) by mouth daily. 10/01/16  Yes Bonnielee Haff, MD  Blood Pressure Monitoring (BLOOD PRESSURE CUFF) MISC Use blood pressure cuff to measure your blood pressure at the same time each day. Record each blood pressure and bring to your primary care physician. 06/20/16  Yes Dessa Phi, DO  carvedilol (COREG) 12.5 MG tablet Take 2 tablets (25 mg total) by mouth 2 (two) times daily with a meal. 09/30/16  Yes Bonnielee Haff, MD  simvastatin (ZOCOR) 20 MG tablet Take 20 mg by mouth daily.   Yes [provider]  furosemide (LASIX) 40 MG tablet Take 1 tablet (40 mg total) by mouth 2 (two) times daily. Patient not taking: Reported on 05/21/2017 09/30/16   Bonnielee Haff, MD  hydrALAZINE (APRESOLINE) 25 MG tablet Take 3 tablets (75 mg total) by mouth every 8 (eight) hours. Patient not taking: Reported on 05/21/2017 09/30/16   Bonnielee Haff, MD  Family History Family History  Problem Relation Age of Onset  . Diabetes Mother   . Hypertension Father   . Diabetes Sister     Social History Social History   Tobacco Use  . Smoking status: Never Smoker  . Smokeless tobacco: Never Used  Substance Use Topics  . Alcohol use: Yes    Alcohol/week: 3.0 oz    Types: 5 Cans of beer per week    Comment: previousy reported 35 beers a week, but now maybe 1-2 bottles on weekend days  . Drug use:  No     Allergies   Patient has no known allergies.   Review of Systems Review of Systems  Constitutional: Positive for chills and fever.  Respiratory: Positive for cough and shortness of breath.   Cardiovascular: Positive for leg swelling. Negative for chest pain.  Gastrointestinal: Positive for nausea and vomiting. Negative for abdominal pain and blood in stool.  Genitourinary: Negative for decreased urine volume and dysuria.  All other systems reviewed and are negative.    Physical Exam Updated Vital Signs BP (!) 154/87   Pulse 71   Temp 97.7 F (36.5 C) (Oral)   Resp 11   Ht 5\' 10"  (1.778 m)   Wt 81.6 kg (180 lb)   SpO2 97%   BMI 25.83 kg/m   Physical Exam  Constitutional: He is oriented to person, place, and time. He appears well-developed and well-nourished. No distress.  HENT:  Head: Normocephalic and atraumatic.  Mouth/Throat: Oropharynx is clear and moist.  Eyes: Conjunctivae and EOM are normal. Pupils are equal, round, and reactive to light.  Neck: Normal range of motion. Neck supple.  Cardiovascular: Normal rate, regular rhythm, normal heart sounds and intact distal pulses.  Pulmonary/Chest: Effort normal. No stridor. No respiratory distress. He has no wheezes. He has no rales.  Diminished lung sounds throughout.  Abdominal: Soft. Bowel sounds are normal. He exhibits no distension. There is no tenderness. There is no rebound and no guarding.  Musculoskeletal:  Bilateral 1+ pretibial pitting edema.  Neurological: He is alert and oriented to person, place, and time.  Skin: Skin is warm.  Psychiatric: He has a normal mood and affect. His behavior is normal.  Nursing note and vitals reviewed.    ED Treatments / Results  Labs (all labs ordered are listed, but only abnormal results are displayed) Labs Reviewed  BASIC METABOLIC PANEL - Abnormal; Notable for the following components:      Result Value   Sodium 131 (*)    CO2 14 (*)    Glucose, Bld 105 (*)     BUN 115 (*)    Creatinine, Ser 21.80 (*)    Calcium 8.6 (*)    GFR calc non Af Amer 2 (*)    GFR calc Af Amer 2 (*)    Anion gap 16 (*)    All other components within normal limits  CBC - Abnormal; Notable for the following components:   RBC 2.12 (*)    Hemoglobin 6.1 (*)    HCT 18.1 (*)    All other components within normal limits  URINALYSIS, ROUTINE W REFLEX MICROSCOPIC - Abnormal; Notable for the following components:   Color, Urine STRAW (*)    Glucose, UA 50 (*)    Hgb urine dipstick SMALL (*)    Protein, ur 100 (*)    Bacteria, UA RARE (*)    Squamous Epithelial / LPF 0-5 (*)    All other components within normal limits  I-STAT CHEM 8, ED - Abnormal; Notable for the following components:   Sodium 132 (*)    BUN 108 (*)    Creatinine, Ser >18.00 (*)    Calcium, Ion 1.13 (*)    TCO2 18 (*)    Hemoglobin 5.4 (*)    HCT 16.0 (*)    All other components within normal limits  I-STAT TROPONIN, ED  POC OCCULT BLOOD, ED  TYPE AND SCREEN  PREPARE RBC (CROSSMATCH)    EKG  EKG Interpretation  Date/Time:  Monday May 21 2017 08:43:18 EST Ventricular Rate:  76 PR Interval:  204 QRS Duration: 110 QT Interval:  432 QTC Calculation: 486 R Axis:   90 Text Interpretation:  Normal sinus rhythm Rightward axis Prolonged QT Abnormal ECG Confirmed by Fredia Sorrow [id number] on 05/21/2017 9:58:57 AM       Radiology Dg Chest 2 View  Result Date: 05/21/2017 CLINICAL DATA:  Shortness of breath, weakness, nonproductive cough, and decreased appetite for 1 month. Vomiting for 1-2 weeks. EXAM: CHEST  2 VIEW COMPARISON:  09/27/2016 FINDINGS: Prior sternotomy and cardiomegaly are again noted. Central pulmonary vascular congestion is similar to the prior study. There is new mild elevation of the right hemidiaphragm with new right basilar airspace opacity and a suspected small right pleural effusion. No pneumothorax is identified. No acute osseous abnormality is seen. IMPRESSION: 1. New  right basilar airspace opacity concerning for pneumonia and possible small pleural effusion. Followup PA and lateral chest X-ray is recommended in 3-4 weeks following trial of antibiotic therapy to ensure resolution and exclude underlying malignancy. 2. Chronic cardiomegaly and mild pulmonary vascular congestion. Electronically Signed   By: Logan Bores M.D.   On: 05/21/2017 09:19    Procedures Procedures (including critical care time) CRITICAL CARE Performed by: [place] N Martia Dalby   Total critical care time: 30 minutes  Critical care time was exclusive of separately billable procedures and treating other patients.  Critical care was necessary to treat or prevent imminent or life-threatening deterioration.  Critical care was time spent personally by me on the following activities: development of treatment plan with patient and/or surrogate as well as nursing, discussions with consultants, evaluation of patient's response to treatment, examination of patient, obtaining history from patient or surrogate, ordering and performing treatments and interventions, ordering and review of laboratory studies, ordering and review of radiographic studies, pulse oximetry and re-evaluation of patient's condition.   Medications Ordered in ED Medications  azithromycin (ZITHROMAX) 500 mg in sodium chloride 0.9 % 250 mL IVPB (500 mg Intravenous New Bag/Given 05/21/17 1600)  0.9 %  sodium chloride infusion (10 mL/hr Intravenous New Bag/Given 05/21/17 1347)  cefTRIAXone (ROCEPHIN) 1 g in sodium chloride 0.9 % 100 mL IVPB (0 g Intravenous Stopped 05/21/17 1438)     Initial Impression / Assessment and Plan / ED Course  I have reviewed the triage vital signs and the nursing notes.  Pertinent labs & imaging results that were available during my care of the patient were reviewed by me and considered in my medical decision making (see chart for details).  Clinical Course as of May 21 1636  Mon May 21, 2017  1359 Dr.  Lorrene Reid with Nephrology consulted. Will see patient on admission. Possibly will place temporary cath tonight, though decision pending. Requests pt remain NPO. Will consult [facility] for admission.  [JR]    Clinical Course User Index [JR] Lamyah Creed, [place] N, PA-C    Presenting to the ED with 1 month of worsening shortness of breath  on exertion and malaise.  Easily winded with any ambulation in the ED.  History of chronic kidney disease with admission in July 2018.  Patient has not established care with nephrology outpatient since that time.  On exam, patient not distressed, lung sounds diminished bilaterally.  Bilateral 1+ pretibial pitting edema.  BMP with significantly elevated creatinine 21.8, BUN 115, normal potassium.  Hemoglobin significantly low at 6.9, hematocrit 18.1.  Patient also acidotic with bicarb of 14 and anion gap 16.  Troponin 0.06.  Chest x-ray showing right pleural effusion with infiltrate consistent with pneumonia.  Blood products ordered, suspect anemia secondary to acute renal failure.  Azithromycin and Rocephin ordered for community-acquired pneumonia.  Nephrology consulted, spoke with Dr. Lorrene Reid who will see patient on admission.  Does not recommend emergent dialysis tonight, however suggests possible temporary catheter placement tonight.  Dr. Lorin Mercy with [facility] hospitalist accepting admission.  Patient discussed with and seen by Dr. Rogene Houston.  The patient appears reasonably stabilized for admission considering the current resources, flow, and capabilities available in the ED at this time, and I doubt any other [facility] requiring further screening and/or treatment in the ED prior to admission.  Final Clinical Impressions(s) / ED Diagnoses   Final diagnoses:  Acute renal failure, unspecified acute renal failure type ([place])  Anemia, unspecified type  Community acquired pneumonia of right lower lobe of lung [facility])    ED Discharge Orders    None       Mika Griffitts, [place] N,  PA-C 05/21/17 1638    Fredia Sorrow, MD 05/22/17 [phone]

## 2017-05-21 NOTE — Progress Notes (Signed)
New Admission Note:  Arrival Method: Via stretcher from ED. Mental Orientation: Alert & Oriented x4 Telemetry: CCMD verified Assessment: Completed Skin: Refer to flowsheet IV: Left Forearm & Left Hand Pain: 0/10 Tubes: Safety Measures: Safety Fall Prevention Plan discussed with patient. Admission: Completed 5 [place] Orientation: Patient has been orientated to the room, unit and the staff.  Orders have been reviewed and implemented. Will continue to monitor the patient. Call light has been placed within reach.   Vassie Moselle, RN  Phone Number: [phone]

## 2017-05-21 NOTE — Consult Note (Signed)
[facility] Consultation Note Requesting Physician:  EDP Primary Nephrologist: Justin Mend Reason for Consult:  New ESRD  HPI: The patient is a 52 y.o. year-old man from [place], [place] citizen, known CKD. Other PMH HTN, medical non-compliance. Last admission July 2018 creatinine of 5. Was to have followed up at [facility]. Essentially lost to f/u since that time, comes to ED with SOB, anorexia, nausea, vomiting, edema. Hb 6, creatinine 21, BUN 108. We are asked to see. Pt understands will need to start HD and is agreeable.   Date/Time Value  05/21/2017 12:57 PM >18.00 (H)  05/21/2017 08:45 AM 21.80 (H)  09/30/2016 05:58 AM 5.31 (H)  09/29/2016 05:37 AM 5.21 (H)  09/28/2016 04:31 AM 5.82 (H)  09/27/2016 09:15 PM 6.04 (H)  06/20/2016 07:12 AM 3.96 (H)  06/19/2016 02:54 AM 4.00 (H)  06/18/2016 02:32 AM 4.22 (H)  06/18/2016 02:32 AM 4.22 (H)  06/17/2016 07:16 PM 4.17 (H)  06/17/2016 11:13 AM 4.19 (H)  06/17/2016 07:48 AM 4.60 (H)    Past Medical History:  Diagnosis Date  . Descending aortic aneurysm ([place]) 2010   repaired by Dr. Gilford Raid  . ETOH abuse   . 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 Hypertension   . Thyroid nodule     Past Surgical History:  Procedure Laterality Date  . CARDIAC SURGERY     pt unsure of actual Sx - knows it was due to "anurysm"  . THROAT SURGERY      Family History  Problem Relation Age of Onset  . Diabetes Mother   . Hypertension Father   . Diabetes Sister    Social History:  reports that  has never smoked. he has never used smokeless tobacco. He reports that he drinks about 3.0 oz of alcohol per week. He reports that he does not use drugs.  Allergies: No Known Allergies  Home medications: Prior to Admission medications   Medication Sig Start Date End Date Taking? Authorizing Provider  amLODipine (NORVASC) 10 MG tablet Take 1 tablet (10 mg total) by mouth daily. 09/30/16  Yes Bonnielee Haff, MD  aspirin EC 81 MG EC tablet Take 1 tablet (81 mg total)  by mouth daily. 10/01/16  Yes Bonnielee Haff, MD  Blood Pressure Monitoring (BLOOD PRESSURE CUFF) MISC Use blood pressure cuff to measure your blood pressure at the same time each day. Record each blood pressure and bring to your primary care physician. 06/20/16  Yes Dessa Phi, DO  carvedilol (COREG) 12.5 MG tablet Take 2 tablets (25 mg total) by mouth 2 (two) times daily with a meal. 09/30/16  Yes Bonnielee Haff, MD  simvastatin (ZOCOR) 20 MG tablet Take 20 mg by mouth daily.   Yes [provider]  furosemide (LASIX) 40 MG tablet Take 1 tablet (40 mg total) by mouth 2 (two) times daily. Patient not taking: Reported on 05/21/2017 09/30/16   Bonnielee Haff, MD  hydrALAZINE (APRESOLINE) 25 MG tablet Take 3 tablets (75 mg total) by mouth every 8 (eight) hours. Patient not taking: Reported on 05/21/2017 09/30/16   Bonnielee Haff, MD    Inpatient medications:   Review of Systems + anorexia, nausea, vomiting >1 month + LE swelling 2-3 days + SOB primarily with exertion  Physical Exam:  Blood pressure (!) 168/97, pulse 73, temperature 97.7 F (36.5 C), temperature source Oral, resp. rate (!) 24, height 5\' 10"  (1.778 m), weight 81.6 kg (180 lb), SpO2 97 %.  Gen: Fairly well appearing gentlman Lines/tubes: none Skin: no rash, cyanosis Neck:  no JVD, no bruits or LAN Chest: Anteriorly clear Heart: S1S2 No S3 or pericardial rub Abdomen: soft, no focal tenderness Ext: Trace edema Neuro: alert, Ox3, no focal deficit Heme/Lymph: no bruising or LAN Dialysis Access: None + asterixus  Recent Labs  Lab 05/21/17 0845 05/21/17 1257  NA 131* 132*  K 3.8 4.1  CL 101 103  CO2 14*  --   GLUCOSE 105* 94  BUN 115* 108*  CREATININE 21.80* >18.00*  CALCIUM 8.6*  --     Recent Labs  Lab 05/21/17 0845 05/21/17 1257  WBC 6.1  --   HGB 6.1* 5.4*  HCT 18.1* 16.0*  MCV 85.4  --   PLT 151  --     Xrays/Other Studies: Dg Chest 2 View  Result Date: 05/21/2017 CLINICAL DATA:  Shortness  of breath, weakness, nonproductive cough, and decreased appetite for 1 month. Vomiting for 1-2 weeks. EXAM: CHEST  2 VIEW COMPARISON:  09/27/2016 FINDINGS: Prior sternotomy and cardiomegaly are again noted. Central pulmonary vascular congestion is similar to the prior study. There is new mild elevation of the right hemidiaphragm with new right basilar airspace opacity and a suspected small right pleural effusion. No pneumothorax is identified. No acute osseous abnormality is seen. IMPRESSION: 1. New right basilar airspace opacity concerning for pneumonia and possible small pleural effusion. Followup PA and lateral chest X-ray is recommended in 3-4 weeks following trial of antibiotic therapy to ensure resolution and exclude underlying malignancy. 2. Chronic cardiomegaly and mild pulmonary vascular congestion. Electronically Signed   By: Logan Bores M.D.   On: 05/21/2017 09:19   Background:  PMH HNT, medical non-compliance, progressive CKD with failure to f/u at [facility]. Last hosp 09/2016 w/creatinine 5. Was to have followed up with Dr. Justin Mend 10/17/16. Comes to ED with uremic symptoms, Hb 6, creatinine 22 - new ESRD. PNA on CXR as well as edema.   Assessment/Recommendations  1. New ESRD - temp cath (because of PNA) then tunnel after ATB's done. Will need daily HD starting tomorrow. CLIP. Vein mapping ordered. Save L arm.  VVS consult in the AM. Ask IR to place the temp cath. Check PTH.  2. Anemia - probably just ESRD. Checking Fe studies. Being transfused currently. Check stools 3. HTN - meds 4. PNA - R basilar opacity on CXR. ATB's per primary team.  Jamal Maes,  MD [place] [phone] pager 05/21/2017, 3:38 PM

## 2017-05-21 NOTE — ED Provider Notes (Signed)
Medical screening examination/treatment/procedure(s) were conducted as a shared visit with non-physician practitioner(s) and myself.  I personally evaluated the patient during the encounter.   EKG Interpretation  Date/Time:  Monday May 21 2017 08:43:18 EST Ventricular Rate:  76 PR Interval:  204 QRS Duration: 110 QT Interval:  432 QTC Calculation: 486 R Axis:   90 Text Interpretation:  Normal sinus rhythm Rightward axis Prolonged QT Abnormal ECG Confirmed by Fredia Sorrow [phone]) on 05/21/2017 9:58:57 AM      Patient seen by me along with the physician assistant.  Patient presenting with acute on chronic renal failure.  Patient is not currently undergoing dialysis.  However potassium was very normal at 3.8.  Very surprising but the rest of his picture fits into uremia.  Nephrology consulted they concur.  Patient will be admitted.  Patient also with significant anemia.  No evidence of any gross GI blood loss.  Hemoglobin was 6.1.  Patient will need a blood transfusion times 2 units slowly.  CRITICAL CARE Performed by: Fredia Sorrow Total critical care time: 30 minutes Critical care time was exclusive of separately billable procedures and treating other patients. Critical care was necessary to treat or prevent imminent or life-threatening deterioration. Critical care was time spent personally by me on the following activities: development of treatment plan with patient and/or surrogate as well as nursing, discussions with consultants, evaluation of patient's response to treatment, examination of patient, obtaining history from patient or surrogate, ordering and performing treatments and interventions, ordering and review of laboratory studies, ordering and review of radiographic studies, pulse oximetry and re-evaluation of patient's condition.    Fredia Sorrow, MD 05/21/17 [phone]

## 2017-05-21 NOTE — ED Notes (Signed)
2 Units of Blood is ready, per blood bank. Notified Hassan Rowan RN @ [phone].

## 2017-05-21 NOTE — ED Notes (Signed)
2nd unit PRBC completed with no adverse effect , denies pain/respirations unlabored , VSS Doloris Hall . IV sites intact .

## 2017-05-21 NOTE — H&P (Signed)
History and Physical    Colin [name]:[id number] DOB: [birth year] DOA: 05/21/2017  PCP:  [place] Consultants:  [facility];  Patient coming from: Home - lives with children; NOK: Daughter, [phone]  Chief Complaint: DOE  HPI: Colin [name] is a 52 y.o. male with medical history significant of HTN; hyperparathyroid bone disease; ETOH abuse; and AAA presenting with DOE.  SOB for about a month now, on and off.  "The little that I do I'm tired, especially if I walk."  Loss of appetite, n/v when he does eat.  LE edema x 3 days.  +cough, dry.  ?fevers.  Cramps especially at night - hands, feet, legs.  He was admitted in 7/18- Hypertensive crisis and acute renal failure, encouraged to f/u with renal and has not.  ED Course:  Acute renal failure, creatinine 21.8, Dr. Lorrene Reid consulted.  K+ 3.8.  No emergent HD tonight, possible temp cath. Also with RLL PNA.  Hgb 6.1, likely from renal failure.  Transfused 2 units and given Rocephin and Azithromycin.  Review of Systems: As per HPI; otherwise review of systems reviewed and negative.   Ambulatory Status:  Ambulates without assistance  Past Medical History:  Diagnosis Date  . 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 Gilford Raid  . ETOH abuse   . Hyperparathyroid bone disease ([place])   . Hypertension   . Thyroid nodule     Past Surgical History:  Procedure Laterality Date  . CARDIAC SURGERY     pt unsure of actual Sx - knows it was due to "anurysm"  . THROAT SURGERY      Social History   Socioeconomic History  . Marital status: Married    Spouse name: Not on file  . Number of children: Not on file  . Years of education: Not on file  . Highest education level: Not on file  Social Needs  . Financial resource strain: Not on file  . Food insecurity - worry: Not on file  . Food insecurity - inability: Not on file  . Transportation needs - medical: Not on file  .  Transportation needs - non-medical: Not on file  Occupational History  . Occupation: Building services engineer  Tobacco Use  . Smoking status: Never Smoker  . Smokeless tobacco: Never Used  Substance and Sexual Activity  . Alcohol use: Yes    Alcohol/week: 3.0 oz    Types: 5 Cans of beer per week    Comment: previousy reported 35 beers a week, but now maybe 1-2 bottles on weekend days  . Drug use: No  . Sexual activity: Not on file  Other Topics Concern  . Not on file  Social History Narrative  . Not on file    No Known Allergies  Family History  Problem Relation Age of Onset  . Diabetes Mother   . Hypertension Father   . Diabetes Sister     Prior to Admission medications   Medication Sig Start Date End Date Taking? Authorizing Provider  amLODipine (NORVASC) 10 MG tablet Take 1 tablet (10 mg total) by mouth daily. 09/30/16  Yes Bonnielee Haff, MD  aspirin EC 81 MG EC tablet Take 1 tablet (81 mg total) by mouth daily. 10/01/16  Yes Bonnielee Haff, MD  Blood Pressure Monitoring (BLOOD PRESSURE CUFF) MISC Use blood pressure cuff to measure your blood pressure at the same time each day. Record each blood pressure and bring to your primary care physician. 06/20/16  Yes Dessa Phi, DO  carvedilol (COREG) 12.5 MG tablet Take 2 tablets (25 mg total) by mouth 2 (two) times daily with a meal. 09/30/16  Yes Bonnielee Haff, MD  simvastatin (ZOCOR) 20 MG tablet Take 20 mg by mouth daily.   Yes [provider]  furosemide (LASIX) 40 MG tablet Take 1 tablet (40 mg total) by mouth 2 (two) times daily. Patient not taking: Reported on 05/21/2017 09/30/16   Bonnielee Haff, MD  hydrALAZINE (APRESOLINE) 25 MG tablet Take 3 tablets (75 mg total) by mouth every 8 (eight) hours. Patient not taking: Reported on 05/21/2017 09/30/16   Bonnielee Haff, MD    Physical Exam: Vitals:   05/21/17 1430 05/21/17 1500 05/21/17 1530 05/21/17 1658  BP: (!) 156/92 (!) 169/95 (!) 154/87 (!) 168/93  Pulse: 73  71 71 77  Resp: 13 13 11 20   Temp:  97.7 F (36.5 C)  97.7 F (36.5 C)  TempSrc:  Oral  Oral  SpO2: 96% 96% 97% 98%  Weight:      Height:         General:  Appears ill but is NAD Eyes:  PERRL, EOMI, normal lids, iris ENT:  grossly normal hearing, lips & tongue, mmm Neck:  no LAD, masses or thyromegaly Cardiovascular:  RRR, no m/r/g. 1+ LE edema.  Respiratory:   CTA bilaterally with no wheezes/rales/rhonchi.  Normal respiratory effort. Abdomen:  soft, NT, ND, NABS Back:   normal alignment, no CVAT Skin:  no rash or induration seen on limited exam Musculoskeletal:  grossly normal tone BUE/BLE, good ROM, no bony abnormality Psychiatric:  grossly normal mood and affect, speech fluent and appropriate, AOx3 Neurologic:  CN 2-12 grossly intact, moves all extremities in coordinated fashion, sensation intact    Radiological Exams on Admission: Dg Chest 2 View  Result Date: 05/21/2017 CLINICAL DATA:  Shortness of breath, weakness, nonproductive cough, and decreased appetite for 1 month. Vomiting for 1-2 weeks. EXAM: CHEST  2 VIEW COMPARISON:  09/27/2016 FINDINGS: Prior sternotomy and cardiomegaly are again noted. Central pulmonary vascular congestion is similar to the prior study. There is new mild elevation of the right hemidiaphragm with new right basilar airspace opacity and a suspected small right pleural effusion. No pneumothorax is identified. No acute osseous abnormality is seen. IMPRESSION: 1. New right basilar airspace opacity concerning for pneumonia and possible small pleural effusion. Followup PA and lateral chest X-ray is recommended in 3-4 weeks following trial of antibiotic therapy to ensure resolution and exclude underlying malignancy. 2. Chronic cardiomegaly and mild pulmonary vascular congestion. Electronically Signed   By: Logan Bores M.D.   On: 05/21/2017 09:19    EKG: Independently reviewed.  NSR with rate 76;, prolonged QT 486, nonspecific ST changes with no evidence of  acute ischemia   Labs on Admission: I have personally reviewed the available labs and imaging studies at the time of the admission.  Pertinent labs:   UA: 50 glucose, small Hgb, 100 protein Na++ 131 CO2 14 Glucose 105 BUN 115/Creatinine 21.80/GFR 2; 50/5.31/13 in 7/18 Troponin 0.06 WBC 6.1 Hgb 6.1; 10.1 in 7/18 MCV 85.4, Normal RDW   Assessment/Plan Principal Problem:   ESRD (end stage renal disease) (HCC) Active Problems:   Hypertension   Anemia   CAP (community acquired pneumonia)   ESRD -Patient with prior h/o stage IV-V CKD that appeared to be exacerbated by acute renal failure -He was admitted for this in 7/18 and had scheduled f/u that he did not attend -He is now presenting  with apparent ESRD in need of chronic HD -Nephrology has been consulted and is in agreement -Patient will have temp cath placed by IR -Vascular consult in the AM -Nephrology prn order set utilized -No current hyperkalemia, but will monitor overnight on telemetry  HTN, uncontrolled -Patient does not appear to be taking his medications regularly -BP very poorly controlled today -Will resume home meds - Norvasc, Coreg, Hydralazine -Will order prn IV hydralazine  CAP -While patient's SOB may simply be related to his uremia, he was found to have a probable infiltrate on CXR -Will continue Rocephin and Azithromycin for now  Anemia -Likely associated with CKD -Symptomatic anemia could also be playing a role in his SOB -He was transfused 2 units PRBC in the ER -Will recheck CBC in AM -Guaiac is pending, will re-order    DVT prophylaxis: SCDs Code Status: Full - confirmed with patient Family Communication: None present Disposition Plan:  Home once clinically improved Consults called: Nephrology  Admission status: Admit - It is my clinical opinion that admission to [facility] is reasonable and necessary because of the expectation that this patient will require hospital care that crosses at  least 2 midnights to treat this condition based on the medical complexity of the problems presented.  Given the aforementioned information, the predictability of an adverse outcome is felt to be significant.    Karmen Bongo MD [facility]  If note is complete, please contact covering daytime or nighttime physician. [email or web address] Password [email or web address]  05/21/2017, 5:06 PM

## 2017-05-21 NOTE — ED Notes (Signed)
CBG 93 

## 2017-05-21 NOTE — ED Notes (Signed)
2nd unit PRBC infusing with no adverse effect , respirations unlabored , IV sites intact , denies pain .

## 2017-05-21 NOTE — ED Triage Notes (Addendum)
Patient complains of 1 month of feeling like he cannot get a good breath with decreased appetite and fatigue. Denies CP, patient alert and oriented, also reports intermittent cough. Complains of intermittent/mild chest tightness originally but none now

## 2017-05-22 ENCOUNTER — Inpatient Hospital Stay (HOSPITAL_COMMUNITY): Payer: Managed Care, Other (non HMO)

## 2017-05-22 ENCOUNTER — Other Ambulatory Visit: Payer: Self-pay

## 2017-05-22 ENCOUNTER — Encounter (HOSPITAL_COMMUNITY): Payer: Self-pay | Admitting: Interventional Radiology

## 2017-05-22 DIAGNOSIS — N185 Chronic kidney disease, stage 5: Secondary | ICD-10-CM

## 2017-05-22 HISTORY — PX: IR US GUIDE VASC ACCESS RIGHT: IMG2390

## 2017-05-22 HISTORY — PX: IR FLUORO GUIDE CV LINE RIGHT: IMG2283

## 2017-05-22 LAB — RENAL FUNCTION PANEL
ANION GAP: 15 (ref 5–15)
Albumin: 2.5 g/dL — ABNORMAL LOW (ref 3.5–5.0)
BUN: 113 mg/dL — AB (ref 6–20)
CALCIUM: 8.9 mg/dL (ref 8.9–10.3)
CO2: 15 mmol/L — AB (ref 22–32)
Chloride: 103 mmol/L (ref 101–111)
Creatinine, Ser: 21.37 mg/dL — ABNORMAL HIGH (ref 0.61–1.24)
GFR calc Af Amer: 2 mL/min — ABNORMAL LOW (ref >60)
GFR calc non Af Amer: 2 mL/min — ABNORMAL LOW (ref >60)
GLUCOSE: 137 mg/dL — AB (ref 65–99)
POTASSIUM: 4.1 mmol/L (ref 3.5–5.1)
Phosphorus: 8.6 mg/dL — ABNORMAL HIGH (ref 2.5–4.6)
SODIUM: 133 mmol/L — AB (ref 135–145)

## 2017-05-22 LAB — CBC WITH DIFFERENTIAL/PLATELET
Band Neutrophils: 0 %
Basophils Absolute: 0 10*3/uL (ref 0.0–0.1)
Basophils Relative: 0 %
Blasts: 0 %
EOS PCT: 1 %
Eosinophils Absolute: 0.1 10*3/uL (ref 0.0–0.7)
HEMATOCRIT: 22.1 % — AB (ref 39.0–52.0)
HEMOGLOBIN: 7.3 g/dL — AB (ref 13.0–17.0)
LYMPHS ABS: 0.6 10*3/uL — AB (ref 0.7–4.0)
Lymphocytes Relative: 7 %
MCH: 27.4 pg (ref 26.0–34.0)
MCHC: 33 g/dL (ref 30.0–36.0)
MCV: 83.1 fL (ref 78.0–100.0)
METAMYELOCYTES PCT: 0 %
MONOS PCT: 12 %
MYELOCYTES: 0 %
Monocytes Absolute: 1 10*3/uL (ref 0.1–1.0)
Neutro Abs: 6.4 10*3/uL (ref 1.7–7.7)
Neutrophils Relative %: 80 %
Platelets: 180 10*3/uL (ref 150–400)
Promyelocytes Absolute: 0 %
RBC: 2.66 MIL/uL — AB (ref 4.22–5.81)
RDW: 14.9 % (ref 11.5–15.5)
WBC: 8.1 10*3/uL (ref 4.0–10.5)
nRBC: 0 /100 WBC

## 2017-05-22 LAB — GLUCOSE, CAPILLARY: Glucose-Capillary: 143 mg/dL — ABNORMAL HIGH (ref 65–99)

## 2017-05-22 LAB — IRON AND TIBC
IRON: 20 ug/dL — AB (ref 45–182)
Saturation Ratios: 9 % — ABNORMAL LOW (ref 17.9–39.5)
TIBC: 228 ug/dL — AB (ref 250–450)
UIBC: 208 ug/dL

## 2017-05-22 LAB — FERRITIN: FERRITIN: 736 ng/mL — AB (ref 24–336)

## 2017-05-22 LAB — STREP PNEUMONIAE URINARY ANTIGEN: Strep Pneumo Urinary Antigen: NEGATIVE

## 2017-05-22 MED ORDER — DARBEPOETIN ALFA 200 MCG/0.4ML IJ SOSY
200.0000 ug | PREFILLED_SYRINGE | INTRAMUSCULAR | Status: DC
  Filled 2017-05-22: qty 0.4

## 2017-05-22 MED ORDER — SODIUM CHLORIDE 0.9 % IV SOLN
1.0000 g | INTRAVENOUS | Status: DC
  Administered 2017-05-22 – 2017-05-25 (×3): 1 g via INTRAVENOUS
  Filled 2017-05-22 (×4): qty 10

## 2017-05-22 MED ORDER — LIDOCAINE HCL (PF) 1 % IJ SOLN
INTRAMUSCULAR | Status: DC | PRN
  Administered 2017-05-22: 5 mL

## 2017-05-22 MED ORDER — IOPAMIDOL (ISOVUE-300) INJECTION 61%
INTRAVENOUS | Status: AC
  Administered 2017-05-22: 5 mL
  Filled 2017-05-22: qty 50

## 2017-05-22 MED ORDER — AZITHROMYCIN 500 MG PO TABS
500.0000 mg | ORAL_TABLET | Freq: Every day | ORAL | Status: AC
Start: 2017-05-22 — End: 2017-05-25
  Administered 2017-05-22 – 2017-05-25 (×4): 500 mg via ORAL
  Filled 2017-05-22 (×4): qty 1

## 2017-05-22 MED ORDER — DARBEPOETIN ALFA 200 MCG/0.4ML IJ SOSY
PREFILLED_SYRINGE | INTRAMUSCULAR | Status: AC
  Administered 2017-05-22: 200 ug
  Filled 2017-05-22: qty 0.4

## 2017-05-22 MED ORDER — LIDOCAINE HCL 1 % IJ SOLN
INTRAMUSCULAR | Status: AC
  Filled 2017-05-22: qty 20

## 2017-05-22 MED ORDER — HEPARIN SODIUM (PORCINE) 1000 UNIT/ML IJ SOLN
INTRAMUSCULAR | Status: AC
  Filled 2017-05-22: qty 1

## 2017-05-22 NOTE — Progress Notes (Signed)
[facility] Rounding Note  Subjective/Interval History:  Temp cath placed by IR this AM For HD today Transfused 2 units yesterday None of his labs were drawn this AM  Objective Vital signs in last 24 hours: Vitals:   05/21/17 2030 05/21/17 2030 05/21/17 2121 05/22/17 0525  BP: (!) 178/100 (!) 178/100 (!) 171/81 (!) 162/91  Pulse: 85 93 87 91  Resp: (!) 25 16 16 16   Temp:  98.3 F (36.8 C) 97.9 F (36.6 C) 98.3 F (36.8 C)  TempSrc:  Oral Oral Oral  SpO2: 94% 100% 100% 98%  Weight:      Height:       Weight change:   Intake/Output Summary (Last 24 hours) at 05/22/2017 8416 Last data filed at 05/22/2017 0601 Gross per 24 hour  Intake 2212.5 ml  Output 1075 ml  Net 1137.5 ml   Physical Exam:  Blood pressure (!) 162/91, pulse 91, temperature 98.3 F (36.8 C), temperature source Oral, resp. rate 16, height 5\' 10"  (1.778 m), weight 81.6 kg (180 lb), SpO2 98 %.  Fairly well appearing  5 cm JVP Chest ant clear S1S2 No S3 or pericardial rub Abdomen soft, no focal tenderness Trace LE edema Neuro: alert, Ox3, no focal deficit R IJ temp HD cath in place (IR 3.5) + asterixus  Recent Labs  Lab 05/21/17 0845 05/21/17 1257  NA 131* 132*  K 3.8 4.1  CL 101 103  CO2 14*  --   GLUCOSE 105* 94  BUN 115* 108*  CREATININE 21.80* >18.00*  CALCIUM 8.6*  --     Recent Labs  Lab 05/21/17 0845 05/21/17 1257  WBC 6.1  --   HGB 6.1* 5.4*  HCT 18.1* 16.0*  MCV 85.4  --   PLT 151  --     Recent Labs  Lab 05/21/17 1829 05/22/17 0034  GLUCAP 93 143*   Studies/Results: Dg Chest 2 View  Result Date: 05/21/2017 CLINICAL DATA:  Shortness of breath, weakness, nonproductive cough, and decreased appetite for 1 month. Vomiting for 1-2 weeks. EXAM: CHEST  2 VIEW COMPARISON:  09/27/2016 FINDINGS: Prior sternotomy and cardiomegaly are again noted. Central pulmonary vascular congestion is similar to the prior study. There is new mild elevation of the right hemidiaphragm with new right basilar  airspace opacity and a suspected small right pleural effusion. No pneumothorax is identified. No acute osseous abnormality is seen. IMPRESSION: 1. New right basilar airspace opacity concerning for pneumonia and possible small pleural effusion. Followup PA and lateral chest X-ray is recommended in 3-4 weeks following trial of antibiotic therapy to ensure resolution and exclude underlying malignancy. 2. Chronic cardiomegaly and mild pulmonary vascular congestion. Electronically Signed   By: Logan Bores M.D.   On: 05/21/2017 09:19   Medications: . sodium chloride 75 mL/hr at 05/21/17 2207  . cefTRIAXone (ROCEPHIN)  IV     . amLODipine  10 mg Oral Daily  . aspirin EC  81 mg Oral Daily  . azithromycin  500 mg Oral Daily  . carvedilol  25 mg Oral BID WC  . darbepoetin (ARANESP) injection - DIALYSIS  200 mcg Intravenous Q Tue-HD  . heparin      . hydrALAZINE  75 mg Oral Q8H  . lidocaine      . simvastatin  20 mg Oral q1800    Background:  PMH HTN, medical non-compliance, progressive CKD with failure to f/u at [facility]. Last hosp 09/2016 w/creatinine 5. Was to have followed up with Dr. Justin Mend 10/17/16. Lost to F/U.  Comes to ED with uremic symptoms, Hb 6, creatinine 22 - new ESRD. PNA on CXR as well as edema.   Assessment/Recommendations  1. New ESRD - temp cath placed today IR (because of PNA) then plan to get tunneled after ATB's done. Will need daily HD starting today. Start CLIP. Vein mapping ordered. Save L arm. Consult to [facility] today. Checking PTH. No labs done as ordered today so will need to get in HD 2. Anemia - probably just ESRD. Checking Fe studies. Transfused 2 units on admission. Starting Aranesp. Check stools 3. 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 PNA - R basilar opacity on CXR. ATB's per primary team (rocephin).  Jamal Maes, MD [facility] [phone] pager 05/22/2017, 7:22 AM

## 2017-05-22 NOTE — Consult Note (Addendum)
Hospital Consult    Reason for Consult:  Evaluation for permanent dialysis access Requesting Physician:  Dr. Lorrene Reid MRN #:  [id number]  History of Present Illness: This is a 52 y.o. male with past medical history significant for hypertension, repaired descending aortic aneurysm, and new end-stage renal disease requiring dialysis.  Per patient's chart renal disease is caused by hypertension and medical noncompliance.  He was admitted to the hospital with uremic symptoms.  He is seen in consultation for evaluation for permanent dialysis access.  He is status post placement of temporary catheter in right IJ by interventional radiology this morning.  Patient was also noted to have right lower lobe pneumonia as evidenced by chest x-ray and will complete his antibiotic regimen on Thursday 3/7.  Patient is right-hand dominant and would prefer permanent access to be placed in his left arm.  His left arm has already been restricted.  Vein mapping of bilateral upper extremities has been ordered but not yet done.  Past Medical History:  Diagnosis Date  . 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 Gilford Raid  . ETOH abuse   . Hyperparathyroid bone disease ([place])   . Hypertension   . Thyroid nodule     Past Surgical History:  Procedure Laterality Date  . CARDIAC SURGERY     pt unsure of actual Sx - knows it was due to "anurysm"  . IR FLUORO GUIDE CV LINE RIGHT  05/22/2017  . IR US GUIDE VASC ACCESS RIGHT  05/22/2017  . THROAT SURGERY      No Known Allergies  Prior to Admission medications   Medication Sig Start Date End Date Taking? Authorizing Provider  amLODipine (NORVASC) 10 MG tablet Take 1 tablet (10 mg total) by mouth daily. 09/30/16  Yes Bonnielee Haff, MD  aspirin EC 81 MG EC tablet Take 1 tablet (81 mg total) by mouth daily. 10/01/16  Yes Bonnielee Haff, MD  Blood Pressure Monitoring (BLOOD PRESSURE CUFF) MISC Use blood pressure cuff to measure your blood pressure at the  same time each day. Record each blood pressure and bring to your primary care physician. 06/20/16  Yes Dessa Phi, DO  carvedilol (COREG) 12.5 MG tablet Take 2 tablets (25 mg total) by mouth 2 (two) times daily with a meal. 09/30/16  Yes Bonnielee Haff, MD  simvastatin (ZOCOR) 20 MG tablet Take 20 mg by mouth daily.   Yes [provider]  furosemide (LASIX) 40 MG tablet Take 1 tablet (40 mg total) by mouth 2 (two) times daily. Patient not taking: Reported on 05/21/2017 09/30/16   Bonnielee Haff, MD  hydrALAZINE (APRESOLINE) 25 MG tablet Take 3 tablets (75 mg total) by mouth every 8 (eight) hours. Patient not taking: Reported on 05/21/2017 09/30/16   Bonnielee Haff, MD    Social History   Socioeconomic History  . Marital status: Married    Spouse name: Not on file  . Number of children: Not on file  . Years of education: Not on file  . Highest education level: Not on file  Social Needs  . Financial resource strain: Not on file  . Food insecurity - worry: Not on file  . Food insecurity - inability: Not on file  . Transportation needs - medical: Not on file  . Transportation needs - non-medical: Not on file  Occupational History  . Occupation: Building services engineer  Tobacco Use  . Smoking status: Never Smoker  . Smokeless tobacco: Never Used  Substance and Sexual Activity  .  Alcohol use: Yes    Alcohol/week: 3.0 oz    Types: 5 Cans of beer per week    Comment: previousy reported 35 beers a week, but now maybe 1-2 bottles on weekend days  . Drug use: No  . Sexual activity: Not on file  Other Topics Concern  . Not on file  Social History Narrative  . Not on file     Family History  Problem Relation Age of Onset  . Diabetes Mother   . Hypertension Father   . Diabetes Sister     ROS: Otherwise negative unless mentioned in HPI  Physical Examination  Vitals:   05/22/17 0525 05/22/17 0930  BP: (!) 162/91 (!) 158/88  Pulse: 91 88  Resp: 16 18  Temp: 98.3 F (36.8  C) 98.4 F (36.9 C)  SpO2: 98% 99%   Body mass index is 25.83 kg/m.  General:  WDWN in NAD Gait: Not observed HENT: WNL, normocephalic Pulmonary: normal non-labored breathing, without Rales, rhonchi,  wheezing Cardiac: Tachycardia Abdomen:  soft, NT/ND, no masses Skin: without rashes Vascular Exam/Pulses: Symmetrical radial pulses; symmetrical DP pulses Extremities: without ischemic changes, without Gangrene , without cellulitis; without open wounds;  Musculoskeletal: no muscle wasting or atrophy  Neurologic: A&O X 3;  No focal weakness or paresthesias are detected; speech is fluent/normal Psychiatric:  The pt has Normal affect. Lymph:  Unremarkable  CBC    Component Value Date/Time   WBC 6.1 05/21/2017 0845   RBC 2.12 (L) 05/21/2017 0845   HGB 5.4 (LL) 05/21/2017 1257   HCT 16.0 (L) 05/21/2017 1257   HCT 29.4 (L) 09/28/2016 0431   PLT 151 05/21/2017 0845   MCV 85.4 05/21/2017 0845   MCH 28.8 05/21/2017 0845   MCHC 33.7 05/21/2017 0845   RDW 13.2 05/21/2017 0845   LYMPHSABS 1.8 09/27/2016 2115   MONOABS 0.6 09/27/2016 2115   EOSABS 0.2 09/27/2016 2115   BASOSABS 0.0 09/27/2016 2115    BMET    Component Value Date/Time   NA 132 (L) 05/21/2017 1257   K 4.1 05/21/2017 1257   CL 103 05/21/2017 1257   CO2 14 (L) 05/21/2017 0845   GLUCOSE 94 05/21/2017 1257   BUN 108 (H) 05/21/2017 1257   CREATININE >18.00 (H) 05/21/2017 1257   CALCIUM 8.6 (L) 05/21/2017 0845   GFRNONAA 2 (L) 05/21/2017 0845   GFRAA 2 (L) 05/21/2017 0845    COAGS: Lab Results  Component Value Date   INR 1.6 (H) 11/12/2008   INR 1.0 11/10/2008     Non-Invasive Vascular Imaging:   Vein mapping pending  Statin:  Yes.   Beta Blocker:  Yes.   Aspirin:  Yes.   ACEI:  No. ARB:  No. CCB use:  Yes Other antiplatelets/anticoagulants:  No.    ASSESSMENT/PLAN: This is a 52 y.o. male with new end-stage renal disease requiring permanent dialysis access  Vein mapping bilateral upper  extremities pending Restrict left arm Plan is for exchanged tunneled dialysis catheter as well as AV fistula versus graft prior to discharge home This case will be discussed with on-call surgeon Dr. Scot Dock who will be evaluating the patient later today   Dagoberto Ligas PA-C [facility] [phone]  I have interviewed the patient and examined the patient. I agree with the findings by the PA. Plan placement of left AVF/AVG and TDC on Thursday if no fevers.  Discussed the procedure and risks with the patient. He is agreeable to proceed.   Gae Gallop, MD [phone]

## 2017-05-22 NOTE — H&P (View-Only) (Signed)
Hospital Consult    Reason for Consult:  Evaluation for permanent dialysis access Requesting Physician:  Dr. Lorrene Reid MRN #:  [id number]  History of Present Illness: This is a 52 y.o. male with past medical history significant for hypertension, repaired descending aortic aneurysm, and new end-stage renal disease requiring dialysis.  Per patient's chart renal disease is caused by hypertension and medical noncompliance.  He was admitted to the hospital with uremic symptoms.  He is seen in consultation for evaluation for permanent dialysis access.  He is status post placement of temporary catheter in right IJ by interventional radiology this morning.  Patient was also noted to have right lower lobe pneumonia as evidenced by chest x-ray and will complete his antibiotic regimen on Thursday 3/7.  Patient is right-hand dominant and would prefer permanent access to be placed in his left arm.  His left arm has already been restricted.  Vein mapping of bilateral upper extremities has been ordered but not yet done.  Past Medical History:  Diagnosis Date  . Descending aortic aneurysm ([place]) 2010   repaired by Dr. Gilford Raid  . ETOH abuse   . 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 Hypertension   . Thyroid nodule     Past Surgical History:  Procedure Laterality Date  . CARDIAC SURGERY     pt unsure of actual Sx - knows it was due to "anurysm"  . IR FLUORO GUIDE CV LINE RIGHT  05/22/2017  . IR US GUIDE VASC ACCESS RIGHT  05/22/2017  . THROAT SURGERY      No Known Allergies  Prior to Admission medications   Medication Sig Start Date End Date Taking? Authorizing Provider  amLODipine (NORVASC) 10 MG tablet Take 1 tablet (10 mg total) by mouth daily. 09/30/16  Yes Bonnielee Haff, MD  aspirin EC 81 MG EC tablet Take 1 tablet (81 mg total) by mouth daily. 10/01/16  Yes Bonnielee Haff, MD  Blood Pressure Monitoring (BLOOD PRESSURE CUFF) MISC Use blood pressure cuff to measure your blood pressure at the  same time each day. Record each blood pressure and bring to your primary care physician. 06/20/16  Yes Dessa Phi, DO  carvedilol (COREG) 12.5 MG tablet Take 2 tablets (25 mg total) by mouth 2 (two) times daily with a meal. 09/30/16  Yes Bonnielee Haff, MD  simvastatin (ZOCOR) 20 MG tablet Take 20 mg by mouth daily.   Yes [provider]  furosemide (LASIX) 40 MG tablet Take 1 tablet (40 mg total) by mouth 2 (two) times daily. Patient not taking: Reported on 05/21/2017 09/30/16   Bonnielee Haff, MD  hydrALAZINE (APRESOLINE) 25 MG tablet Take 3 tablets (75 mg total) by mouth every 8 (eight) hours. Patient not taking: Reported on 05/21/2017 09/30/16   Bonnielee Haff, MD    Social History   Socioeconomic History  . Marital status: Married    Spouse name: Not on file  . Number of children: Not on file  . Years of education: Not on file  . Highest education level: Not on file  Social Needs  . Financial resource strain: Not on file  . Food insecurity - worry: Not on file  . Food insecurity - inability: Not on file  . Transportation needs - medical: Not on file  . Transportation needs - non-medical: Not on file  Occupational History  . Occupation: Building services engineer  Tobacco Use  . Smoking status: Never Smoker  . Smokeless tobacco: Never Used  Substance and Sexual Activity  .  Alcohol use: Yes    Alcohol/week: 3.0 oz    Types: 5 Cans of beer per week    Comment: previousy reported 35 beers a week, but now maybe 1-2 bottles on weekend days  . Drug use: No  . Sexual activity: Not on file  Other Topics Concern  . Not on file  Social History Narrative  . Not on file     Family History  Problem Relation Age of Onset  . Diabetes Mother   . Hypertension Father   . Diabetes Sister     ROS: Otherwise negative unless mentioned in HPI  Physical Examination  Vitals:   05/22/17 0525 05/22/17 0930  BP: (!) 162/91 (!) 158/88  Pulse: 91 88  Resp: 16 18  Temp: 98.3 F (36.8  C) 98.4 F (36.9 C)  SpO2: 98% 99%   Body mass index is 25.83 kg/m.  General:  WDWN in NAD Gait: Not observed HENT: WNL, normocephalic Pulmonary: normal non-labored breathing, without Rales, rhonchi,  wheezing Cardiac: Tachycardia Abdomen:  soft, NT/ND, no masses Skin: without rashes Vascular Exam/Pulses: Symmetrical radial pulses; symmetrical DP pulses Extremities: without ischemic changes, without Gangrene , without cellulitis; without open wounds;  Musculoskeletal: no muscle wasting or atrophy  Neurologic: A&O X 3;  No focal weakness or paresthesias are detected; speech is fluent/normal Psychiatric:  The pt has Normal affect. Lymph:  Unremarkable  CBC    Component Value Date/Time   WBC 6.1 05/21/2017 0845   RBC 2.12 (L) 05/21/2017 0845   HGB 5.4 (LL) 05/21/2017 1257   HCT 16.0 (L) 05/21/2017 1257   HCT 29.4 (L) 09/28/2016 0431   PLT 151 05/21/2017 0845   MCV 85.4 05/21/2017 0845   MCH 28.8 05/21/2017 0845   MCHC 33.7 05/21/2017 0845   RDW 13.2 05/21/2017 0845   LYMPHSABS 1.8 09/27/2016 2115   MONOABS 0.6 09/27/2016 2115   EOSABS 0.2 09/27/2016 2115   BASOSABS 0.0 09/27/2016 2115    BMET    Component Value Date/Time   NA 132 (L) 05/21/2017 1257   K 4.1 05/21/2017 1257   CL 103 05/21/2017 1257   CO2 14 (L) 05/21/2017 0845   GLUCOSE 94 05/21/2017 1257   BUN 108 (H) 05/21/2017 1257   CREATININE >18.00 (H) 05/21/2017 1257   CALCIUM 8.6 (L) 05/21/2017 0845   GFRNONAA 2 (L) 05/21/2017 0845   GFRAA 2 (L) 05/21/2017 0845    COAGS: Lab Results  Component Value Date   INR 1.6 (H) 11/12/2008   INR 1.0 11/10/2008     Non-Invasive Vascular Imaging:   Vein mapping pending  Statin:  Yes.   Beta Blocker:  Yes.   Aspirin:  Yes.   ACEI:  No. ARB:  No. CCB use:  Yes Other antiplatelets/anticoagulants:  No.    ASSESSMENT/PLAN: This is a 52 y.o. male with new end-stage renal disease requiring permanent dialysis access  Vein mapping bilateral upper  extremities pending Restrict left arm Plan is for exchanged tunneled dialysis catheter as well as AV fistula versus graft prior to discharge home This case will be discussed with on-call surgeon Dr. Scot Dock who will be evaluating the patient later today   Dagoberto Ligas PA-C [facility] [phone]  I have interviewed the patient and examined the patient. I agree with the findings by the PA. Plan placement of left AVF/AVG and TDC on Thursday if no fevers.  Discussed the procedure and risks with the patient. He is agreeable to proceed.   Gae Gallop, MD [phone]

## 2017-05-22 NOTE — Procedures (Signed)
  Procedure: R IJ 20cm HD catheter placed to svc/ra jct EBL:   minimal Complications:  none immediate  See full dictation in [facility] PACS.  D. Malcolm Quast MD Main # [phone] Pager  [phone]    

## 2017-05-22 NOTE — Progress Notes (Signed)
PROGRESS NOTE    Colin [name]  HWE:[id number] DOB: October 07, 1965 DOA: 05/21/2017 PCP: Wenda Low, MD     Brief Narrative:  Colin [name] is a 52 y.o. male with medical history significant of HTN, hyperparathyroid bone disease, ETOH abuse, CKD, and AAA presenting with dyspnea with exertion.  He admits to worsening shortness of breath for about a month as well as fatigue with exertion.  He was previously seen admitted in July 2018 for hypertensive crisis and acute renal failure.  He was supposed to follow-up nephrology, but has failed to do so.  He now presents with acute kidney injury with creatinine 21.8.  Chest x-ray also revealed right lower lobe he was started on community-acquired pneumonia coverage with Rocephin and azithromycin.  His hemoglobin was low at 6.1, likely due to renal failure.  FOBT was negative.  He was given 2 units packed red blood cells. Nephrology was consulted. IR placed temporary catheter for dialysis initiation.    Assessment & Plan:   Principal Problem:   ESRD (end stage renal disease) ([place]) Active Problems:   Hypertension   Anemia   CAP (community acquired pneumonia)  ESRD  -New to HD, temporary HD catheter placed this morning by IR -Nephrology following for dialysis -Will need vascular surgery consultation for permanent access consideration  -CLIP  RLL CAP -Rocephin/azithromycin -Recommend repeat CXR in 3-4 weeks to ensure resolution of consolidation   Anemia of chronic renal disease -FOBT negative -Iron studies pending -S/p 2u pRBC  -Trend CBC   HTN -Continue norvasc, coreg, hydralazine   HLD -Continue zocor    DVT prophylaxis: SCD Code Status: Full Family Communication: No family at bedside Disposition Plan: Pending HD, vascular access, CLIP process    Consultants:   Nephrology  Procedures:   Temporary HD catheter placed 3/5   Antimicrobials:  Anti-infectives (From admission, onward)   Start     Dose/Rate  Route Frequency Ordered Stop   05/22/17 1700  cefTRIAXone (ROCEPHIN) 1 g in sodium chloride 0.9 % 100 mL IVPB     1 g 200 mL/hr over 30 Minutes Intravenous Every 24 hours 05/22/17 0944     05/22/17 1200  azithromycin (ZITHROMAX) tablet 500 mg     500 mg Oral Daily 05/22/17 0818 05/26/17 1159   05/22/17 0800  cefTRIAXone (ROCEPHIN) 1 g in sodium chloride 0.9 % 100 mL IVPB  Status:  Discontinued     1 g 200 mL/hr over 30 Minutes Intravenous Every 24 hours 05/21/17 2105 05/22/17 0944   05/21/17 1330  cefTRIAXone (ROCEPHIN) 1 g in sodium chloride 0.9 % 100 mL IVPB     1 g 200 mL/hr over 30 Minutes Intravenous  Once 05/21/17 1326 05/21/17 1438   05/21/17 1330  azithromycin (ZITHROMAX) 500 mg in sodium chloride 0.9 % 250 mL IVPB     500 mg 250 mL/hr over 60 Minutes Intravenous  Once 05/21/17 1326 05/21/17 1945       Subjective: Patient feeling well today.  Denies any shortness of breath at rest, worsens with exertion.  Denies any chest pain, abdominal pain, nausea, vomiting.  Objective: Vitals:   05/21/17 2030 05/21/17 2121 05/22/17 0525 05/22/17 0930  BP: (!) 178/100 (!) 171/81 (!) 162/91 (!) 158/88  Pulse: 93 87 91 88  Resp: 16 16 16 18   Temp: 98.3 F (36.8 C) 97.9 F (36.6 C) 98.3 F (36.8 C) 98.4 F (36.9 C)  TempSrc: Oral Oral Oral Oral  SpO2: 100% 100% 98% 99%  Weight:  Height:        Intake/Output Summary (Last 24 hours) at 05/22/2017 1041 Last data filed at 05/22/2017 0601 Gross per 24 hour  Intake 2212.5 ml  Output 900 ml  Net 1312.5 ml   Filed Weights   05/21/17 0839  Weight: 81.6 kg (180 lb)    Examination:  General exam: Appears calm and comfortable  Respiratory system: Diminished breath sounds bilaterally. Respiratory effort normal.  On nasal cannula O2 Cardiovascular system: S1 & S2 heard, RRR. No JVD, murmurs, rubs, gallops or clicks. Gastrointestinal system: Abdomen is nondistended, soft and nontender. No organomegaly or masses felt. Normal bowel  sounds heard. Central nervous system: Alert and oriented. No focal neurological deficits. Extremities: Symmetric  Skin: No rashes, lesions or ulcers Psychiatry: Judgement and insight appear normal. Mood & affect appropriate.   Data Reviewed: I have personally reviewed following labs and imaging studies  CBC: Recent Labs  Lab 05/21/17 0845 05/21/17 1257  WBC 6.1  --   HGB 6.1* 5.4*  HCT 18.1* 16.0*  MCV 85.4  --   PLT 151  --    Basic Metabolic Panel: Recent Labs  Lab 05/21/17 0845 05/21/17 1257  NA 131* 132*  K 3.8 4.1  CL 101 103  CO2 14*  --   GLUCOSE 105* 94  BUN 115* 108*  CREATININE 21.80* >18.00*  CALCIUM 8.6*  --    GFR: CrCl cannot be calculated (This lab value cannot be used to calculate CrCl because it is not a number: >18.00). Liver Function Tests: No results for input(s): AST, ALT, ALKPHOS, BILITOT, PROT, ALBUMIN in the last 168 hours. No results for input(s): LIPASE, AMYLASE in the last 168 hours. No results for input(s): AMMONIA in the last 168 hours. Coagulation Profile: No results for input(s): INR, PROTIME in the last 168 hours. Cardiac Enzymes: No results for input(s): CKTOTAL, CKMB, CKMBINDEX, TROPONINI in the last 168 hours. BNP (last 3 results) No results for input(s): PROBNP in the last 8760 hours. HbA1C: No results for input(s): HGBA1C in the last 72 hours. CBG: Recent Labs  Lab 05/21/17 1829 05/22/17 0034  GLUCAP 93 143*   Lipid Profile: No results for input(s): CHOL, HDL, LDLCALC, TRIG, CHOLHDL, LDLDIRECT in the last 72 hours. Thyroid Function Tests: No results for input(s): TSH, T4TOTAL, FREET4, T3FREE, THYROIDAB in the last 72 hours. Anemia Panel: No results for input(s): VITAMINB12, FOLATE, FERRITIN, TIBC, IRON, RETICCTPCT in the last 72 hours. Sepsis Labs: No results for input(s): PROCALCITON, LATICACIDVEN in the last 168 hours.  No results found for this or any previous visit (from the past 240 hour(s)).     Radiology  Studies: Dg Chest 2 View  Result Date: 05/21/2017 CLINICAL DATA:  Shortness of breath, weakness, nonproductive cough, and decreased appetite for 1 month. Vomiting for 1-2 weeks. EXAM: CHEST  2 VIEW COMPARISON:  09/27/2016 FINDINGS: Prior sternotomy and cardiomegaly are again noted. Central pulmonary vascular congestion is similar to the prior study. There is new mild elevation of the right hemidiaphragm with new right basilar airspace opacity and a suspected small right pleural effusion. No pneumothorax is identified. No acute osseous abnormality is seen. IMPRESSION: 1. New right basilar airspace opacity concerning for pneumonia and possible small pleural effusion. Followup PA and lateral chest X-ray is recommended in 3-4 weeks following trial of antibiotic therapy to ensure resolution and exclude underlying malignancy. 2. Chronic cardiomegaly and mild pulmonary vascular congestion. Electronically Signed   By: Logan Bores M.D.   On: 05/21/2017 09:19  Ir Fluoro Guide Cv Line Right  Result Date: 05/22/2017 CLINICAL DATA:  Renal failure, needs venous access for hemodialysis. EXAM: EXAM RIGHT IJ CATHETER PLACEMENT UNDER ULTRASOUND AND FLUOROSCOPIC GUIDANCE TECHNIQUE: The procedure, risks (including but not limited to bleeding, infection, organ damage, pneumothorax), benefits, and alternatives were explained to the patient. Questions regarding the procedure were encouraged and answered. The patient understands and consents to the procedure. Patency of the right IJ vein was confirmed with ultrasound with image documentation. An appropriate skin site was determined. Skin site was marked. Region was prepped using maximum barrier technique including cap and mask, sterile gown, sterile gloves, large sterile sheet, and Chlorhexidine as cutaneous antisepsis. The region was infiltrated locally with 1% lidocaine. Under real-time ultrasound guidance, the right IJ vein was accessed with a 21 gauge needle; the needle tip  within the vein was confirmed with ultrasound image documentation. The needle exchanged over a 018 guidewire for vascular dilator. The guidewire would not advance centrally. Limited venography demonstrated tortuous stenotic region near the confluence of the right IJ vein and brachiocephalic vein. This was negotiated with an angled Glidewire, and the tract was dilated with serial vascular dilators, which allowed advancement of a 20 cm Mahurkar catheter. This was positioned with the tip at the cavoatrial junction. Spot chest radiograph shows good positioning and no pneumothorax. Catheter was flushed and sutured externally with 0-Prolene sutures. Patient tolerated the procedure well. FLUOROSCOPY TIME:  1.6 minutes; 95  uGym2 DAP COMPLICATIONS: COMPLICATIONS none IMPRESSION: 1. Technically successful right IJ Mahurkar catheter placement. Electronically Signed   By: Lucrezia Europe M.D.   On: 05/22/2017 09:24   Ir US Guide Vasc Access Right  Result Date: 05/22/2017 CLINICAL DATA:  Renal failure, needs venous access for hemodialysis. EXAM: EXAM RIGHT IJ CATHETER PLACEMENT UNDER ULTRASOUND AND FLUOROSCOPIC GUIDANCE TECHNIQUE: The procedure, risks (including but not limited to bleeding, infection, organ damage, pneumothorax), benefits, and alternatives were explained to the patient. Questions regarding the procedure were encouraged and answered. The patient understands and consents to the procedure. Patency of the right IJ vein was confirmed with ultrasound with image documentation. An appropriate skin site was determined. Skin site was marked. Region was prepped using maximum barrier technique including cap and mask, sterile gown, sterile gloves, large sterile sheet, and Chlorhexidine as cutaneous antisepsis. The region was infiltrated locally with 1% lidocaine. Under real-time ultrasound guidance, the right IJ vein was accessed with a 21 gauge needle; the needle tip within the vein was confirmed with ultrasound image  documentation. The needle exchanged over a 018 guidewire for vascular dilator. The guidewire would not advance centrally. Limited venography demonstrated tortuous stenotic region near the confluence of the right IJ vein and brachiocephalic vein. This was negotiated with an angled Glidewire, and the tract was dilated with serial vascular dilators, which allowed advancement of a 20 cm Mahurkar catheter. This was positioned with the tip at the cavoatrial junction. Spot chest radiograph shows good positioning and no pneumothorax. Catheter was flushed and sutured externally with 0-Prolene sutures. Patient tolerated the procedure well. FLUOROSCOPY TIME:  1.6 minutes; 95  uGym2 DAP COMPLICATIONS: COMPLICATIONS none IMPRESSION: 1. Technically successful right IJ Mahurkar catheter placement. Electronically Signed   By: Lucrezia Europe M.D.   On: 05/22/2017 09:24      Scheduled Meds: . amLODipine  10 mg Oral Daily  . aspirin EC  81 mg Oral Daily  . azithromycin  500 mg Oral Daily  . carvedilol  25 mg Oral BID WC  . darbepoetin (ARANESP)  injection - DIALYSIS  200 mcg Intravenous Q Tue-HD  . heparin      . hydrALAZINE  75 mg Oral Q8H  . lidocaine      . simvastatin  20 mg Oral q1800   Continuous Infusions: . sodium chloride 75 mL/hr at 05/22/17 1029  . cefTRIAXone (ROCEPHIN)  IV       LOS: 1 day    Time spent: 40 minutes   Dessa Phi, DO [facility] [email or web address] Password [facility] 05/22/2017, 10:41 AM

## 2017-05-22 NOTE — Procedures (Signed)
I have personally attended this patient's dialysis session.   HD#1 for new ESRD. 200/500/2.5 hours 1 liter goal Tolerating so far via R IJ temp cath  Jamal Maes, MD [place] Pager 05/22/2017, 5:02 PM

## 2017-05-23 ENCOUNTER — Inpatient Hospital Stay (HOSPITAL_COMMUNITY): Payer: Managed Care, Other (non HMO)

## 2017-05-23 DIAGNOSIS — D649 Anemia, unspecified: Secondary | ICD-10-CM

## 2017-05-23 DIAGNOSIS — N186 End stage renal disease: Secondary | ICD-10-CM

## 2017-05-23 DIAGNOSIS — J181 Lobar pneumonia, unspecified organism: Secondary | ICD-10-CM

## 2017-05-23 DIAGNOSIS — N179 Acute kidney failure, unspecified: Principal | ICD-10-CM

## 2017-05-23 LAB — CBC
HCT: 22.5 % — ABNORMAL LOW (ref 39.0–52.0)
Hemoglobin: 7.4 g/dL — ABNORMAL LOW (ref 13.0–17.0)
MCH: 27.7 pg (ref 26.0–34.0)
MCHC: 32.9 g/dL (ref 30.0–36.0)
MCV: 84.3 fL (ref 78.0–100.0)
PLATELETS: 157 10*3/uL (ref 150–400)
RBC: 2.67 MIL/uL — ABNORMAL LOW (ref 4.22–5.81)
RDW: 15.2 % (ref 11.5–15.5)
WBC: 8.5 10*3/uL (ref 4.0–10.5)

## 2017-05-23 LAB — RENAL FUNCTION PANEL
Albumin: 2.5 g/dL — ABNORMAL LOW (ref 3.5–5.0)
Anion gap: 14 (ref 5–15)
BUN: 71 mg/dL — AB (ref 6–20)
CHLORIDE: 102 mmol/L (ref 101–111)
CO2: 20 mmol/L — ABNORMAL LOW (ref 22–32)
CREATININE: 16.3 mg/dL — AB (ref 0.61–1.24)
Calcium: 8.7 mg/dL — ABNORMAL LOW (ref 8.9–10.3)
GFR calc Af Amer: 3 mL/min — ABNORMAL LOW (ref >60)
GFR, EST NON AFRICAN AMERICAN: 3 mL/min — AB (ref >60)
Glucose, Bld: 110 mg/dL — ABNORMAL HIGH (ref 65–99)
Phosphorus: 6.6 mg/dL — ABNORMAL HIGH (ref 2.5–4.6)
Potassium: 3.7 mmol/L (ref 3.5–5.1)
Sodium: 136 mmol/L (ref 135–145)

## 2017-05-23 LAB — HEPATITIS B CORE ANTIBODY, TOTAL: Hep B Core Total Ab: NEGATIVE

## 2017-05-23 LAB — HEPATITIS B SURFACE ANTIGEN: Hepatitis B Surface Ag: NEGATIVE

## 2017-05-23 LAB — PARATHYROID HORMONE, INTACT (NO CA): PTH: 800 pg/mL — ABNORMAL HIGH (ref 15–65)

## 2017-05-23 LAB — HEPATITIS B SURFACE ANTIBODY,QUALITATIVE: Hep B S Ab: NONREACTIVE

## 2017-05-23 MED ORDER — HYDRALAZINE HCL 50 MG PO TABS
50.0000 mg | ORAL_TABLET | Freq: Three times a day (TID) | ORAL | Status: DC
  Administered 2017-05-23 – 2017-05-25 (×3): 50 mg via ORAL
  Filled 2017-05-23 (×3): qty 1

## 2017-05-23 MED ORDER — SEVELAMER CARBONATE 800 MG PO TABS
1600.0000 mg | ORAL_TABLET | Freq: Three times a day (TID) | ORAL | Status: DC
  Administered 2017-05-23 – 2017-05-25 (×4): 1600 mg via ORAL
  Filled 2017-05-23 (×4): qty 2

## 2017-05-23 MED ORDER — ONDANSETRON HCL 4 MG/2ML IJ SOLN
INTRAMUSCULAR | Status: AC
  Filled 2017-05-23: qty 2

## 2017-05-23 MED ORDER — CALCITRIOL 0.5 MCG PO CAPS: 0.5000 ug | ORAL_CAPSULE | Freq: Every day | ORAL | Status: DC

## 2017-05-23 MED ORDER — SODIUM CHLORIDE 0.9% FLUSH: 10.0000 mL | Freq: Two times a day (BID) | INTRAVENOUS | Status: DC

## 2017-05-23 MED ORDER — SODIUM CHLORIDE 0.9% FLUSH: 10.0000 mL | INTRAVENOUS | Status: DC | PRN

## 2017-05-23 MED ORDER — ONDANSETRON HCL 4 MG/2ML IJ SOLN
4.0000 mg | Freq: Once | INTRAMUSCULAR | Status: AC
  Administered 2017-05-23: 4 mg via INTRAVENOUS

## 2017-05-23 MED ORDER — SODIUM CHLORIDE 0.9 % IV SOLN
125.0000 mg | Freq: Every day | INTRAVENOUS | Status: DC
  Administered 2017-05-23 – 2017-05-25 (×3): 125 mg via INTRAVENOUS
  Filled 2017-05-23 (×5): qty 10

## 2017-05-23 MED ORDER — INFLUENZA VAC SPLIT QUAD 0.5 ML IM SUSY: 0.5000 mL | PREFILLED_SYRINGE | INTRAMUSCULAR | Status: DC

## 2017-05-23 NOTE — Progress Notes (Signed)
Upper Extremity Vein Map  Cephalic  Segment Diameter Depth Comment  1. Axilla (shoulder) 3.64mm 4.25mm   2. Mid upper arm 3.13mm 4.90mm Branching  3. Above AC 2.6mm 4.52mm   4. In [employer] 3.31mm 2.32mm   5. Below AC 2.66mm 2.21mm   6. Mid forearm 2.774mm 2.72mm   7. Wrist 1.76mm 2.90mm Branching    Basilic Not evaluated   Cephalic  Segment Diameter Depth Comment  1. Axilla (shoulder) 3.42mm 4.10mm   2. Mid upper arm 2.45mm 2.52mm   3. Above AC 3.53mm 3.56mm   4. In [facility] 2.64mm 2.87mm Branching  5. Below AC 2.67mm 2.18mm Braching  6. Mid forearm 1.33mm 2.42mm Branching  7. Wrist 1.56mm 2.81mm                   Basilic  Segment Diameter Depth Comment  1. Axilla   Unable to visualize  2. Mid upper arm   Unable to visualize  3. Above AC 2.21mm 9.74mm   4. In [facility] 1.75mm 7.65mm   5. Below AC 1.17mm 1.21mm   6. Mid forearm   Unable to visualize  7. Wrist   Unable to visualize                   05/23/2017 10:05 AM Maudry Mayhew, BS, RVT, RDCS, RDMS

## 2017-05-23 NOTE — Procedures (Signed)
I have personally attended this patient's dialysis session.   HD#2 today for new ESRD 3 300/600 UFG 1L R IJ temp cath no issues Nausea - zofran Labs pending  Jamal Maes, MD [facility] [phone] Pager 05/23/2017, 12:26 PM

## 2017-05-23 NOTE — Progress Notes (Signed)
PROGRESS NOTE    Colin [name]  XVQ:[id number] DOB: June 22, 1965 DOA: 05/21/2017 PCP: Wenda Low, MD     Brief Narrative:  Colin [name] is a 52 y.o. male with medical history significant of HTN, hyperparathyroid bone disease, ETOH abuse, CKD, and AAA presenting with dyspnea with exertion.  He admits to worsening shortness of breath for about a month as well as fatigue with exertion.  He was previously seen admitted in July 2018 for hypertensive crisis and acute renal failure.  He was supposed to follow-up nephrology, but has failed to do so.  He now presents with acute kidney injury with creatinine 21.8.  Chest x-ray also revealed right lower lobe community-acquired pneumonia.  His hemoglobin was low at 6.1, likely due to renal failure.  FOBT was negative.  He was given 2 units packed red blood cells. Nephrology was consulted. IR placed temporary catheter for dialysis initiation.    Assessment & Plan:    Progressive CK D, now ESRD  -Nephrology consulting, status post temporary catheter placement by interventional radiology 3/5 -Started HD -Nephrology following for dialysis -CVS consulted, plan for AV graft tomorrow -CLIP process started  RLL CAP -Rocephin/azithromycin day 2 -Blood cultures negative -Recommend repeat CXR in 3-4 weeks to ensure resolution of consolidation   Anemia of chronic renal disease -FOBT negative, and iron deficiency -S/p 2u pRBC  -Continue iron and Aranesp with hemodialysis  HTN -Continue norvasc, coreg, hydralazine  -Cut down hydralazine dose, titrate down meds as volume status/BP status improves  HLD -Continue zocor   DVT prophylaxis: SCD Code Status: Full Family Communication: No family at bedside Disposition Plan: Pending HD, vascular access, CLIP process    Consultants:   Nephrology  Vascular surgery  Procedures:   Temporary HD catheter placed 3/5   Antimicrobials:  Anti-infectives (From admission, onward)   Start      Dose/Rate Route Frequency Ordered Stop   05/22/17 1700  cefTRIAXone (ROCEPHIN) 1 g in sodium chloride 0.9 % 100 mL IVPB     1 g 200 mL/hr over 30 Minutes Intravenous Every 24 hours 05/22/17 0944     05/22/17 1200  azithromycin (ZITHROMAX) tablet 500 mg     500 mg Oral Daily 05/22/17 0818 05/26/17 1159   05/22/17 0800  cefTRIAXone (ROCEPHIN) 1 g in sodium chloride 0.9 % 100 mL IVPB  Status:  Discontinued     1 g 200 mL/hr over 30 Minutes Intravenous Every 24 hours 05/21/17 2105 05/22/17 0944   05/21/17 1330  cefTRIAXone (ROCEPHIN) 1 g in sodium chloride 0.9 % 100 mL IVPB     1 g 200 mL/hr over 30 Minutes Intravenous  Once 05/21/17 1326 05/21/17 1438   05/21/17 1330  azithromycin (ZITHROMAX) 500 mg in sodium chloride 0.9 % 250 mL IVPB     500 mg 250 mL/hr over 60 Minutes Intravenous  Once 05/21/17 1326 05/21/17 1945       Subjective: Overall feels better, has mild nausea but improving   Temp:      TempSrc:      SpO2:      Weight:      Height:        Intake/Output Summary (Last 24 hours) at 05/23/2017 1450 Last data filed at 05/23/2017 0910 Gross per 24 hour  Intake 320 ml  Output 1200 ml  Net -880 ml   Filed Weights   05/22/17 1744 05/22/17 2146 05/23/17 1143  Weight: 88.3 kg (194 lb 10.7 oz) 88.3 kg (194 lb 10.7 oz) 86.5 kg (  190 lb 11.2 oz)    Examination:  Gen: Awake, Alert, Oriented X 3, no distress HEENT: PERRLA, Neck supple, no JVD Lungs: Rhonchi at right base CVS: RRR,No Gallops,Rubs or new Murmurs Abd: soft, Non tender, non distended, BS present Extremities: Trace edema Right IJ temporary catheter Skin: no new rashes no distress Psychiatry: Judgement and insight appear normal. Mood & affect appropriate.   Data Reviewed: I have personally reviewed following labs and imaging studies  CBC: Recent Labs  Lab 05/21/17 0845 05/21/17 1257 05/22/17 1100 05/23/17 0410  WBC 6.1  --  8.1 8.5  NEUTROABS  --   --  6.4  --   HGB 6.1* 5.4* 7.3* 7.4*  HCT 18.1*  16.0* 22.1* 22.5*  MCV 85.4  --  83.1 84.3  PLT 151  --  180 416   Basic Metabolic Panel: Recent Labs  Lab 05/21/17 0845 05/21/17 1257 05/22/17 1100 05/23/17 1148  NA 131* 132* 133* 136  K 3.8 4.1 4.1 3.7  CL 101 103 103 102  CO2 14*  --  15* 20*  GLUCOSE 105* 94 137* 110*  BUN 115* 108* 113* 71*  CREATININE 21.80* >18.00* 21.37* 16.30*  CALCIUM 8.6*  --  8.9 8.7*  PHOS  --   --  8.6* 6.6*   GFR: Estimated Creatinine Clearance: 5.4 mL/min (A) (by C-G formula based on SCr of 16.3 mg/dL (H)). Liver Function Tests: Recent Labs  Lab 05/22/17 1100 05/23/17 1148  ALBUMIN 2.5* 2.5*   No results for input(s): LIPASE, AMYLASE in the last 168 hours. No results for input(s): AMMONIA in the last 168 hours. Coagulation Profile: No results for input(s): INR, PROTIME in the last 168 hours. Cardiac Enzymes: No results for input(s): CKTOTAL, CKMB, CKMBINDEX, TROPONINI in the last 168 hours. BNP (last 3 results) No results for input(s): PROBNP in the last 8760 hours. HbA1C: No results for input(s): HGBA1C in the last 72 hours. CBG: Recent Labs  Lab 05/21/17 1829 05/22/17 0034  GLUCAP 93 143*   Lipid Profile: No results for input(s): CHOL, HDL, LDLCALC, TRIG, CHOLHDL, LDLDIRECT in the last 72 hours. Thyroid Function Tests: No results for input(s): TSH, T4TOTAL, FREET4, T3FREE, THYROIDAB in the last 72 hours. Anemia Panel: Recent Labs    05/22/17 0800 05/22/17 1100  FERRITIN 736*  --   TIBC  --  228*  IRON  --  20*   Sepsis Labs: No results for input(s): PROCALCITON, LATICACIDVEN in the last 168 hours.  Recent Results (from the past 240 hour(s))  Culture, blood (routine x 2) Call MD if unable to obtain prior to antibiotics being given     Status: None (Preliminary result)   Collection Time: 05/21/17 10:38 PM  Result Value Ref Range Status   Specimen Description BLOOD RIGHT HAND  Final   Special Requests AEROBIC BOTTLE ONLY Blood Culture adequate volume  Final    Culture   Final    NO GROWTH 2 DAYS Performed at [facility], [address], [place], [place] [address]    Report Status PENDING  Incomplete       Radiology Studies: Ir Fluoro Guide Cv Line Right  Result Date: 05/22/2017 CLINICAL DATA:  Renal failure, needs venous access for hemodialysis. EXAM: EXAM RIGHT IJ CATHETER PLACEMENT UNDER ULTRASOUND AND FLUOROSCOPIC GUIDANCE TECHNIQUE: The procedure, risks (including but not limited to bleeding, infection, organ damage, pneumothorax), benefits, and alternatives were explained to the patient. Questions regarding the procedure were encouraged and answered. The patient understands and consents  to the procedure. Patency of the right IJ vein was confirmed with ultrasound with image documentation. An appropriate skin site was determined. Skin site was marked. Region was prepped using maximum barrier technique including cap and mask, sterile gown, sterile gloves, large sterile sheet, and Chlorhexidine as cutaneous antisepsis. The region was infiltrated locally with 1% lidocaine. Under real-time ultrasound guidance, the right IJ vein was accessed with a 21 gauge needle; the needle tip within the vein was confirmed with ultrasound image documentation. The needle exchanged over a 018 guidewire for vascular dilator. The guidewire would not advance centrally. Limited venography demonstrated tortuous stenotic region near the confluence of the right IJ vein and brachiocephalic vein. This was negotiated with an angled Glidewire, and the tract was dilated with serial vascular dilators, which allowed advancement of a 20 cm Mahurkar catheter. This was positioned with the tip at the cavoatrial junction. Spot chest radiograph shows good positioning and no pneumothorax. Catheter was flushed and sutured externally with 0-Prolene sutures. Patient tolerated the procedure well. FLUOROSCOPY TIME:  1.6 minutes; 95  uGym2 DAP COMPLICATIONS: COMPLICATIONS none IMPRESSION: 1.  Technically successful right IJ Mahurkar catheter placement. Electronically Signed   By: Lucrezia Europe M.D.   On: 05/22/2017 09:24   Ir US Guide Vasc Access Right  Result Date: 05/22/2017 CLINICAL DATA:  Renal failure, needs venous access for hemodialysis. EXAM: EXAM RIGHT IJ CATHETER PLACEMENT UNDER ULTRASOUND AND FLUOROSCOPIC GUIDANCE TECHNIQUE: The procedure, risks (including but not limited to bleeding, infection, organ damage, pneumothorax), benefits, and alternatives were explained to the patient. Questions regarding the procedure were encouraged and answered. The patient understands and consents to the procedure. Patency of the right IJ vein was confirmed with ultrasound with image documentation. An appropriate skin site was determined. Skin site was marked. Region was prepped using maximum barrier technique including cap and mask, sterile gown, sterile gloves, large sterile sheet, and Chlorhexidine as cutaneous antisepsis. The region was infiltrated locally with 1% lidocaine. Under real-time ultrasound guidance, the right IJ vein was accessed with a 21 gauge needle; the needle tip within the vein was confirmed with ultrasound image documentation. The needle exchanged over a 018 guidewire for vascular dilator. The guidewire would not advance centrally. Limited venography demonstrated tortuous stenotic region near the confluence of the right IJ vein and brachiocephalic vein. This was negotiated with an angled Glidewire, and the tract was dilated with serial vascular dilators, which allowed advancement of a 20 cm Mahurkar catheter. This was positioned with the tip at the cavoatrial junction. Spot chest radiograph shows good positioning and no pneumothorax. Catheter was flushed and sutured externally with 0-Prolene sutures. Patient tolerated the procedure well. FLUOROSCOPY TIME:  1.6 minutes; 95  uGym2 DAP COMPLICATIONS: COMPLICATIONS none IMPRESSION: 1. Technically successful right IJ Mahurkar catheter  placement. Electronically Signed   By: Lucrezia Europe M.D.   On: 05/22/2017 09:24      Scheduled Meds: . amLODipine  10 mg Oral Daily  . aspirin EC  81 mg Oral Daily  . azithromycin  500 mg Oral Daily  . calcitRIOL  0.5 mcg Oral Daily  . carvedilol  25 mg Oral BID WC  . darbepoetin (ARANESP) injection - DIALYSIS  200 mcg Intravenous Q Tue-HD  . hydrALAZINE  75 mg Oral Q8H  . sevelamer carbonate  1,600 mg Oral TID WC  . simvastatin  20 mg Oral q1800  . sodium chloride flush  10-40 mL Intracatheter Q12H   Continuous Infusions: . cefTRIAXone (ROCEPHIN)  IV Stopped (05/22/17 1851)  .  ferric gluconate (FERRLECIT/NULECIT) IV       LOS: 2 days    Time spent: 35 minutes   Domenic Polite, MD [facility] [email or web address] Password [email or web address] 05/23/2017, 2:50 PM

## 2017-05-23 NOTE — Progress Notes (Signed)
[facility] Rounding Note  Subjective/Interval History:  Had HD#1 yesterday, getting HD#2 today C/o some nausea in HD Appreciate Dr. Nicole Cella input - for permanent access tomorrow  Objective Vital signs in last 24 hours: Vitals:   05/22/17 1744 05/22/17 2146 05/23/17 0420 05/23/17 0909  BP: (!) 176/95 (!) 169/86 (!) 184/91 (!) 169/92  Pulse: 93 88 98 100  Resp: (!) 21 20 20 18   Temp: 98.2 F (36.8 C) 98.8 F (37.1 C) 98.7 F (37.1 C) 99.1 F (37.3 C)  TempSrc: Oral Oral Oral Oral  SpO2: 98% 97% 96% 97%  Weight: 88.3 kg (194 lb 10.7 oz) 88.3 kg (194 lb 10.7 oz)    Height:       Weight change: 8.553 kg (18 lb 13.7 oz)  Intake/Output Summary (Last 24 hours) at 05/23/2017 1210 Last data filed at 05/23/2017 0910 Gross per 24 hour  Intake 560 ml  Output 1450 ml  Net -890 ml   Physical Exam:  Blood pressure (!) 169/92, pulse 100, temperature 99.1 F (37.3 C), temperature source Oral, resp. rate 18, height 5\' 10"  (1.778 m), weight 88.3 kg (194 lb 10.7 oz), SpO2 97 %.  Fairly well appearing has some nausea In HD Chest ant clear S1S2 No S3 or pericardial rub Abdomen soft, no focal tenderness Trace LE edema Neuro: alert, Ox3, no focal deficit R IJ temp HD cath in use (IR 3/5)   Recent Labs  Lab 05/21/17 0845 05/21/17 1257 05/22/17 1100  NA 131* 132* 133*  K 3.8 4.1 4.1  CL 101 103 103  CO2 14*  --  15*  GLUCOSE 105* 94 137*  BUN 115* 108* 113*  CREATININE 21.80* >18.00* 21.37*  CALCIUM 8.6*  --  8.9  PHOS  --   --  8.6*    Recent Labs  Lab 05/21/17 0845 05/21/17 1257 05/22/17 1100 05/23/17 0410  WBC 6.1  --  8.1 8.5  NEUTROABS  --   --  6.4  --   HGB 6.1* 5.4* 7.3* 7.4*  HCT 18.1* 16.0* 22.1* 22.5*  MCV 85.4  --  83.1 84.3  PLT 151  --  180 157   PTH    Component Value Date/Time   PTH 800 (H) 05/22/2017 1100   Iron/TIBC/Ferritin/ %Sat    Component Value Date/Time   IRON 20 (L) 05/22/2017 1100   TIBC 228 (L) 05/22/2017 1100   FERRITIN 736 (H) 05/22/2017  0800   IRONPCTSAT 9 (L) 05/22/2017 1100   Recent Labs  Lab 05/21/17 1829 05/22/17 0034  GLUCAP 93 143*   Studies/Results: Ir Fluoro Guide Cv Line Right  Result Date: 05/22/2017 CLINICAL DATA:  Renal failure, needs venous access for hemodialysis. EXAM: EXAM RIGHT IJ CATHETER PLACEMENT UNDER ULTRASOUND AND FLUOROSCOPIC GUIDANCE TECHNIQUE: The procedure, risks (including but not limited to bleeding, infection, organ damage, pneumothorax), benefits, and alternatives were explained to the patient. Questions regarding the procedure were encouraged and answered. The patient understands and consents to the procedure. Patency of the right IJ vein was confirmed with ultrasound with image documentation. An appropriate skin site was determined. Skin site was marked. Region was prepped using maximum barrier technique including cap and mask, sterile gown, sterile gloves, large sterile sheet, and Chlorhexidine as cutaneous antisepsis. The region was infiltrated locally with 1% lidocaine. Under real-time ultrasound guidance, the right IJ vein was accessed with a 21 gauge needle; the needle tip within the vein was confirmed with ultrasound image documentation. The needle exchanged over a 018 guidewire for vascular dilator.  The guidewire would not advance centrally. Limited venography demonstrated tortuous stenotic region near the confluence of the right IJ vein and brachiocephalic vein. This was negotiated with an angled Glidewire, and the tract was dilated with serial vascular dilators, which allowed advancement of a 20 cm Mahurkar catheter. This was positioned with the tip at the cavoatrial junction. Spot chest radiograph shows good positioning and no pneumothorax. Catheter was flushed and sutured externally with 0-Prolene sutures. Patient tolerated the procedure well. FLUOROSCOPY TIME:  1.6 minutes; 95  uGym2 DAP COMPLICATIONS: COMPLICATIONS none IMPRESSION: 1. Technically successful right IJ Mahurkar catheter  placement. Electronically Signed   By: Lucrezia Europe M.D.   On: 05/22/2017 09:24   Ir US Guide Vasc Access Right  Result Date: 05/22/2017 CLINICAL DATA:  Renal failure, needs venous access for hemodialysis. EXAM: EXAM RIGHT IJ CATHETER PLACEMENT UNDER ULTRASOUND AND FLUOROSCOPIC GUIDANCE TECHNIQUE: The procedure, risks (including but not limited to bleeding, infection, organ damage, pneumothorax), benefits, and alternatives were explained to the patient. Questions regarding the procedure were encouraged and answered. The patient understands and consents to the procedure. Patency of the right IJ vein was confirmed with ultrasound with image documentation. An appropriate skin site was determined. Skin site was marked. Region was prepped using maximum barrier technique including cap and mask, sterile gown, sterile gloves, large sterile sheet, and Chlorhexidine as cutaneous antisepsis. The region was infiltrated locally with 1% lidocaine. Under real-time ultrasound guidance, the right IJ vein was accessed with a 21 gauge needle; the needle tip within the vein was confirmed with ultrasound image documentation. The needle exchanged over a 018 guidewire for vascular dilator. The guidewire would not advance centrally. Limited venography demonstrated tortuous stenotic region near the confluence of the right IJ vein and brachiocephalic vein. This was negotiated with an angled Glidewire, and the tract was dilated with serial vascular dilators, which allowed advancement of a 20 cm Mahurkar catheter. This was positioned with the tip at the cavoatrial junction. Spot chest radiograph shows good positioning and no pneumothorax. Catheter was flushed and sutured externally with 0-Prolene sutures. Patient tolerated the procedure well. FLUOROSCOPY TIME:  1.6 minutes; 95  uGym2 DAP COMPLICATIONS: COMPLICATIONS none IMPRESSION: 1. Technically successful right IJ Mahurkar catheter placement. Electronically Signed   By: Lucrezia Europe M.D.    On: 05/22/2017 09:24   Medications: . cefTRIAXone (ROCEPHIN)  IV Stopped (05/22/17 1851)   . amLODipine  10 mg Oral Daily  . aspirin EC  81 mg Oral Daily  . azithromycin  500 mg Oral Daily  . carvedilol  25 mg Oral BID WC  . darbepoetin (ARANESP) injection - DIALYSIS  200 mcg Intravenous Q Tue-HD  . hydrALAZINE  75 mg Oral Q8H  . simvastatin  20 mg Oral q1800  . sodium chloride flush  10-40 mL Intracatheter Q12H    Background:  PMH HTN, medical non-compliance, progressive CKD with failure to f/u at [facility]. Last hosp 09/2016 w/creatinine 5. Was to have followed up with Dr. Justin Mend 10/17/16. Lost to F/U. Comes to ED with uremic symptoms, Hb 6, creatinine 22 - new ESRD. PNA on CXR as well as edema.   Assessment/Recommendations  1. New ESRD - temp cath placed 3/5 IR (because of PNA) then plan to get tunneled after ATB's done. Started CLIP process.  2. Permanent access - Appreciate Dr. Nicole Cella input. Plans to tunnel HD cath and place AVF or AVG tomorrow 3/7. Plan HD#3 after. 3. Secondary HPT - PTH 800. Adding calcitriol 0.5 QD until HD outpt unit  and schedule known, and sevelamer 800 2 with meals. .  4. Anemia - ESRD + Fe def. Transfused 2 units 3/4. Started Aranesp 200 on 3.5. Ferrlecit QDX8 to start today. 5. HTN - meds 6. PNA - R basilar opacity on CXR. ATB's per primary team (rocephin).  Jamal Maes, MD [facility] [phone] pager 05/23/2017, 12:10 PM

## 2017-05-23 NOTE — Progress Notes (Signed)
VASCULAR SURGERY:  His vein map is still pending. He is scheduled for a left AV fistula or AV graft tomorrow and placement of a tunneled dialysis catheter. I have written his preop orders.  [name], MD, [place] [phone] Office: [phone]

## 2017-05-23 NOTE — H&P (View-Only) (Signed)
VASCULAR SURGERY:  His vein map is still pending. He is scheduled for a left AV fistula or AV graft tomorrow and placement of a tunneled dialysis catheter. I have written his preop orders.  [name], MD, [place] [phone] Office: [phone]

## 2017-05-23 NOTE — Anesthesia Preprocedure Evaluation (Addendum)
Anesthesia Evaluation  Patient identified by MRN, date of birth, ID band Patient awake    Reviewed: Allergy & Precautions, H&P , Patient's Chart, lab work & pertinent test results, reviewed documented beta blocker date and time   Airway Mallampati: II  TM Distance: >3 FB Neck ROM: full    Dental no notable dental hx.    Pulmonary shortness of breath, pneumonia,    Pulmonary exam normal breath sounds clear to auscultation       Cardiovascular hypertension, + Peripheral Vascular Disease   Rhythm:regular Rate:Normal     Neuro/Psych    GI/Hepatic negative GI ROS, Neg liver ROS,   Endo/Other    Renal/GU Renal disease     Musculoskeletal   Abdominal   Peds  Hematology  (+) anemia ,   Anesthesia Other Findings   Reproductive/Obstetrics                            Anesthesia Physical Anesthesia Plan  ASA: III  Anesthesia Plan: MAC   Post-op Pain Management:    Induction: Intravenous  PONV Risk Score and Plan: 2 and Dexamethasone, Ondansetron and Treatment may vary due to age or medical condition  Airway Management Planned: Mask, Natural Airway and Simple Face Mask  Additional Equipment:   Intra-op Plan:   Post-operative Plan:   Informed Consent: I have reviewed the patients History and Physical, chart, labs and discussed the procedure including the risks, benefits and alternatives for the proposed anesthesia with the patient or authorized representative who has indicated his/her understanding and acceptance.   Dental Advisory Given  Plan Discussed with: CRNA and Surgeon  Anesthesia Plan Comments:        Anesthesia Quick Evaluation

## 2017-05-24 ENCOUNTER — Encounter (HOSPITAL_COMMUNITY)
Admission: EM | Disposition: A | Discharge status: Home / Self Care | Payer: Self-pay | Source: Home / Self Care | Attending: Internal Medicine

## 2017-05-24 ENCOUNTER — Inpatient Hospital Stay (HOSPITAL_COMMUNITY): Payer: Managed Care, Other (non HMO) | Admitting: Anesthesiology

## 2017-05-24 ENCOUNTER — Inpatient Hospital Stay (HOSPITAL_COMMUNITY): Payer: Managed Care, Other (non HMO)

## 2017-05-24 DIAGNOSIS — I10 Essential (primary) hypertension: Secondary | ICD-10-CM

## 2017-05-24 DIAGNOSIS — D696 Thrombocytopenia, unspecified: Secondary | ICD-10-CM

## 2017-05-24 DIAGNOSIS — J181 Lobar pneumonia, unspecified organism: Secondary | ICD-10-CM

## 2017-05-24 HISTORY — PX: AV FISTULA PLACEMENT: SHX1204

## 2017-05-24 HISTORY — PX: INSERTION OF DIALYSIS CATHETER: SHX1324

## 2017-05-24 LAB — CBC
HCT: 21.7 % — ABNORMAL LOW (ref 39.0–52.0)
Hemoglobin: 6.9 g/dL — CL (ref 13.0–17.0)
MCH: 27.7 pg (ref 26.0–34.0)
MCHC: 31.8 g/dL (ref 30.0–36.0)
MCV: 87.1 fL (ref 78.0–100.0)
PLATELETS: 140 10*3/uL — AB (ref 150–400)
RBC: 2.49 MIL/uL — ABNORMAL LOW (ref 4.22–5.81)
RDW: 15 % (ref 11.5–15.5)
WBC: 8 10*3/uL (ref 4.0–10.5)

## 2017-05-24 LAB — BASIC METABOLIC PANEL
ANION GAP: 11 (ref 5–15)
BUN: 37 mg/dL — AB (ref 6–20)
CALCIUM: 8.2 mg/dL — AB (ref 8.9–10.3)
CO2: 25 mmol/L (ref 22–32)
Chloride: 101 mmol/L (ref 101–111)
Creatinine, Ser: 11.06 mg/dL — ABNORMAL HIGH (ref 0.61–1.24)
GFR calc Af Amer: 5 mL/min — ABNORMAL LOW (ref >60)
GFR calc non Af Amer: 5 mL/min — ABNORMAL LOW (ref >60)
GLUCOSE: 99 mg/dL (ref 65–99)
Potassium: 3.5 mmol/L (ref 3.5–5.1)
SODIUM: 137 mmol/L (ref 135–145)

## 2017-05-24 LAB — SURGICAL PCR SCREEN
MRSA, PCR: NEGATIVE
Staphylococcus aureus: NEGATIVE

## 2017-05-24 LAB — PREPARE RBC (CROSSMATCH)

## 2017-05-24 SURGERY — ARTERIOVENOUS (AV) FISTULA CREATION
Anesthesia: Monitor Anesthesia Care | Site: Arm Upper | Laterality: Right

## 2017-05-24 MED ORDER — LIDOCAINE HCL (CARDIAC) 20 MG/ML IV SOLN
INTRAVENOUS | Status: DC | PRN
  Administered 2017-05-24: 100 mg via INTRATRACHEAL

## 2017-05-24 MED ORDER — FENTANYL CITRATE (PF) 100 MCG/2ML IJ SOLN
INTRAMUSCULAR | Status: DC | PRN
  Administered 2017-05-24 (×2): 25 ug via INTRAVENOUS

## 2017-05-24 MED ORDER — PROTAMINE SULFATE 10 MG/ML IV SOLN
INTRAVENOUS | Status: DC | PRN
  Administered 2017-05-24: 40 mg via INTRAVENOUS

## 2017-05-24 MED ORDER — HEPARIN SODIUM (PORCINE) 1000 UNIT/ML IJ SOLN
INTRAMUSCULAR | Status: DC | PRN
  Administered 2017-05-24: 2000 [IU]

## 2017-05-24 MED ORDER — PHENYLEPHRINE HCL 10 MG/ML IJ SOLN
INTRAMUSCULAR | Status: DC | PRN
  Administered 2017-05-24: 120 ug via INTRAVENOUS

## 2017-05-24 MED ORDER — FENTANYL CITRATE (PF) 250 MCG/5ML IJ SOLN
INTRAMUSCULAR | Status: AC
  Filled 2017-05-24: qty 5

## 2017-05-24 MED ORDER — ONDANSETRON HCL 4 MG/2ML IJ SOLN
INTRAMUSCULAR | Status: DC | PRN
  Administered 2017-05-24: 4 mg via INTRAVENOUS

## 2017-05-24 MED ORDER — LIDOCAINE-EPINEPHRINE (PF) 1 %-1:200000 IJ SOLN
INTRAMUSCULAR | Status: DC | PRN
Start: 2017-05-24 — End: 2017-05-24
  Administered 2017-05-24: 10 mL

## 2017-05-24 MED ORDER — DEXAMETHASONE SODIUM PHOSPHATE 10 MG/ML IJ SOLN
INTRAMUSCULAR | Status: AC
  Filled 2017-05-24: qty 1

## 2017-05-24 MED ORDER — 0.9 % SODIUM CHLORIDE (POUR BTL) OPTIME
TOPICAL | Status: DC | PRN
  Administered 2017-05-24: 1000 mL

## 2017-05-24 MED ORDER — SODIUM CHLORIDE 0.9 % IV SOLN: Freq: Once | INTRAVENOUS | Status: DC

## 2017-05-24 MED ORDER — LIDOCAINE 2% (20 MG/ML) 5 ML SYRINGE
INTRAMUSCULAR | Status: AC
  Filled 2017-05-24: qty 5

## 2017-05-24 MED ORDER — LIDOCAINE HCL (PF) 1 % IJ SOLN
INTRAMUSCULAR | Status: AC
  Filled 2017-05-24: qty 30

## 2017-05-24 MED ORDER — DEXAMETHASONE SODIUM PHOSPHATE 10 MG/ML IJ SOLN
INTRAMUSCULAR | Status: DC | PRN
  Administered 2017-05-24: 10 mg via INTRAVENOUS

## 2017-05-24 MED ORDER — DARBEPOETIN ALFA 200 MCG/0.4ML IJ SOSY
PREFILLED_SYRINGE | INTRAMUSCULAR | Status: AC
  Filled 2017-05-24: qty 0.4

## 2017-05-24 MED ORDER — DOXERCALCIFEROL 4 MCG/2ML IV SOLN
3.0000 ug | INTRAVENOUS | Status: DC
  Administered 2017-05-25: 3 ug via INTRAVENOUS
  Filled 2017-05-24: qty 2

## 2017-05-24 MED ORDER — SODIUM CHLORIDE 0.9 % IV SOLN
INTRAVENOUS | Status: DC | PRN
  Administered 2017-05-24 (×2): via INTRAVENOUS

## 2017-05-24 MED ORDER — HEPARIN SODIUM (PORCINE) 5000 UNIT/ML IJ SOLN
INTRAMUSCULAR | Status: DC | PRN
  Administered 2017-05-24: 500 mL

## 2017-05-24 MED ORDER — MIDAZOLAM HCL 5 MG/5ML IJ SOLN
INTRAMUSCULAR | Status: DC | PRN
  Administered 2017-05-24: 2 mg via INTRAVENOUS

## 2017-05-24 MED ORDER — LIDOCAINE-EPINEPHRINE 1 %-1:100000 IJ SOLN
INTRAMUSCULAR | Status: AC
  Filled 2017-05-24: qty 1

## 2017-05-24 MED ORDER — PROPOFOL 10 MG/ML IV BOLUS
INTRAVENOUS | Status: AC
  Filled 2017-05-24: qty 20

## 2017-05-24 MED ORDER — LIDOCAINE HCL (PF) 1 % IJ SOLN
INTRAMUSCULAR | Status: DC | PRN
  Administered 2017-05-24: 5 mL

## 2017-05-24 MED ORDER — ROCURONIUM BROMIDE 10 MG/ML (PF) SYRINGE
PREFILLED_SYRINGE | INTRAVENOUS | Status: AC
  Filled 2017-05-24: qty 5

## 2017-05-24 MED ORDER — HEPARIN SODIUM (PORCINE) 1000 UNIT/ML IJ SOLN
INTRAMUSCULAR | Status: AC
  Filled 2017-05-24: qty 1

## 2017-05-24 MED ORDER — ONDANSETRON HCL 4 MG/2ML IJ SOLN
INTRAMUSCULAR | Status: AC
  Filled 2017-05-24: qty 2

## 2017-05-24 MED ORDER — HEPARIN SODIUM (PORCINE) 1000 UNIT/ML IJ SOLN
INTRAMUSCULAR | Status: DC | PRN
Start: 2017-05-24 — End: 2017-05-24
  Administered 2017-05-24: 7000 [IU] via INTRAVENOUS

## 2017-05-24 MED ORDER — MIDAZOLAM HCL 2 MG/2ML IJ SOLN
INTRAMUSCULAR | Status: AC
  Filled 2017-05-24: qty 2

## 2017-05-24 MED ORDER — OXYCODONE HCL 5 MG PO TABS: 5.0000 mg | ORAL_TABLET | ORAL | Status: DC | PRN

## 2017-05-24 MED ORDER — THROMBIN (RECOMBINANT) 20000 UNITS EX SOLR
CUTANEOUS | Status: AC
  Filled 2017-05-24: qty 20000

## 2017-05-24 MED ORDER — FENTANYL CITRATE (PF) 100 MCG/2ML IJ SOLN: 25.0000 ug | INTRAMUSCULAR | Status: DC | PRN

## 2017-05-24 MED ORDER — PROPOFOL 10 MG/ML IV BOLUS
INTRAVENOUS | Status: DC | PRN
  Administered 2017-05-24: 30 mg via INTRAVENOUS

## 2017-05-24 MED ORDER — PROPOFOL 500 MG/50ML IV EMUL
INTRAVENOUS | Status: DC | PRN
  Administered 2017-05-24: 09:00:00 via INTRAVENOUS
  Administered 2017-05-24: 125 ug/kg/min via INTRAVENOUS

## 2017-05-24 SURGICAL SUPPLY — 60 items
ARMBAND PINK RESTRICT EXTREMIT (MISCELLANEOUS) ×8 IMPLANT
BAG DECANTER FOR FLEXI CONT (MISCELLANEOUS) ×4 IMPLANT
BIOPATCH RED 1 DISK 7.0 (GAUZE/BANDAGES/DRESSINGS) ×3 IMPLANT
BIOPATCH RED 1IN DISK 7.0MM (GAUZE/BANDAGES/DRESSINGS) ×1
CANISTER SUCT 3000ML PPV (MISCELLANEOUS) ×4 IMPLANT
CANNULA VESSEL 3MM 2 BLNT TIP (CANNULA) ×4 IMPLANT
CATH PALINDROME RT-P 15FX19CM (CATHETERS) IMPLANT
CATH PALINDROME RT-P 15FX23CM (CATHETERS) ×4 IMPLANT
CATH PALINDROME RT-P 15FX28CM (CATHETERS) IMPLANT
CATH PALINDROME RT-P 15FX55CM (CATHETERS) IMPLANT
CHLORAPREP W/TINT 26ML (MISCELLANEOUS) ×4 IMPLANT
CLIP TI WIDE RED SMALL 6 (CLIP) ×4 IMPLANT
CLIP VESOCCLUDE MED 6/CT (CLIP) ×4 IMPLANT
CLIP VESOCCLUDE SM WIDE 6/CT (CLIP) ×4 IMPLANT
COVER PROBE W GEL 5X96 (DRAPES) ×4 IMPLANT
COVER SURGICAL LIGHT HANDLE (MISCELLANEOUS) ×4 IMPLANT
DECANTER SPIKE VIAL GLASS SM (MISCELLANEOUS) ×4 IMPLANT
DERMABOND ADHESIVE PROPEN (GAUZE/BANDAGES/DRESSINGS) ×4
DERMABOND ADVANCED (GAUZE/BANDAGES/DRESSINGS) ×2
DERMABOND ADVANCED .7 DNX12 (GAUZE/BANDAGES/DRESSINGS) ×2 IMPLANT
DERMABOND ADVANCED .7 DNX6 (GAUZE/BANDAGES/DRESSINGS) ×4 IMPLANT
DRAPE C-ARM 42X72 X-RAY (DRAPES) ×4 IMPLANT
DRAPE CHEST BREAST 15X10 FENES (DRAPES) ×4 IMPLANT
ELECT REM PT RETURN 9FT ADLT (ELECTROSURGICAL) ×4
ELECTRODE REM PT RTRN 9FT ADLT (ELECTROSURGICAL) ×2 IMPLANT
GAUZE SPONGE 4X4 16PLY XRAY LF (GAUZE/BANDAGES/DRESSINGS) ×8 IMPLANT
GLOVE BIO SURGEON STRL SZ 6.5 (GLOVE) ×3 IMPLANT
GLOVE BIO SURGEON STRL SZ7.5 (GLOVE) ×4 IMPLANT
GLOVE BIO SURGEONS STRL SZ 6.5 (GLOVE) ×1
GLOVE BIOGEL PI IND STRL 6.5 (GLOVE) ×2 IMPLANT
GLOVE BIOGEL PI IND STRL 8 (GLOVE) ×2 IMPLANT
GLOVE BIOGEL PI INDICATOR 6.5 (GLOVE) ×2
GLOVE BIOGEL PI INDICATOR 8 (GLOVE) ×2
GOWN STRL REUS W/ TWL LRG LVL3 (GOWN DISPOSABLE) ×6 IMPLANT
GOWN STRL REUS W/TWL LRG LVL3 (GOWN DISPOSABLE) ×6
KIT BASIN OR (CUSTOM PROCEDURE TRAY) ×4 IMPLANT
KIT ROOM TURNOVER OR (KITS) ×4 IMPLANT
NEEDLE 18GX1X1/2 (RX/OR ONLY) (NEEDLE) ×4 IMPLANT
NEEDLE HYPO 25GX1X1/2 BEV (NEEDLE) ×4 IMPLANT
NS IRRIG 1000ML POUR BTL (IV SOLUTION) ×4 IMPLANT
PACK CV ACCESS (CUSTOM PROCEDURE TRAY) ×4 IMPLANT
PACK SURGICAL SETUP 50X90 (CUSTOM PROCEDURE TRAY) ×4 IMPLANT
PAD ARMBOARD 7.5X6 YLW CONV (MISCELLANEOUS) ×8 IMPLANT
SPONGE SURGIFOAM ABS GEL 100 (HEMOSTASIS) IMPLANT
SUT ETHILON 3 0 PS 1 (SUTURE) ×4 IMPLANT
SUT PROLENE 6 0 BV (SUTURE) ×4 IMPLANT
SUT SILK 3 0 TIES 10X30 (SUTURE) ×4 IMPLANT
SUT VIC AB 3-0 SH 27 (SUTURE) ×2
SUT VIC AB 3-0 SH 27X BRD (SUTURE) ×2 IMPLANT
SUT VIC AB 4-0 PS2 18 (SUTURE) ×4 IMPLANT
SUT VICRYL 4-0 PS2 18IN ABS (SUTURE) ×4 IMPLANT
SYR 10ML LL (SYRINGE) ×4 IMPLANT
SYR 20CC LL (SYRINGE) ×8 IMPLANT
SYR 5ML LL (SYRINGE) ×8 IMPLANT
SYR CONTROL 10ML LL (SYRINGE) ×4 IMPLANT
TAPE CLOTH SURG 4X10 WHT LF (GAUZE/BANDAGES/DRESSINGS) ×4 IMPLANT
TOWEL GREEN STERILE (TOWEL DISPOSABLE) ×8 IMPLANT
TOWEL GREEN STERILE FF (TOWEL DISPOSABLE) ×4 IMPLANT
UNDERPAD 30X30 (UNDERPADS AND DIAPERS) ×4 IMPLANT
WATER STERILE IRR 1000ML POUR (IV SOLUTION) ×4 IMPLANT

## 2017-05-24 NOTE — Anesthesia Procedure Notes (Signed)
Procedure Name: MAC Date/Time: 05/24/2017 7:47 AM Performed by: Lance Coon, CRNA Pre-anesthesia Checklist: Patient identified, Emergency Drugs available, Suction available, Patient being monitored and Timeout performed Patient Re-evaluated:Patient Re-evaluated prior to induction Oxygen Delivery Method: Nasal cannula

## 2017-05-24 NOTE — Progress Notes (Signed)
HD tx initiated via HD cath w/o problem, pull/push/flush equally w/o problem, VSS, will cont to monitor while on HD tx 

## 2017-05-24 NOTE — Op Note (Signed)
NAME: Colin [name]    MRN: [id number] DOB: [birth year]    DATE OF OPERATION: 05/24/2017  PREOP DIAGNOSIS:    Stage V chronic kidney disease  POSTOP DIAGNOSIS:    Same  PROCEDURE:    1.  Removal of right IJ temporary dialysis catheter 2.  Ultrasound-guided placement of right IJ 23 cm tunneled dialysis catheter 3.  Right brachiocephalic AV fistula  SURGEON: Judeth Cornfield. Scot Dock, MD, FACS  ASSIST: Gerri Lins PA  ANESTHESIA: Local with sedation  EBL: Minimal  INDICATIONS:    Colin [name] is a 52 y.o. male who presents for new access.  It appeared that his best option for a fistula was his upper arm cephalic vein on the right.  FINDINGS:   The cephalic vein was 3 mm in size.  It was fairly far lateral necessitating separate incisions over the cephalic vein to allow adequate length for anastomosis to the brachial artery  TECHNIQUE:   The patient was taken to the operating room and sedated by anesthesia.  Based on the location of the temporary catheter I did not think this could be changed over a wire as it was very high in the neck.  I therefore remove the catheter and held pressure.  The neck was then prepped and draped in usual sterile fashion.  Under ultrasound guidance, after the skin was anesthetized, the right IJ was cannulated and a guidewire introduced into the superior vena cava under fluoroscopic control.  The tract over the wire was dilated and then the dilator and peel-away sheath were advanced over the wire and the wire and dilator removed.  The catheter was passed through the peel-away sheath and positioned in the right atrium.  The exit site for the catheter was selected and the skin anesthetized between the 2 areas.  The catheter was brought through the tunnel cut to the appropriate length and the distal ports were attached.  Both ports withdrew easily with and flushed with heparinized saline and filled with concentrated heparin.  The  catheter was secured at its exit site with a 3-0 nylon suture.  The IJ cannulation site was closed with a 4-0 subcuticular stitch.  Sterile dressing was applied.  Attention was then turned to the right arm.  I looked at the cephalic vein with the SonoSite and it appeared reasonable in size although not especially large.  The vein was quite far lateral.  After the skin was anesthetized a longitudinal incision was made over the cephalic vein on the lateral aspect of the upper arm and the vein here was dissected free.  I then made a separate longitudinal incision over the cephalic vein below the antecubital crease and the vein here was dissected free and mobilized.  Branches were divided between clips and 3-0 silk ties.  After the skin was anesthetized, a separate longitudinal incision was made over the brachial artery.  This was controlled with a vessel loop.  A tunnel was created between the 2 incisions.  The patient was heparinized.  The cephalic vein was ligated distally and irrigated up with heparinized saline.  It was a 3 mm vein.  It was brought to the tunnel and marked to prevent twisting.  The brachial artery was clamped proximally and distally and a transverse arteriotomy was made.  The vein was spatulated and sewn end to side to the brachial artery using continuous 6-0 Prolene suture.  At the completion was a good thrill in the fistula and a palpable radial pulse.  The heparin was partially reversed with protamine.  The wounds were each closed with a deep layer of 3-0 Vicryl the skin closed with 4-0 Vicryl.  Dermabond was applied.  The patient tolerated the procedure well was transferred to the recovery room in stable condition.  All needle and sponge counts were correct.  [name], MD, FACS [facility]  DATE OF DICTATION:   05/24/2017

## 2017-05-24 NOTE — Transfer of Care (Signed)
Immediate Anesthesia Transfer of Care Note  Patient: Colin [name]  Procedure(s) Performed: ARTERIOVENOUS (AV) FISTULA CREATION (Right Arm Upper) INSERTION OF DIALYSIS CATHETER (N/A )  Patient Location: PACU  Anesthesia Type:MAC  Level of Consciousness: awake and patient cooperative  Airway & Oxygen Therapy: Patient Spontanous Breathing and Patient connected to face mask oxygen  Post-op Assessment: Report given to RN and Post -op Vital signs reviewed and stable  Post vital signs: Reviewed and stable  Last Vitals:  Vitals:   05/23/17 2038 05/24/17 0536  BP: (!) 152/86 (!) 154/80  Pulse: 86 99  Resp: 16 16  Temp: 37.3 C 37.2 C  SpO2: 96% 99%    Last Pain:  Vitals:   05/24/17 0536  TempSrc: Oral  PainSc:          Complications: No apparent anesthesia complications

## 2017-05-24 NOTE — Interval H&P Note (Signed)
History and Physical Interval Note:  05/24/2017 7:04 AM  Colin [name] E Andrews-Commey  has presented today for surgery, with the diagnosis of end stage renal disease  The various methods of treatment have been discussed with the patient and family. After consideration of risks, benefits and other options for treatment, the patient has consented to  Procedure(s): ARTERIOVENOUS (AV) FISTULA CREATION VERSUSE GRAFT INSERTION LEFT ARM (Right) INSERTION OF DIALYSIS CATHETER (N/A) as a surgical intervention .  The patient's history has been reviewed, patient examined, no change in status, stable for surgery.  I have reviewed the patient's chart and labs.  Questions were answered to the patient's satisfaction.     [name]

## 2017-05-24 NOTE — Progress Notes (Addendum)
[facility] Rounding Note  Subjective/Interval History:  Has had HD#2 S/p TDC and R BC AVF today Dr. Meriam Sprague feels fine Worried about his work Has MWF 2nd shift spot at [facility] and can start there on Monday  Objective Vital signs in last 24 hours: Vitals:   05/24/17 1045 05/24/17 1100 05/24/17 1115 05/24/17 1131  BP: (!) 143/81 (!) 150/90 (!) 151/96 (!) 151/91  Pulse: 78 74 73 76  Resp: 13 12 14 15   Temp:   98 F (36.7 C) 98.1 F (36.7 C)  TempSrc:    Oral  SpO2: 98% 98% 98% 96%  Weight:      Height:       Weight change: -3.7 kg (-2.5 oz)  Intake/Output Summary (Last 24 hours) at 05/24/2017 1225 Last data filed at 05/24/2017 1000 Gross per 24 hour  Intake 710 ml  Output 1170 ml  Net -460 ml   Physical Exam:  Blood pressure (!) 151/91, pulse 76, temperature 98.1 F (36.7 C), temperature source Oral, resp. rate 15, height 5\' 10"  (1.778 m), weight 83.1 kg (183 lb 3.2 oz), SpO2 96 %.   Looks well, eating some lunch Chest ant clear S1S2 No S3 or pericardial rub Abdomen soft, no focal tenderness No edema LE's now R BC AVF (3/6 Dr. Scot Dock) R IJ [facility] (3/6)  Recent Labs  Lab 05/21/17 0845 05/21/17 1257 05/22/17 1100 05/23/17 1148 05/24/17 0414  NA 131* 132* 133* 136 137  K 3.8 4.1 4.1 3.7 3.5  CL 101 103 103 102 101  CO2 14*  --  15* 20* 25  GLUCOSE 105* 94 137* 110* 99  BUN 115* 108* 113* 71* 37*  CREATININE 21.80* >18.00* 21.37* 16.30* 11.06*  CALCIUM 8.6*  --  8.9 8.7* 8.2*  PHOS  --   --  8.6* 6.6*  --     Recent Labs  Lab 05/21/17 0845 05/21/17 1257 05/22/17 1100 05/23/17 0410 05/24/17 0414  WBC 6.1  --  8.1 8.5 8.0  NEUTROABS  --   --  6.4  --   --   HGB 6.1* 5.4* 7.3* 7.4* 6.9*  HCT 18.1* 16.0* 22.1* 22.5* 21.7*  MCV 85.4  --  83.1 84.3 87.1  PLT 151  --  180 157 140*   PTH    Component Value Date/Time   PTH 800 (H) 05/22/2017 1100   Iron/TIBC/Ferritin/ %Sat    Component Value Date/Time   IRON 20 (L) 05/22/2017 1100   TIBC 228 (L)  05/22/2017 1100   FERRITIN 736 (H) 05/22/2017 0800   IRONPCTSAT 9 (L) 05/22/2017 1100   Recent Labs  Lab 05/21/17 1829 05/22/17 0034  GLUCAP 93 143*   Studies/Results: Dg Chest Port 1 View  Result Date: 05/24/2017 CLINICAL DATA:  Dialysis catheter placement. EXAM: PORTABLE CHEST 1 VIEW COMPARISON:  05/21/2017 FINDINGS: Right dialysis catheter tip in the upper right atrium. No pneumothorax. Cardiomegaly. Prior median sternotomy. Focal airspace opacity in the right lower lobe concerning for pneumonia. Small right effusion. Findings similar to prior study. No focal opacity on the left. IMPRESSION: Right dialysis catheter placement with the tip in the upper right atrium. No pneumothorax. Continued right lower lobe pneumonia with small right effusion. Cardiomegaly. Electronically Signed   By: Rolm Baptise M.D.   On: 05/24/2017 11:20   Dg Fluoro Guide Cv Line-no Report  Result Date: 05/24/2017 Fluoroscopy was utilized by the requesting physician.  No radiographic interpretation.   Medications: . cefTRIAXone (ROCEPHIN)  IV 1 g (05/23/17 1634)  .  ferric gluconate (FERRLECIT/NULECIT) IV 125 mg (05/23/17 1634)   . amLODipine  10 mg Oral Daily  . aspirin EC  81 mg Oral Daily  . azithromycin  500 mg Oral Daily  . calcitRIOL  0.5 mcg Oral Daily  . carvedilol  25 mg Oral BID WC  . darbepoetin (ARANESP) injection - DIALYSIS  200 mcg Intravenous Q Tue-HD  . hydrALAZINE  50 mg Oral Q8H  . Influenza vac split quadrivalent PF  0.5 mL Intramuscular Tomorrow-1000  . sevelamer carbonate  1,600 mg Oral TID WC  . simvastatin  20 mg Oral q1800  . sodium chloride flush  10-40 mL Intracatheter Q12H    Background:  PMH HTN, medical non-compliance, progressive CKD with failure to f/u at [place]. Last hosp 09/2016 w/creatinine 5. Was to have followed up with Dr. Justin Mend 10/17/16. Lost to F/U. Comes to ED with uremic symptoms, Hb 6, creatinine 22 - new ESRD. PNA on CXR as well as edema.    Assessment/Recommendations  1. New ESRD - Permanent access today R BC AVF, temp cath tunnelled R IJ (Dr. Scot Dock) Outpt spot at [facility] and can start on Monday. Will HD today then again tomorrow on outpt schedule. 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 Change calcitriol to hectorol 3 mcg MWF HD. Continue sevelamer 800 2 with meals. .  3. Anemia - ESRD + Fe def. Transfused 2 units 3/4. Started Aranesp 200 on 3/5. Ferrlecit QDX8 to started 3/6. Needs another unit of PRBC's Can give with HD today. 4. HTN - meds 5. PNA - s/p ATB's.  Jamal Maes, MD [facility] [phone] pager 05/24/2017, 12:25 PM

## 2017-05-24 NOTE — Progress Notes (Signed)
  PROGRESS NOTE  Colin [name] MLY:[id number] DOB: 1965-06-12 DOA: 05/21/2017 PCP: Wenda Low, MD  Brief Narrative: 52 year old male PMH essential hypertension, chronic kidney disease presented with shortness of breath, loss of appetite, nausea, vomiting, lower extremity edema.  Initial creatinine 21.8.  Admitted for further evaluation by nephrology, treatment of pneumonia, uncontrolled hypertension, anemia.  Assessment/Plan New end-stage renal disease.  Hemodialysis initiated this admission. --Continue hemodialysis per nephrology.  Plan for HD today, then again tomorrow.  It appears outpatient spot at [facility] has been obtained and patient can start Monday.  Right lower lobe pneumonia --Appears asymptomatic.   --Wean oxygen --Continue ceftriaxone, azithromycin.  Can probably change to oral cephalosporin tomorrow.  Essential hypertension --Stable.  Continue Norvasc, carvedilol, hydralazine.  Adjust doses as needed as volume status improves.  Anemia of end-stage renal disease.  Status post 2 units PRBC on March 4. --Continue management per nephrology --Additional unit PRBCs planned by nephrology today  Thrombocytopenia. --Chronic since 2010.  Follow-up as an outpatient.   DVT prophylaxis: SCDs Code Status: full Family Communication:  Disposition Plan: home    Murray Hodgkins, MD  [facility] Direct contact: [phone] --[place]  --[email or web address]; password [email or web address]  7PM-7AM contact night coverage as above 05/24/2017, 6:31 PM  LOS: 3 days   Consultants:  Nephrology  VVS  Procedures:  Temporary HD catheter placed 3/5 \  3/7 Removal of right IJ temporary dialysis catheter 2.  Ultrasound-guided placement of right IJ 23 cm tunneled dialysis catheter 3.  Right brachiocephalic AV fistula  Antimicrobials:  Ceftriaxone 3/4  >>   Azithromycin 3/4 >>   Interval history/Subjective: Feels fine, no complaints.  Objective: Vitals:  Vitals:     05/24/17 1131 05/24/17 1557  BP: (!) 151/91 (!) 145/81  Pulse: 76 92  Resp: 15 16  Temp: 98.1 F (36.7 C) 99.2 F (37.3 C)  SpO2: 96% 99%    Exam:  Constitutional:  . Appears calm and comfortable Respiratory:  . CTA bilaterally, no w/r/r.  . Respiratory effort normal Cardiovascular:  . RRR, no m/r/g Psychiatric:  . Mental status o Mood, affect appropriate  I have personally reviewed the following:   Labs:  Blood sugars stable  Basic metabolic panel noted, potassium within normal limits.  Creatinine 11.06.  Hemoglobin down to 6.9.  Platelets 140  Plts 140   Imaging studies:  Chest x-ray independently reviewed, right lower lobe infiltrate improved.  Scheduled Meds: . amLODipine  10 mg Oral Daily  . aspirin EC  81 mg Oral Daily  . azithromycin  500 mg Oral Daily  . carvedilol  25 mg Oral BID WC  . darbepoetin (ARANESP) injection - DIALYSIS  200 mcg Intravenous Q Tue-HD  . [START ON 05/25/2017] doxercalciferol  3 mcg Intravenous Q M,W,F-HD  . hydrALAZINE  50 mg Oral Q8H  . Influenza vac split quadrivalent PF  0.5 mL Intramuscular Tomorrow-1000  . sevelamer carbonate  1,600 mg Oral TID WC  . simvastatin  20 mg Oral q1800  . sodium chloride flush  10-40 mL Intracatheter Q12H   Continuous Infusions: . cefTRIAXone (ROCEPHIN)  IV 1 g (05/23/17 1634)  . ferric gluconate (FERRLECIT/NULECIT) IV 125 mg (05/23/17 1634)    Principal Problem:   ESRD (end stage renal disease) ([place]) Active Problems:   Hypertension   Anemia   Thrombocytopenia (HCC)   Lobar pneumonia ([place])   Benign essential HTN   LOS: 3 days

## 2017-05-25 ENCOUNTER — Encounter: Payer: Self-pay | Admitting: Vascular Surgery

## 2017-05-25 ENCOUNTER — Encounter (HOSPITAL_COMMUNITY): Payer: Self-pay

## 2017-05-25 ENCOUNTER — Encounter (HOSPITAL_COMMUNITY): Payer: Self-pay | Admitting: Vascular Surgery

## 2017-05-25 LAB — CBC
HEMATOCRIT: 25.5 % — AB (ref 39.0–52.0)
Hemoglobin: 8.1 g/dL — ABNORMAL LOW (ref 13.0–17.0)
MCH: 28.2 pg (ref 26.0–34.0)
MCHC: 31.8 g/dL (ref 30.0–36.0)
MCV: 88.9 fL (ref 78.0–100.0)
PLATELETS: 131 10*3/uL — AB (ref 150–400)
RBC: 2.87 MIL/uL — ABNORMAL LOW (ref 4.22–5.81)
RDW: 14.2 % (ref 11.5–15.5)
WBC: 10.3 10*3/uL (ref 4.0–10.5)

## 2017-05-25 LAB — RENAL FUNCTION PANEL
Albumin: 2.5 g/dL — ABNORMAL LOW (ref 3.5–5.0)
Anion gap: 14 (ref 5–15)
BUN: 31 mg/dL — ABNORMAL HIGH (ref 6–20)
CO2: 26 mmol/L (ref 22–32)
Calcium: 8.6 mg/dL — ABNORMAL LOW (ref 8.9–10.3)
Chloride: 94 mmol/L — ABNORMAL LOW (ref 101–111)
Creatinine, Ser: 8.5 mg/dL — ABNORMAL HIGH (ref 0.61–1.24)
GFR calc Af Amer: 7 mL/min — ABNORMAL LOW (ref >60)
GFR calc non Af Amer: 6 mL/min — ABNORMAL LOW (ref >60)
Glucose, Bld: 163 mg/dL — ABNORMAL HIGH (ref 65–99)
Phosphorus: 4.9 mg/dL — ABNORMAL HIGH (ref 2.5–4.6)
Potassium: 3.4 mmol/L — ABNORMAL LOW (ref 3.5–5.1)
Sodium: 134 mmol/L — ABNORMAL LOW (ref 135–145)

## 2017-05-25 LAB — BPAM RBC
BLOOD PRODUCT EXPIRATION DATE: 201903192359
BLOOD PRODUCT EXPIRATION DATE: 201903252359
Blood Product Expiration Date: 201903112359
ISSUE DATE / TIME: 201903041310
ISSUE DATE / TIME: 201903041723
ISSUE DATE / TIME: 201903072047
UNIT TYPE AND RH: 6200
UNIT TYPE AND RH: 6200
Unit Type and Rh: 6200

## 2017-05-25 LAB — TYPE AND SCREEN
ABO/RH(D): A POS
Antibody Screen: NEGATIVE
UNIT DIVISION: 0
Unit division: 0
Unit division: 0

## 2017-05-25 MED ORDER — HYDRALAZINE HCL 25 MG PO TABS: 50.0000 mg | ORAL_TABLET | Freq: Three times a day (TID) | ORAL | Status: AC

## 2017-05-25 MED ORDER — DOXERCALCIFEROL 4 MCG/2ML IV SOLN
INTRAVENOUS | Status: AC
  Filled 2017-05-25: qty 2

## 2017-05-25 MED ORDER — SEVELAMER CARBONATE 800 MG PO TABS: 1600.0000 mg | ORAL_TABLET | Freq: Three times a day (TID) | ORAL | 2 refills | Status: AC

## 2017-05-25 MED ORDER — CEFPODOXIME PROXETIL 100 MG PO TABS: 100.0000 mg | ORAL_TABLET | Freq: Two times a day (BID) | ORAL | 0 refills | Status: DC

## 2017-05-25 MED ORDER — HEPARIN SODIUM (PORCINE) 1000 UNIT/ML DIALYSIS
1000.0000 [IU] | INTRAMUSCULAR | Status: DC | PRN
  Administered 2017-05-24: 1000 [IU] via INTRAVENOUS_CENTRAL

## 2017-05-25 NOTE — Progress Notes (Signed)
Mr. Colin [name] may return to work on May 28, 2017 with no heavy lifting.   He may resume lifting on June 11, 2017.  [name], MD [phone]

## 2017-05-25 NOTE — Anesthesia Postprocedure Evaluation (Signed)
Anesthesia Post Note  Patient: Jaysean Manville Andrews-Commey  Procedure(s) Performed: ARTERIOVENOUS (AV) FISTULA CREATION (Right Arm Upper) INSERTION OF DIALYSIS CATHETER (N/A )     Patient location during evaluation: PACU Anesthesia Type: MAC Level of consciousness: awake and alert Pain management: pain level controlled Vital Signs Assessment: post-procedure vital signs reviewed and stable Respiratory status: spontaneous breathing, nonlabored ventilation, respiratory function stable and patient connected to nasal cannula oxygen Cardiovascular status: stable and blood pressure returned to baseline Postop Assessment: no apparent nausea or vomiting Anesthetic complications: no    Last Vitals:  Vitals:   05/25/17 0418 05/25/17 0850  BP: 133/67 (!) 152/91  Pulse: 88 86  Resp: 16 17  Temp: 37.1 C 36.8 C  SpO2: 100% 98%    Last Pain:  Vitals:   05/25/17 0950  TempSrc:   PainSc: 0-No pain   Pain Goal:                 Riccardo Dubin

## 2017-05-25 NOTE — Plan of Care (Signed)
Reviewed with patient fluid restrictions 1200 cc (or 5 - 8oz cups) per day.  Also reviewed cath care, to keep drsg intact and not allow wetness.

## 2017-05-25 NOTE — Progress Notes (Signed)
   VASCULAR SURGERY ASSESSMENT & PLAN:   1 Day Post-Op s/p: Placement of tunneled dialysis catheter and right brachiocephalic AV fistula.  This fistula is working well. He has no symptoms of steal.  I have arranged follow-up in 6 weeks in the office. Vascular surgery will be available as needed.  Of note, he requested a return to work note and I have written this as a letter which has shown up as a progress note if this needs to be printed on discharge.  SUBJECTIVE:   No complaints  PHYSICAL EXAM:   Vitals:   05/24/17 2352 05/25/17 0019 05/25/17 0418 05/25/17 0850  BP: (!) 162/91 (!) 158/86 133/67 (!) 152/91  Pulse: 86 88 88 86  Resp: 17 17 16 17   Temp: 98.5 F (36.9 C) 98.8 F (37.1 C) 98.8 F (37.1 C) 98.2 F (36.8 C)  TempSrc: Oral   Oral  SpO2: 99% 98% 100% 98%  Weight: 186 lb 4.6 oz (84.5 kg)     Height:       He has an excellent thrill in his right upper arm fistula His incisions look fine. He has a palpable right radial pulse.  LABS:    PROBLEM LIST:    Principal Problem:   ESRD (end stage renal disease) ([place]) Active Problems:   Hypertension   Anemia   Thrombocytopenia (HCC)   Lobar pneumonia (HCC)   Benign essential HTN   CURRENT MEDS:   . amLODipine  10 mg Oral Daily  . aspirin EC  81 mg Oral Daily  . carvedilol  25 mg Oral BID WC  . darbepoetin (ARANESP) injection - DIALYSIS  200 mcg Intravenous Q Tue-HD  . doxercalciferol  3 mcg Intravenous Q M,W,F-HD  . hydrALAZINE  50 mg Oral Q8H  . Influenza vac split quadrivalent PF  0.5 mL Intramuscular Tomorrow-1000  . sevelamer carbonate  1,600 mg Oral TID WC  . simvastatin  20 mg Oral q1800  . sodium chloride flush  10-40 mL Intracatheter Q12H   [name] Beeper: [phone] Office: [phone] 05/25/2017

## 2017-05-25 NOTE — Procedures (Signed)
I have personally attended this patient's dialysis session.   4K bath Post weight will be EDW TDC [place], MD [place] Pager 05/25/2017, 4:39 PM

## 2017-05-25 NOTE — Progress Notes (Signed)
HD tx completed @ 2345 w/o problem, UF goal met, blood rinsed back, received 1 unit PRBC w/o s/s of reaction, VSS, report called to Sima Matas, RN

## 2017-05-25 NOTE — Care Management Note (Signed)
Case Management Note  Patient Details  Name: Colin [name] MRN: [id number] Date of Birth: 1965-07-25  Subjective/Objective:   Admitted for end-stage renal disease requiring dialysis; also noted to have right lower lobe pneumonia as evidenced by chest x-ray and will complete his antibiotic regimen on Thursday 3/7      Action/Plan:  Patient with discharge orders home for today.  Expected Discharge Date:    05/25/2017              Expected Discharge Plan:  Home/Self Care  Discharge planning Services  CM Consult  Status of Service:  Completed, signed off  Kristen Cardinal, RN  Nurse case [place] 05/25/2017, 10:41 AM

## 2017-05-25 NOTE — Progress Notes (Signed)
05/25/17 2030 Patient discharged home,accompanied  by family member.All belongings,clothes,cell pnone,charger sent with patient.IV on LFA taken out. Merissa Renwick, Wonda Cheng, Therapist, sports

## 2017-05-25 NOTE — Progress Notes (Signed)
[facility] Rounding Note  Subjective/Interval History:   S/p TDC and R BC AVF 3/7 Dr. Scot Dock Getting HD today and I think OK to go home after that Has MWF 2nd shift spot at [facility] and can start there on Monday  Objective Vital signs in last 24 hours: Vitals:   05/24/17 2352 05/25/17 0019 05/25/17 0418 05/25/17 0850  BP: (!) 162/91 (!) 158/86 133/67 (!) 152/91  Pulse: 86 88 88 86  Resp: 17 17 16 17   Temp: 98.5 F (36.9 C) 98.8 F (37.1 C) 98.8 F (37.1 C) 98.2 F (36.8 C)  TempSrc: Oral   Oral  SpO2: 99% 98% 100% 98%  Weight: 84.5 kg (186 lb 4.6 oz)     Height:       Weight change: 0 kg (0 lb)  Intake/Output Summary (Last 24 hours) at 05/25/2017 1633 Last data filed at 05/25/2017 1400 Gross per 24 hour  Intake 885 ml  Output 2405 ml  Net -1520 ml   Physical Exam:  Blood pressure (!) 152/91, pulse 86, temperature 98.2 F (36.8 C), temperature source Oral, resp. rate 17, height 5\' 10"  (1.778 m), weight 84.5 kg (186 lb 4.6 oz), SpO2 98 %.   Seen in HD Chest ant clear S1S2 No S3 or pericardial rub Abdomen soft, no focal tenderness No edema LE's now R BC AVF (3/6 Dr. Scot Dock) R IJ TDC (3/6) in use BFR 400  Recent Labs  Lab 05/21/17 0845 05/21/17 1257 05/22/17 1100 05/23/17 1148 05/24/17 0414 05/25/17 1430  NA 131* 132* 133* 136 137 134*  K 3.8 4.1 4.1 3.7 3.5 3.4*  CL 101 103 103 102 101 94*  CO2 14*  --  15* 20* 25 26  GLUCOSE 105* 94 137* 110* 99 163*  BUN 115* 108* 113* 71* 37* 31*  CREATININE 21.80* >18.00* 21.37* 16.30* 11.06* 8.50*  CALCIUM 8.6*  --  8.9 8.7* 8.2* 8.6*  PHOS  --   --  8.6* 6.6*  --  4.9*    Recent Labs  Lab 05/22/17 1100 05/23/17 0410 05/24/17 0414 05/25/17 1023  WBC 8.1 8.5 8.0 10.3  NEUTROABS 6.4  --   --   --   HGB 7.3* 7.4* 6.9* 8.1*  HCT 22.1* 22.5* 21.7* 25.5*  MCV 83.1 84.3 87.1 88.9  PLT 180 157 140* 131*   PTH    Component Value Date/Time   PTH 800 (H) 05/22/2017 1100   Iron/TIBC/Ferritin/ %Sat    Component Value  Date/Time   IRON 20 (L) 05/22/2017 1100   TIBC 228 (L) 05/22/2017 1100   FERRITIN 736 (H) 05/22/2017 0800   IRONPCTSAT 9 (L) 05/22/2017 1100   Recent Labs  Lab 05/21/17 1829 05/22/17 0034  GLUCAP 93 143*   Studies/Results: Dg Chest Port 1 View  Result Date: 05/24/2017 CLINICAL DATA:  Dialysis catheter placement. EXAM: PORTABLE CHEST 1 VIEW COMPARISON:  05/21/2017 FINDINGS: Right dialysis catheter tip in the upper right atrium. No pneumothorax. Cardiomegaly. Prior median sternotomy. Focal airspace opacity in the right lower lobe concerning for pneumonia. Small right effusion. Findings similar to prior study. No focal opacity on the left. IMPRESSION: Right dialysis catheter placement with the tip in the upper right atrium. No pneumothorax. Continued right lower lobe pneumonia with small right effusion. Cardiomegaly. Electronically Signed   By: Rolm Baptise M.D.   On: 05/24/2017 11:20   Dg Fluoro Guide Cv Line-no Report  Result Date: 05/24/2017 Fluoroscopy was utilized by the requesting physician.  No radiographic interpretation.   Medications: .  sodium chloride    . cefTRIAXone (ROCEPHIN)  IV Stopped (05/25/17 0100)  . ferric gluconate (FERRLECIT/NULECIT) IV Stopped (05/25/17 1053)   . amLODipine  10 mg Oral Daily  . aspirin EC  81 mg Oral Daily  . carvedilol  25 mg Oral BID WC  . darbepoetin (ARANESP) injection - DIALYSIS  200 mcg Intravenous Q Tue-HD  . doxercalciferol  3 mcg Intravenous Q M,W,F-HD  . hydrALAZINE  50 mg Oral Q8H  . Influenza vac split quadrivalent PF  0.5 mL Intramuscular Tomorrow-1000  . sevelamer carbonate  1,600 mg Oral TID WC  . simvastatin  20 mg Oral q1800  . sodium chloride flush  10-40 mL Intracatheter Q12H    Background:  PMH HTN, medical non-compliance, progressive CKD with failure to f/u at [place]. Last hosp 09/2016 w/creatinine 5. Was to have followed up with Dr. Justin Mend 10/17/16. Lost to F/U. Comes to ED with uremic symptoms, Hb 6, creatinine 22 - new ESRD.  PNA on CXR as well as edema.   Assessment/Recommendations  1. New ESRD - Permanent access 3/7 R BC AVF, temp cath tunnelled R IJ (Dr. Scot Dock) Outpt spot at [facility] and can start on Monday. Today HD #4. OK with me for him to go home post HD. Post weight will be EDW. 4 hours 15 minutes. Tight heparin.  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 Changed calcitriol to hectorol 3 mcg MWF HD. Continue sevelamer 800 2 with meals.   3. Anemia - ESRD + Fe def. Transfused 2 units 3/4, 1 unit 3.7. Started Aranesp 200 on 3/5. Ferrlecit QDX8 started 3/6 (today dose 3 of 8).  4. HTN - meds 5. PNA - s/p ATB's.  Jamal Maes, MD [facility] [phone] pager 05/25/2017, 4:33 PM

## 2017-05-25 NOTE — Progress Notes (Signed)
Discharge instructions/papers and prescriptions given to patient. Patient understands he need to call MD on Monday for appointment. Patient understands instructions,questions were answered. Nema Oatley, Wonda Cheng, Therapist, sports

## 2017-05-26 LAB — CULTURE, BLOOD (ROUTINE X 2)
Culture: NO GROWTH
Special Requests: ADEQUATE

## 2017-05-28 ENCOUNTER — Telehealth: Payer: Self-pay | Admitting: Vascular Surgery

## 2017-05-28 LAB — CULTURE, BLOOD (ROUTINE X 2)
CULTURE: NO GROWTH
Special Requests: ADEQUATE

## 2017-05-28 NOTE — Telephone Encounter (Signed)
-----   Message from Mena Goes, RN sent at 05/24/2017 11:20 AM EST ----- Regarding: 6-7 weeks w/ fistula duplex   ----- Message ----- From: Ulyses Amor, PA-C Sent: 05/24/2017  10:19 AM To: Vvs Charge Pool  F/u with PA on Wednesday clinic in 6-7 weeks needs fistula duplex s/p left av fistula

## 2017-05-28 NOTE — Telephone Encounter (Signed)
LVM for pt RE 07/11/17 appt

## 2017-05-28 NOTE — Discharge Summary (Signed)
Physician Discharge Summary  Colin [name] WUJ:[id number] DOB: [birth year] DOA: 05/21/2017  PCP: Wenda Low, MD  Admit date: 05/21/2017 Discharge date: 05/25/2017  Time spent: 35 minutes  Recommendations for Outpatient Follow-up:  Outpatient HD on Monday 3/11 FU CXR in 4-6weeks PCP in 1 week, please check CBC to FU on platelet count  Discharge Diagnoses:  Principal Problem:   ESRD (end stage renal disease) ([place]) Active Problems:   Hypertension   Anemia   Thrombocytopenia (HCC)   Lobar pneumonia ([place])   Benign essential HTN   Discharge Condition: stable  Diet recommendation: Renal  Filed Weights   05/24/17 2006 05/24/17 2352 05/25/17 1350  Weight: 86.5 kg (190 lb 11.2 oz) 84.5 kg (186 lb 4.6 oz) 84.6 kg (186 lb 8.2 oz)    History of present illness:  52 year old male PMH essential hypertension, chronic kidney disease presented with shortness of breath, loss of appetite, nausea, vomiting, lower extremity edema  Hospital Course:   New end-stage renal disease.   -renal consulted -s/p placement of right IJ 23 cm tunneled dialysis catheter, started Hemodialysis this admission -stable now and volume and uremia symptoms controlled -had Right brachiocephalic AV fistula created per VVS on 3/7 -stable and discharged home today -outpatient spot at [facility] has been obtained and patient can start HD on Monday.  Right lower lobe pneumonia --improved, weaned off O2 -treated with ceftriaxone, azithromycin, discharged home on oral cefpodoxime -FU CXR in 4-6weeks  Essential hypertension --Stable.  Continue Norvasc, carvedilol, hydralazine -dose cut down  Anemia of end-stage renal disease.  Status post 2 units PRBC on March 4. --Continue management per nephrology -stable, continue Aranesp and Iron with HD  Thrombocytopenia. --Chronic since 2010.  Follow-up as an outpatient.    Consultants:  Nephrology  VVS  Procedures:  Temporary HD catheter  placed 3/5 \  3/7 Removal of right IJ temporary dialysis catheter 2.Ultrasound-guided placement of right IJ 23 cm tunneled dialysis catheter 3.Right brachiocephalic AV fistula    Discharge Exam: Vitals:   05/25/17 1630 05/25/17 1700  BP: (!) 154/88 (!) 153/94  Pulse: 81 83  Resp: 17 17  Temp:    SpO2:      General: AAOx3 Cardiovascular: S1S2/RRR Respiratory: CTAB  Discharge Instructions   Discharge Instructions    Diet - low sodium heart healthy   Complete by:  As directed    Discharge instructions   Complete by:  As directed    Renal Diet   Increase activity slowly   Complete by:  As directed    Increase activity slowly   Complete by:  As directed      Allergies as of 05/25/2017   No Known Allergies     Medication List    STOP taking these medications   furosemide 40 MG tablet Commonly known as:  LASIX     TAKE these medications   amLODipine 10 MG tablet Commonly known as:  NORVASC Take 1 tablet (10 mg total) by mouth daily.   aspirin 81 MG EC tablet Take 1 tablet (81 mg total) by mouth daily.   Blood Pressure Cuff Misc Use blood pressure cuff to measure your blood pressure at the same time each day. Record each blood pressure and bring to your primary care physician.   carvedilol 12.5 MG tablet Commonly known as:  COREG Take 2 tablets (25 mg total) by mouth 2 (two) times daily with a meal.   cefpodoxime 100 MG tablet Commonly known as:  VANTIN Take 1 tablet (100  mg total) by mouth 2 (two) times daily. For 2days   hydrALAZINE 25 MG tablet Commonly known as:  APRESOLINE Take 2 tablets (50 mg total) by mouth 3 (three) times daily. What changed:    how much to take  when to take this   sevelamer carbonate 800 MG tablet Commonly known as:  RENVELA Take 2 tablets (1,600 mg total) by mouth 3 (three) times daily with meals.   simvastatin 20 MG tablet Commonly known as:  ZOCOR Take 20 mg by mouth daily.      No Known  Allergies Follow-up Information    Angelia Mould, MD Follow up in 6 week(s).   Specialties:  Vascular Surgery, Cardiology Why:  Office will call you to arrange your appt (sent) Contact information: [place] [id number] [phone]        Wenda Low, MD. Schedule an appointment as soon as possible for a visit in 1 week(s).   Specialty:  Internal Medicine Contact information: [address] [place] [address] [phone]            The results of significant diagnostics from this hospitalization (including imaging, microbiology, ancillary and laboratory) are listed below for reference.    Significant Diagnostic Studies: Dg Chest 2 View  Result Date: 05/21/2017 CLINICAL DATA:  Shortness of breath, weakness, nonproductive cough, and decreased appetite for 1 month. Vomiting for 1-2 weeks. EXAM: CHEST  2 VIEW COMPARISON:  09/27/2016 FINDINGS: Prior sternotomy and cardiomegaly are again noted. Central pulmonary vascular congestion is similar to the prior study. There is new mild elevation of the right hemidiaphragm with new right basilar airspace opacity and a suspected small right pleural effusion. No pneumothorax is identified. No acute osseous abnormality is seen. IMPRESSION: 1. New right basilar airspace opacity concerning for pneumonia and possible small pleural effusion. Followup PA and lateral chest X-ray is recommended in 3-4 weeks following trial of antibiotic therapy to ensure resolution and exclude underlying malignancy. 2. Chronic cardiomegaly and mild pulmonary vascular congestion. Electronically Signed   By: Logan Bores M.D.   On: 05/21/2017 09:19   Ir Fluoro Guide Cv Line Right  Result Date: 05/22/2017 CLINICAL DATA:  Renal failure, needs venous access for hemodialysis. EXAM: EXAM RIGHT IJ CATHETER PLACEMENT UNDER ULTRASOUND AND FLUOROSCOPIC GUIDANCE TECHNIQUE: The procedure, risks (including but not limited to bleeding,  infection, organ damage, pneumothorax), benefits, and alternatives were explained to the patient. Questions regarding the procedure were encouraged and answered. The patient understands and consents to the procedure. Patency of the right IJ vein was confirmed with ultrasound with image documentation. An appropriate skin site was determined. Skin site was marked. Region was prepped using maximum barrier technique including cap and mask, sterile gown, sterile gloves, large sterile sheet, and Chlorhexidine as cutaneous antisepsis. The region was infiltrated locally with 1% lidocaine. Under real-time ultrasound guidance, the right IJ vein was accessed with a 21 gauge needle; the needle tip within the vein was confirmed with ultrasound image documentation. The needle exchanged over a 018 guidewire for vascular dilator. The guidewire would not advance centrally. Limited venography demonstrated tortuous stenotic region near the confluence of the right IJ vein and brachiocephalic vein. This was negotiated with an angled Glidewire, and the tract was dilated with serial vascular dilators, which allowed advancement of a 20 cm Mahurkar catheter. This was positioned with the tip at the cavoatrial junction. Spot chest radiograph shows good positioning and no pneumothorax. Catheter was flushed and sutured externally with 0-Prolene  sutures. Patient tolerated the procedure well. FLUOROSCOPY TIME:  1.6 minutes; 95  uGym2 DAP COMPLICATIONS: COMPLICATIONS none IMPRESSION: 1. Technically successful right IJ Mahurkar catheter placement. Electronically Signed   By: Lucrezia Europe M.D.   On: 05/22/2017 09:24   Ir US Guide Vasc Access Right  Result Date: 05/22/2017 CLINICAL DATA:  Renal failure, needs venous access for hemodialysis. EXAM: EXAM RIGHT IJ CATHETER PLACEMENT UNDER ULTRASOUND AND FLUOROSCOPIC GUIDANCE TECHNIQUE: The procedure, risks (including but not limited to bleeding, infection, organ damage, pneumothorax), benefits, and  alternatives were explained to the patient. Questions regarding the procedure were encouraged and answered. The patient understands and consents to the procedure. Patency of the right IJ vein was confirmed with ultrasound with image documentation. An appropriate skin site was determined. Skin site was marked. Region was prepped using maximum barrier technique including cap and mask, sterile gown, sterile gloves, large sterile sheet, and Chlorhexidine as cutaneous antisepsis. The region was infiltrated locally with 1% lidocaine. Under real-time ultrasound guidance, the right IJ vein was accessed with a 21 gauge needle; the needle tip within the vein was confirmed with ultrasound image documentation. The needle exchanged over a 018 guidewire for vascular dilator. The guidewire would not advance centrally. Limited venography demonstrated tortuous stenotic region near the confluence of the right IJ vein and brachiocephalic vein. This was negotiated with an angled Glidewire, and the tract was dilated with serial vascular dilators, which allowed advancement of a 20 cm Mahurkar catheter. This was positioned with the tip at the cavoatrial junction. Spot chest radiograph shows good positioning and no pneumothorax. Catheter was flushed and sutured externally with 0-Prolene sutures. Patient tolerated the procedure well. FLUOROSCOPY TIME:  1.6 minutes; 95  uGym2 DAP COMPLICATIONS: COMPLICATIONS none IMPRESSION: 1. Technically successful right IJ Mahurkar catheter placement. Electronically Signed   By: Lucrezia Europe M.D.   On: 05/22/2017 09:24   Dg Chest Port 1 View  Result Date: 05/24/2017 CLINICAL DATA:  Dialysis catheter placement. EXAM: PORTABLE CHEST 1 VIEW COMPARISON:  05/21/2017 FINDINGS: Right dialysis catheter tip in the upper right atrium. No pneumothorax. Cardiomegaly. Prior median sternotomy. Focal airspace opacity in the right lower lobe concerning for pneumonia. Small right effusion. Findings similar to prior  study. No focal opacity on the left. IMPRESSION: Right dialysis catheter placement with the tip in the upper right atrium. No pneumothorax. Continued right lower lobe pneumonia with small right effusion. Cardiomegaly. Electronically Signed   By: Rolm Baptise M.D.   On: 05/24/2017 11:20   Dg Fluoro Guide Cv Line-no Report  Result Date: 05/24/2017 Fluoroscopy was utilized by the requesting physician.  No radiographic interpretation.    Microbiology: Recent Results (from the past 240 hour(s))  Culture, blood (routine x 2) Call MD if unable to obtain prior to antibiotics being given     Status: None   Collection Time: 05/21/17 10:38 PM  Result Value Ref Range Status   Specimen Description BLOOD RIGHT HAND  Final   Special Requests AEROBIC BOTTLE ONLY Blood Culture adequate volume  Final   Culture   Final    NO GROWTH 5 DAYS Performed at [facility], [address] [place], [place] [address]    Report Status 05/26/2017 FINAL  Final  Culture, blood (Routine X 2) w Reflex to ID Panel     Status: None   Collection Time: 05/23/17  7:40 AM  Result Value Ref Range Status   Specimen Description BLOOD RIGHT HAND  Final   Special Requests   Final  BOTTLES DRAWN AEROBIC AND ANAEROBIC Blood Culture adequate volume   Culture   Final    NO GROWTH 5 DAYS Performed at [facility], [place] [address] [place], [place] [address]    Report Status 05/28/2017 FINAL  Final  Surgical PCR screen     Status: None   Collection Time: 05/23/17  8:40 PM  Result Value Ref Range Status   MRSA, PCR NEGATIVE NEGATIVE Final   Staphylococcus aureus NEGATIVE NEGATIVE Final    Comment: (NOTE) The Xpert SA Assay (FDA approved for NASAL specimens in patients 59 years of age and older), is one component of a comprehensive surveillance program. It is not intended to diagnose infection nor to guide or monitor treatment. Performed at [facility], [place] [address] [place], [place] [address]       Labs: Basic Metabolic Panel: Recent Labs  Lab 05/22/17 1100 05/23/17 1148 05/24/17 0414 05/25/17 1430  NA 133* 136 137 134*  K 4.1 3.7 3.5 3.4*  CL 103 102 101 94*  CO2 15* 20* 25 26  GLUCOSE 137* 110* 99 163*  BUN 113* 71* 37* 31*  CREATININE 21.37* 16.30* 11.06* 8.50*  CALCIUM 8.9 8.7* 8.2* 8.6*  PHOS 8.6* 6.6*  --  4.9*   Liver Function Tests: Recent Labs  Lab 05/22/17 1100 05/23/17 1148 05/25/17 1430  ALBUMIN 2.5* 2.5* 2.5*   No results for input(s): LIPASE, AMYLASE in the last 168 hours. No results for input(s): AMMONIA in the last 168 hours. CBC: Recent Labs  Lab 05/22/17 1100 05/23/17 0410 05/24/17 0414 05/25/17 1023  WBC 8.1 8.5 8.0 10.3  NEUTROABS 6.4  --   --   --   HGB 7.3* 7.4* 6.9* 8.1*  HCT 22.1* 22.5* 21.7* 25.5*  MCV 83.1 84.3 87.1 88.9  PLT 180 157 140* 131*   Cardiac Enzymes: No results for input(s): CKTOTAL, CKMB, CKMBINDEX, TROPONINI in the last 168 hours. BNP: BNP (last 3 results) Recent Labs    06/17/16 1031  BNP 1,743.8*    ProBNP (last 3 results) No results for input(s): PROBNP in the last 8760 hours.  CBG: Recent Labs  Lab 05/21/17 1829 05/22/17 0034  GLUCAP 93 143*       Signed:  Domenic Polite MD.  [facility] 05/28/2017, 2:30 PM

## 2017-06-20 ENCOUNTER — Other Ambulatory Visit: Payer: Self-pay

## 2017-06-20 DIAGNOSIS — N186 End stage renal disease: Secondary | ICD-10-CM

## 2017-07-09 ENCOUNTER — Emergency Department (HOSPITAL_COMMUNITY)
Admission: EM | Admit: 2017-07-09 | Discharge: 2017-07-09 | Disposition: A | Discharge status: Home / Self Care | Payer: Managed Care, Other (non HMO) | Attending: Emergency Medicine | Admitting: Emergency Medicine

## 2017-07-09 ENCOUNTER — Encounter (HOSPITAL_COMMUNITY): Payer: Self-pay

## 2017-07-09 DIAGNOSIS — M7022 Olecranon bursitis, left elbow: Secondary | ICD-10-CM | POA: Insufficient documentation

## 2017-07-09 DIAGNOSIS — Z79899 Other long term (current) drug therapy: Secondary | ICD-10-CM | POA: Diagnosis not present

## 2017-07-09 DIAGNOSIS — I120 Hypertensive chronic kidney disease with stage 5 chronic kidney disease or end stage renal disease: Secondary | ICD-10-CM | POA: Diagnosis not present

## 2017-07-09 DIAGNOSIS — N186 End stage renal disease: Secondary | ICD-10-CM | POA: Diagnosis not present

## 2017-07-09 DIAGNOSIS — Y939 Activity, unspecified: Secondary | ICD-10-CM | POA: Insufficient documentation

## 2017-07-09 DIAGNOSIS — R2232 Localized swelling, mass and lump, left upper limb: Secondary | ICD-10-CM | POA: Diagnosis present

## 2017-07-09 NOTE — ED Triage Notes (Signed)
Fluid filled cyst to left elbow to left elbow since Friday. No pain.

## 2017-07-09 NOTE — Discharge Instructions (Addendum)
Follow these instructions at home: °Take medicines only as directed by your health care provider. °If you were prescribed an antibiotic medicine, finish all of it even if you start to feel better. °If your bursitis is caused by an injury, rest your elbow and wear your bandage as directed by your health care provider. You may also apply ice to the injured area as directed by your health care provider: °Put ice in a plastic bag. °Place a towel between your skin and the bag. °Leave the ice on for 20 minutes, 2-3 times per day. °Avoid any activities that cause elbow pain. °Use elbow pads or elbow wraps to cushion your elbow. °Contact a health care provider if: °You have a fever. °Your symptoms do not get better with treatment. °Your pain or swelling gets worse. °Your elbow pain or swelling goes away and then returns. °You have drainage of pus from the swollen area over your elbow. °

## 2017-07-09 NOTE — ED Provider Notes (Signed)
[facility] EMERGENCY DEPARTMENT Provider Note   CSN: [id number] Arrival date & time: 07/09/17  1239     History   Chief Complaint Chief Complaint  Patient presents with  . Cyst    HPI Colin [name] is a 52 y.o. male with a complete a past medical history including recent start on dialysis 6 weeks ago for end-stage renal disease present to the emergency room for evaluation of left elbow swelling.  Patient states that this morning he noticed a nonpainful swelling over the left elbow.  He does not remember any injuring.  He does not have any tenderness.  Patient was at dialysis and 1 of the nurses told me need to come to the ER to have it drained.  He denies fevers or chills.  HPI  Past Medical History:  Diagnosis Date  . 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 Gilford Raid  . ETOH abuse   . 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 Hypertension   . Thyroid nodule     Patient Active Problem List   Diagnosis Date Noted  . Lobar pneumonia ([place]) 05/24/2017  . Benign essential HTN 05/24/2017  . ESRD (end stage renal disease) ([facility]) 05/21/2017  . Anemia 09/27/2016  . Thrombocytopenia ([place]) 09/27/2016  . Hypertensive emergency 06/17/2016  . Hypokalemia 06/17/2016  . Elevated transaminase level 06/17/2016  . Elevated troponin 06/17/2016  . Dyspnea 06/17/2016  . Hypertension   . ETOH abuse   . Hyperparathyroidism, primary ([place] [facility]) 05/15/2012  . Thyroid nodule, uninodular, left lobe 05/15/2012  . Descending aortic aneurysm ([facility]) 03/20/2008    Past Surgical History:  Procedure Laterality Date  . AV FISTULA PLACEMENT Right 05/24/2017   Procedure: ARTERIOVENOUS (AV) FISTULA CREATION;  Surgeon: Angelia Mould, MD;  Location: [facility];  Service: Vascular;  Laterality: Right;  . CARDIAC SURGERY     pt unsure of actual Sx - knows it was due to "anurysm"  . INSERTION OF DIALYSIS CATHETER N/A 05/24/2017   Procedure: INSERTION OF DIALYSIS  CATHETER;  Surgeon: Angelia Mould, MD;  Location: [facility];  Service: Vascular;  Laterality: N/A;  . IR FLUORO GUIDE CV LINE RIGHT  05/22/2017  . IR US GUIDE VASC ACCESS RIGHT  05/22/2017  . THROAT SURGERY          Home Medications    Prior to Admission medications   Medication Sig Start Date End Date Taking? Authorizing Provider  amLODipine (NORVASC) 10 MG tablet Take 1 tablet (10 mg total) by mouth daily. 09/30/16   Bonnielee Haff, MD  aspirin EC 81 MG EC tablet Take 1 tablet (81 mg total) by mouth daily. 10/01/16   Bonnielee Haff, MD  Blood Pressure Monitoring (BLOOD PRESSURE CUFF) MISC Use blood pressure cuff to measure your blood pressure at the same time each day. Record each blood pressure and bring to your primary care physician. 06/20/16   Dessa Phi, DO  carvedilol (COREG) 12.5 MG tablet Take 2 tablets (25 mg total) by mouth 2 (two) times daily with a meal. 09/30/16   Bonnielee Haff, MD  cefpodoxime (VANTIN) 100 MG tablet Take 1 tablet (100 mg total) by mouth 2 (two) times daily. For 2days 05/25/17   Domenic Polite, MD  hydrALAZINE (APRESOLINE) 25 MG tablet Take 2 tablets (50 mg total) by mouth 3 (three) times daily. 05/25/17   Domenic Polite, MD  sevelamer carbonate (RENVELA) 800 MG tablet Take 2 tablets (1,600 mg total) by mouth 3 (three) times daily  with meals. 05/25/17   Domenic Polite, MD  simvastatin (ZOCOR) 20 MG tablet Take 20 mg by mouth daily.    [provider]    Family History Family History  Problem Relation Age of Onset  . Diabetes Mother   . Hypertension Father   . Diabetes Sister     Social History Social History   Tobacco Use  . Smoking status: Never Smoker  . Smokeless tobacco: Never Used  Substance Use Topics  . Alcohol use: Yes    Alcohol/week: 3.0 oz    Types: 5 Cans of beer per week    Comment: previousy reported 35 beers a week, but now maybe 1-2 bottles on weekend days  . Drug use: No     Allergies   Patient has no known  allergies.   Review of Systems Review of Systems  Positive for swelling over the elbow, negative for joint pain, chills, fever Physical Exam Updated Vital Signs BP 134/89 (BP Location: Left Arm)   Pulse 85   Temp 98.5 F (36.9 C) (Oral)   Resp 16   SpO2 100%   Physical Exam Physical Exam  Nursing note and vitals reviewed. Constitutional: He appears well-developed and well-nourished. No distress.  HENT:  Head: Normocephalic and atraumatic.  Eyes: Conjunctivae normal are normal. No scleral icterus.  Neck: Normal range of motion. Neck supple.  Cardiovascular: Normal rate, regular rhythm and normal heart sounds.   Pulmonary/Chest: Effort normal and breath sounds normal. No respiratory distress. Dialysis catheter in place in the right upper chest wall Abdominal: Soft. There is no tenderness.  Musculoskeletal: Nontender, well-circumscribed swelling over the left olecranon process consisting with olecranon bursitis, full range of motion of the left elbow without tenderness. Neurological: He is alert.  Skin: Skin is warm and dry. He is not diaphoretic.  Psychiatric: His behavior is normal.     ED Treatments / Results  Labs (all labs ordered are listed, but only abnormal results are displayed) Labs Reviewed - No data to display  EKG None  Radiology No results found.  Procedures Procedures (including critical care time)  Medications Ordered in ED Medications - No data to display   Initial Impression / Assessment and Plan / ED Course  I have reviewed the triage vital signs and the nursing notes.  Pertinent labs & imaging results that were available during my care of the patient were reviewed by me and considered in my medical decision making (see chart for details).    Patient with olecranon bursitis of the left arm.  He does not need any imaging.  He has no injury.  He denies fevers or chills.  It is nontender and does not suggest infection.  Patient will be discharged  with supportive care and hand follow-up.  He appears appropriate for discharge at this time Final Clinical Impressions(s) / ED Diagnoses   Final diagnoses:  Olecranon bursitis of left elbow    ED Discharge Orders    None       Margarita Mail, PA-C 07/09/17 [place], MD 07/16/17 1005

## 2017-07-11 ENCOUNTER — Ambulatory Visit (INDEPENDENT_AMBULATORY_CARE_PROVIDER_SITE_OTHER): Payer: Self-pay | Admitting: Physician Assistant

## 2017-07-11 ENCOUNTER — Encounter: Payer: Self-pay | Admitting: Physician Assistant

## 2017-07-11 ENCOUNTER — Ambulatory Visit (HOSPITAL_COMMUNITY)
Admission: RE | Admit: 2017-07-11 | Discharge: 2017-07-11 | Disposition: A | Discharge status: Home / Self Care | Payer: Managed Care, Other (non HMO) | Source: Ambulatory Visit | Attending: Vascular Surgery | Admitting: Vascular Surgery

## 2017-07-11 ENCOUNTER — Other Ambulatory Visit: Payer: Self-pay

## 2017-07-11 VITALS — BP 134/73 | HR 91 | Temp 99.1°F | Resp 16 | Ht 70.0 in | Wt 175.0 lb

## 2017-07-11 DIAGNOSIS — N186 End stage renal disease: Secondary | ICD-10-CM | POA: Insufficient documentation

## 2017-07-11 DIAGNOSIS — Z4931 Encounter for adequacy testing for hemodialysis: Secondary | ICD-10-CM | POA: Insufficient documentation

## 2017-07-11 NOTE — Progress Notes (Signed)
  POST OPERATIVE OFFICE NOTE    CC:  F/u for surgery  HPI:  This is a 52 y.o. male who is s/p Right brachiocephalic AVF and placement of right IJ TDC on 05/24/17 by Dr. Scot Dock. He returns today for follow up.  He states he has done well since surgery.  He dialyzes M/W/F at [facility] on [address].  His catheter is working well. He denies any pain or numbness in his hand.     No Known Allergies  Current Outpatient Medications  Medication Sig Dispense Refill  . amLODipine (NORVASC) 10 MG tablet Take 1 tablet (10 mg total) by mouth daily. 30 tablet 1  . aspirin EC 81 MG EC tablet Take 1 tablet (81 mg total) by mouth daily. 30 tablet 0  . Blood Pressure Monitoring (BLOOD PRESSURE CUFF) MISC Use blood pressure cuff to measure your blood pressure at the same time each day. Record each blood pressure and bring to your primary care physician. 1 each 0  . carvedilol (COREG) 12.5 MG tablet Take 2 tablets (25 mg total) by mouth 2 (two) times daily with a meal. 120 tablet 2  . cefpodoxime (VANTIN) 100 MG tablet Take 1 tablet (100 mg total) by mouth 2 (two) times daily. For 2days 4 tablet 0  . hydrALAZINE (APRESOLINE) 25 MG tablet Take 2 tablets (50 mg total) by mouth 3 (three) times daily.    . sevelamer carbonate (RENVELA) 800 MG tablet Take 2 tablets (1,600 mg total) by mouth 3 (three) times daily with meals. 90 tablet 2  . simvastatin (ZOCOR) 20 MG tablet Take 20 mg by mouth daily.     No current facility-administered medications for this visit.      ROS:  See HPI  Physical Exam: Vitals:   07/11/17 1329  BP: 134/73  Pulse: 91  Resp: 16  Temp: 99.1 F (37.3 C)  SpO2: 99%   Vitals:   07/11/17 1329  Weight: 175 lb (79.4 kg)  Height: 5\' 10"  (1.778 m)    Incision:  Healed nicely Extremities:  Easily palpable right radial and ulnar pulse; fistula is easily palpable; +thrill/bruit present.  Dialysis access duplex 07/11/17: Diameter:  0.35cm-0.47cm Depth:   0.15cm-0.32cm  Tortuous proximal upper arm   Assessment/Plan:  This is a 52 y.o. male who is s/p: Right brachiocephalic AVF and placement of right IJ TDC on 05/24/17 by Dr. Scot Dock  -pt doing well and tolerating dialysis -he does not have any steal sx -his fistula is easily palpable and there is a thrill and bruit, however on duplex, it is measuring 0.35cm to 0.47cm.  We will bring him back in 4 weeks to the [facility] clinic and repeat the duplex.  If it is not maturing, he may need fistulogram to evaluate fistula.  He dialyzes on Wednesday, but can make afternoon appointment.     Leontine Locket, PA-C [facility] [phone]  Clinic MD:  Bridgett Larsson

## 2017-07-17 ENCOUNTER — Other Ambulatory Visit: Payer: Self-pay

## 2017-07-17 DIAGNOSIS — N186 End stage renal disease: Secondary | ICD-10-CM

## 2017-08-01 ENCOUNTER — Other Ambulatory Visit: Payer: Self-pay

## 2017-08-01 ENCOUNTER — Encounter: Payer: Self-pay | Admitting: Physician Assistant

## 2017-08-01 ENCOUNTER — Ambulatory Visit (INDEPENDENT_AMBULATORY_CARE_PROVIDER_SITE_OTHER): Payer: Self-pay | Admitting: Physician Assistant

## 2017-08-01 ENCOUNTER — Other Ambulatory Visit: Payer: Self-pay | Admitting: *Deleted

## 2017-08-01 ENCOUNTER — Ambulatory Visit (HOSPITAL_COMMUNITY)
Admission: RE | Admit: 2017-08-01 | Discharge: 2017-08-01 | Disposition: A | Discharge status: Home / Self Care | Payer: Managed Care, Other (non HMO) | Source: Ambulatory Visit | Attending: Vascular Surgery | Admitting: Vascular Surgery

## 2017-08-01 ENCOUNTER — Encounter: Payer: Self-pay | Admitting: *Deleted

## 2017-08-01 VITALS — BP 130/75 | HR 79 | Resp 20 | Ht 70.0 in | Wt 179.6 lb

## 2017-08-01 DIAGNOSIS — N186 End stage renal disease: Secondary | ICD-10-CM

## 2017-08-01 NOTE — Progress Notes (Signed)
Patient instructed to be at [facility] admitting department at 8 am on 08-09-17 for procedure.

## 2017-08-01 NOTE — Progress Notes (Signed)
Established Dialysis Access   History of Present Illness   Colin [name] is a 52 y.o. ([birth year]) male who presents for re-evaluation for permanent access.  He is status post right brachiocephalic AV fistula creation and right IJ TDC placement by Dr. Scot Dock on 05/24/2017.  He is dialyzing on a Monday Wednesday Friday schedule via right IJ TDC without complication.  Fistula duplex was repeated again today however did not show any progression with maturation in terms of diameter throughout the length of the fistula.  Patient is anxious to have tunneled dialysis catheter removed from right IJ as soon as possible.  He is not taking any blood thinners.  The patient's PMH, PSH, SH, and FamHx were reviewed on today's visit and are unchanged from prior visit.  Current Outpatient Medications  Medication Sig Dispense Refill  . amLODipine (NORVASC) 10 MG tablet Take 1 tablet (10 mg total) by mouth daily. 30 tablet 1  . aspirin EC 81 MG EC tablet Take 1 tablet (81 mg total) by mouth daily. 30 tablet 0  . b complex-vitamin c-folic acid (NEPHRO-VITE) 0.8 MG TABS tablet Take 1 tablet by mouth daily.  3  . Blood Pressure Monitoring (BLOOD PRESSURE CUFF) MISC Use blood pressure cuff to measure your blood pressure at the same time each day. Record each blood pressure and bring to your primary care physician. 1 each 0  . carvedilol (COREG) 12.5 MG tablet Take 2 tablets (25 mg total) by mouth 2 (two) times daily with a meal. 120 tablet 2  . carvedilol (COREG) 25 MG tablet     . cefpodoxime (VANTIN) 100 MG tablet Take 1 tablet (100 mg total) by mouth 2 (two) times daily. For 2days 4 tablet 0  . hydrALAZINE (APRESOLINE) 25 MG tablet Take 2 tablets (50 mg total) by mouth 3 (three) times daily.    . sevelamer carbonate (RENVELA) 800 MG tablet Take 2 tablets (1,600 mg total) by mouth 3 (three) times daily with meals. 90 tablet 2  . simvastatin (ZOCOR) 20 MG tablet Take 20 mg by mouth daily.     No current  facility-administered medications for this visit.     On ROS today: 10 system ROS is negative unless otherwise noted in HPI   Physical Examination   Vitals:   08/01/17 1453  BP: 130/75  Pulse: 79  Resp: 20  SpO2: 98%  Weight: 179 lb 9.6 oz (81.5 kg)  Height: 5\' 10"  (1.778 m)   Body mass index is 25.77 kg/m.  General Alert, O x 3, WD, NAD  Pulmonary Sym exp, good B air movt, CTA B  Cardiac RRR, Nl S1, S2,   Vascular Vessel Right Left  Radial Palpable Palpable  Brachial Palpable Palpable    Musculo- skeletal M/S 5/5 throughout  , Extremities without ischemic changes; palpable thrill near [facility] fossa of right arm; fistula is more pulsatile after narrowing in mid upper arm; palpable right radial pulse; audible bruit throughout length of brachiocephalic fistula    Neurologic Pain and light touch intact in extremities , Motor exam as listed above     Non-invasive Vascular Imaging   right Arm Access Duplex  (08/01/17):   Diameters: 0.38 cm in AC fossa, 0.43 in mid upper arm, 0.47 near axilla  Depth: Less than 0.3cm throughout length of fistula  PSV: Distal upper arm 485 cm/s, mid upper arm 97 cm/s, proximal upper arm 460 cm/s      Medical Decision Making   Colin [name] is  a 52 y.o. male who presents with ESRD requiring hemodialysis.    No progression in diameter of right arm brachial cephalic fistula over the last 4 to 6 weeks  On exam there is an obvious narrowing where fistula loses thrill in mid upper arm  Patient anxious to have tunneled dialysis catheter removed  Plan will be for right arm fistulogram with possible intervention on 08/09/17 by Dr. Brynda Peon of the fistulogram was discussed with the patient including risks as well as benefits and he is willing to proceed   Colin Ligas, PA-C [facility] [place] Office: [phone]

## 2017-08-07 ENCOUNTER — Telehealth: Payer: Self-pay | Admitting: *Deleted

## 2017-08-07 NOTE — Telephone Encounter (Signed)
Patient called to cancel procedure and will call if desires to re-schedule.

## 2017-08-09 ENCOUNTER — Ambulatory Visit (HOSPITAL_COMMUNITY)
Admission: RE | Admit: 2017-08-09 | Payer: Managed Care, Other (non HMO) | Source: Ambulatory Visit | Admitting: Vascular Surgery

## 2017-08-09 ENCOUNTER — Encounter (HOSPITAL_COMMUNITY): Admission: RE | Payer: Self-pay | Source: Ambulatory Visit

## 2017-08-09 SURGERY — A/V FISTULAGRAM
Anesthesia: LOCAL

## 2017-09-04 ENCOUNTER — Telehealth: Payer: Self-pay | Admitting: Vascular Surgery

## 2017-09-04 ENCOUNTER — Telehealth: Payer: Self-pay | Admitting: *Deleted

## 2017-09-04 NOTE — Telephone Encounter (Signed)
Patient called and and after a lengthy discussion came to the conclusion that patient wished to have his right arm HD access seen again before he proceeds with "procedure at the hospital". Was seen in May by Arlee Muslim and agreed to have a fistulogram scheduled. Patient called a few days later and canceled this appointment. He does not want to schedule a fistulogram but insist he was given the option to come back to have arm checked in a month. Which is what he wants to do now. I reminded patient that he had duplex x 2 and notes indicted to proceed with fistulogram. He will consider this only after he is seen in office. Will schedule appointment with PA.

## 2017-09-04 NOTE — Telephone Encounter (Signed)
sch appt spk to pt 09/12/17 2pm f/u PA

## 2017-09-12 ENCOUNTER — Encounter: Payer: Self-pay | Admitting: *Deleted

## 2017-09-12 ENCOUNTER — Other Ambulatory Visit: Payer: Self-pay | Admitting: *Deleted

## 2017-09-12 ENCOUNTER — Ambulatory Visit (INDEPENDENT_AMBULATORY_CARE_PROVIDER_SITE_OTHER): Payer: Managed Care, Other (non HMO) | Admitting: Physician Assistant

## 2017-09-12 VITALS — BP 132/75 | HR 86 | Temp 99.8°F | Resp 20 | Ht 70.0 in | Wt 183.6 lb

## 2017-09-12 DIAGNOSIS — N186 End stage renal disease: Secondary | ICD-10-CM | POA: Diagnosis not present

## 2017-09-12 NOTE — Progress Notes (Signed)
HISTORY AND PHYSICAL     CC:  Recheck his fistula Requesting Provider:  Josetta Huddle, MD  HPI: This is a 52 y.o. male who was originally seen in the hospital in March 2019 with new ESRD requiring dialysis that was caused by hypertension and medical non compliance.  He was taken to the operating room on 05/24/17 and underwent a right brachiocephalic AVF and conversion of temporary right IJ catheter to a right IJ 23cm tunneled dialysis catheter by Dr. Scot Dock.  He was seen back on April 24 with duplex of his fistula and it was not maturing adequately and measured 0.35cm-0.47cm.  He was brought back 4 weeks later and fistula was still not maturing and he was scheduled for a fistulogram on 08/09/17.  The pt called to cancel this.  He called back last week and stated he wanted to be seen again prior to procedure.  He hasn't had any changes since he was here last.  His BP is under good control.   He dialyzes M/W/F at [facility] on [address].    He has a hx of HTN, repaired descending aortic aneurysm.    Past Medical History:  Diagnosis Date  . Descending aortic aneurysm ([place]) 2010   repaired by Dr. Gilford Raid  . ETOH abuse   . 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 Hypertension   . Thyroid nodule     Past Surgical History:  Procedure Laterality Date  . AV FISTULA PLACEMENT Right 05/24/2017   Procedure: ARTERIOVENOUS (AV) FISTULA CREATION;  Surgeon: Angelia Mould, MD;  Location: [place];  Service: Vascular;  Laterality: Right;  . CARDIAC SURGERY     pt unsure of actual Sx - knows it was due to "anurysm"  . INSERTION OF DIALYSIS CATHETER N/A 05/24/2017   Procedure: INSERTION OF DIALYSIS CATHETER;  Surgeon: Angelia Mould, MD;  Location: [place];  Service: Vascular;  Laterality: N/A;  . IR FLUORO GUIDE CV LINE RIGHT  05/22/2017  . IR US GUIDE VASC ACCESS RIGHT  05/22/2017  . THROAT SURGERY      No Known Allergies  Current Outpatient Medications  Medication Sig  Dispense Refill  . amLODipine (NORVASC) 10 MG tablet Take 1 tablet (10 mg total) by mouth daily. 30 tablet 1  . aspirin EC 81 MG EC tablet Take 1 tablet (81 mg total) by mouth daily. 30 tablet 0  . b complex-vitamin c-folic acid (NEPHRO-VITE) 0.8 MG TABS tablet Take 1 tablet by mouth daily.  3  . Blood Pressure Monitoring (BLOOD PRESSURE CUFF) MISC Use blood pressure cuff to measure your blood pressure at the same time each day. Record each blood pressure and bring to your primary care physician. 1 each 0  . carvedilol (COREG) 12.5 MG tablet Take 2 tablets (25 mg total) by mouth 2 (two) times daily with a meal. 120 tablet 2  . carvedilol (COREG) 25 MG tablet     . cefpodoxime (VANTIN) 100 MG tablet Take 1 tablet (100 mg total) by mouth 2 (two) times daily. For 2days 4 tablet 0  . hydrALAZINE (APRESOLINE) 25 MG tablet Take 2 tablets (50 mg total) by mouth 3 (three) times daily.    . sevelamer carbonate (RENVELA) 800 MG tablet Take 2 tablets (1,600 mg total) by mouth 3 (three) times daily with meals. 90 tablet 2  . simvastatin (ZOCOR) 20 MG tablet Take 20 mg by mouth daily.     No current facility-administered medications for this visit.  Family History  Problem Relation Age of Onset  . Diabetes Mother   . Hypertension Father   . Diabetes Sister     Social History   Socioeconomic History  . Marital status: Married    Spouse name: Not on file  . Number of children: Not on file  . Years of education: Not on file  . Highest education level: Not on file  Occupational History  . Occupation: Building services engineer  Social Needs  . Financial resource strain: Not on file  . Food insecurity:    Worry: Not on file    Inability: Not on file  . Transportation needs:    Medical: Not on file    Non-medical: Not on file  Tobacco Use  . Smoking status: Never Smoker  . Smokeless tobacco: Never Used  Substance and Sexual Activity  . Alcohol use: Yes    Alcohol/week: 3.0 oz    Types: 5 Cans  of beer per week    Comment: previousy reported 35 beers a week, but now maybe 1-2 bottles on weekend days  . Drug use: No  . Sexual activity: Not on file  Lifestyle  . Physical activity:    Days per week: Not on file    Minutes per session: Not on file  . Stress: Not on file  Relationships  . Social connections:    Talks on phone: Not on file    Gets together: Not on file    Attends religious service: Not on file    Active member of club or organization: Not on file    Attends meetings of clubs or organizations: Not on file    Relationship status: Not on file  . Intimate partner violence:    Fear of current or ex partner: Not on file    Emotionally abused: Not on file    Physically abused: Not on file    Forced sexual activity: Not on file  Other Topics Concern  . Not on file  Social History Narrative  . Not on file     REVIEW OF SYSTEMS:   [X]  denotes positive finding, [ ]  denotes negative finding Cardiac  Comments:  Chest pain or chest pressure:    Shortness of breath upon exertion:    Short of breath when lying flat:    Irregular heart rhythm:        Vascular    Pain in calf, thigh, or hip brought on by ambulation:    Pain in feet at night that wakes you up from your sleep:     Blood clot in your veins:    Leg swelling:         Pulmonary    Oxygen at home:    Productive cough:     Wheezing:         Neurologic    Sudden weakness in arms or legs:     Sudden numbness in arms or legs:     Sudden onset of difficulty speaking or slurred speech:    Temporary loss of vision in one eye:     Problems with dizziness:         Gastrointestinal    Blood in stool:     Vomited blood:         Genitourinary    Burning when urinating:     Blood in urine:        Psychiatric    Major depression:         Hematologic  Bleeding problems:    Problems with blood clotting too easily:        Skin    Rashes or ulcers:        Constitutional    Fever or chills:       PHYSICAL EXAMINATION:  Vitals:   09/12/17 1337  BP: 132/75  Pulse: 86  Resp: 20  Temp: 99.8 F (37.7 C)  SpO2: 98%   Vitals:   09/12/17 1337  Weight: 183 lb 9.6 oz (83.3 kg)  Height: 5\' 10"  (1.778 m)   Body mass index is 26.34 kg/m.  General:  WDWN in NAD; vital signs documented above Gait: Normal HENT: WNL, normocephalic Pulmonary: normal non-labored breathing  Cardiac: regular HR Skin: without rashes; incision on right antecubital space is well healed Vascular Exam/Pulses: There is a thrill/bruit within the fistula; the fistula is somewhat pulsatile distally beyond the anastomosis. Extremities: 2+ right radial pulse Musculoskeletal: no muscle wasting or atrophy  Neurologic: A&O X 3;  No focal weakness or paresthesias are detected Psychiatric:  The pt has Normal affect.   Non-Invasive Vascular Imaging:   None today Dialysis duplex 08/01/17: Fistula measured 0.38cm-0.47cm Depth:  0.19cm-0.83cm  Pt meds includes: Statin:  Yes.   Beta Blocker:  Yes.   Aspirin:  Yes.   ACEI:  No. ARB:  No. CCB use:  Yes Other Antiplatelet/Anticoagulant:  No   ASSESSMENT/PLAN:: 52 y.o. male who is s/p right BC AVF that is not maturing and presents today for further evaluation.   -pt dialyzes M/W/F and his [facility] is working well -his fistula does not appear to be maturing and is pulsatile beyond the anastomosis.  Again discussed undergoing fistulogram to evaluate fistula with possible intervention to help the fistula mature.  He expresses understanding and is willing to proceed.  Most likely next week or the week after on a T/T.  He is not on any blood thinners.    Leontine Locket, PA-C [facility] [phone]  Clinic MD:  Scot Dock

## 2017-09-12 NOTE — H&P (View-Only) (Signed)
HISTORY AND PHYSICAL     CC:  Recheck his fistula Requesting Provider:  Josetta Huddle, MD  HPI: This is a 52 y.o. male who was originally seen in the hospital in March 2019 with new ESRD requiring dialysis that was caused by hypertension and medical non compliance.  He was taken to the operating room on 05/24/17 and underwent a right brachiocephalic AVF and conversion of temporary right IJ catheter to a right IJ 23cm tunneled dialysis catheter by Dr. Scot Dock.  He was seen back on April 24 with duplex of his fistula and it was not maturing adequately and measured 0.35cm-0.47cm.  He was brought back 4 weeks later and fistula was still not maturing and he was scheduled for a fistulogram on 08/09/17.  The pt called to cancel this.  He called back last week and stated he wanted to be seen again prior to procedure.  He hasn't had any changes since he was here last.  His BP is under good control.   He dialyzes M/W/F at [facility] on [address].    He has a hx of HTN, repaired descending aortic aneurysm.    Past Medical History:  Diagnosis Date  . Descending aortic aneurysm ([place]) 2010   repaired by Dr. Gilford Raid  . ETOH abuse   . 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 Hypertension   . Thyroid nodule     Past Surgical History:  Procedure Laterality Date  . AV FISTULA PLACEMENT Right 05/24/2017   Procedure: ARTERIOVENOUS (AV) FISTULA CREATION;  Surgeon: Angelia Mould, MD;  Location: [place];  Service: Vascular;  Laterality: Right;  . CARDIAC SURGERY     pt unsure of actual Sx - knows it was due to "anurysm"  . INSERTION OF DIALYSIS CATHETER N/A 05/24/2017   Procedure: INSERTION OF DIALYSIS CATHETER;  Surgeon: Angelia Mould, MD;  Location: [place];  Service: Vascular;  Laterality: N/A;  . IR FLUORO GUIDE CV LINE RIGHT  05/22/2017  . IR US GUIDE VASC ACCESS RIGHT  05/22/2017  . THROAT SURGERY      No Known Allergies  Current Outpatient Medications  Medication Sig  Dispense Refill  . amLODipine (NORVASC) 10 MG tablet Take 1 tablet (10 mg total) by mouth daily. 30 tablet 1  . aspirin EC 81 MG EC tablet Take 1 tablet (81 mg total) by mouth daily. 30 tablet 0  . b complex-vitamin c-folic acid (NEPHRO-VITE) 0.8 MG TABS tablet Take 1 tablet by mouth daily.  3  . Blood Pressure Monitoring (BLOOD PRESSURE CUFF) MISC Use blood pressure cuff to measure your blood pressure at the same time each day. Record each blood pressure and bring to your primary care physician. 1 each 0  . carvedilol (COREG) 12.5 MG tablet Take 2 tablets (25 mg total) by mouth 2 (two) times daily with a meal. 120 tablet 2  . carvedilol (COREG) 25 MG tablet     . cefpodoxime (VANTIN) 100 MG tablet Take 1 tablet (100 mg total) by mouth 2 (two) times daily. For 2days 4 tablet 0  . hydrALAZINE (APRESOLINE) 25 MG tablet Take 2 tablets (50 mg total) by mouth 3 (three) times daily.    . sevelamer carbonate (RENVELA) 800 MG tablet Take 2 tablets (1,600 mg total) by mouth 3 (three) times daily with meals. 90 tablet 2  . simvastatin (ZOCOR) 20 MG tablet Take 20 mg by mouth daily.     No current facility-administered medications for this visit.  Family History  Problem Relation Age of Onset  . Diabetes Mother   . Hypertension Father   . Diabetes Sister     Social History   Socioeconomic History  . Marital status: Married    Spouse name: Not on file  . Number of children: Not on file  . Years of education: Not on file  . Highest education level: Not on file  Occupational History  . Occupation: Building services engineer  Social Needs  . Financial resource strain: Not on file  . Food insecurity:    Worry: Not on file    Inability: Not on file  . Transportation needs:    Medical: Not on file    Non-medical: Not on file  Tobacco Use  . Smoking status: Never Smoker  . Smokeless tobacco: Never Used  Substance and Sexual Activity  . Alcohol use: Yes    Alcohol/week: 3.0 oz    Types: 5 Cans  of beer per week    Comment: previousy reported 35 beers a week, but now maybe 1-2 bottles on weekend days  . Drug use: No  . Sexual activity: Not on file  Lifestyle  . Physical activity:    Days per week: Not on file    Minutes per session: Not on file  . Stress: Not on file  Relationships  . Social connections:    Talks on phone: Not on file    Gets together: Not on file    Attends religious service: Not on file    Active member of club or organization: Not on file    Attends meetings of clubs or organizations: Not on file    Relationship status: Not on file  . Intimate partner violence:    Fear of current or ex partner: Not on file    Emotionally abused: Not on file    Physically abused: Not on file    Forced sexual activity: Not on file  Other Topics Concern  . Not on file  Social History Narrative  . Not on file     REVIEW OF SYSTEMS:   [X]  denotes positive finding, [ ]  denotes negative finding Cardiac  Comments:  Chest pain or chest pressure:    Shortness of breath upon exertion:    Short of breath when lying flat:    Irregular heart rhythm:        Vascular    Pain in calf, thigh, or hip brought on by ambulation:    Pain in feet at night that wakes you up from your sleep:     Blood clot in your veins:    Leg swelling:         Pulmonary    Oxygen at home:    Productive cough:     Wheezing:         Neurologic    Sudden weakness in arms or legs:     Sudden numbness in arms or legs:     Sudden onset of difficulty speaking or slurred speech:    Temporary loss of vision in one eye:     Problems with dizziness:         Gastrointestinal    Blood in stool:     Vomited blood:         Genitourinary    Burning when urinating:     Blood in urine:        Psychiatric    Major depression:         Hematologic  Bleeding problems:    Problems with blood clotting too easily:        Skin    Rashes or ulcers:        Constitutional    Fever or chills:       PHYSICAL EXAMINATION:  Vitals:   09/12/17 1337  BP: 132/75  Pulse: 86  Resp: 20  Temp: 99.8 F (37.7 C)  SpO2: 98%   Vitals:   09/12/17 1337  Weight: 183 lb 9.6 oz (83.3 kg)  Height: 5\' 10"  (1.778 m)   Body mass index is 26.34 kg/m.  General:  WDWN in NAD; vital signs documented above Gait: Normal HENT: WNL, normocephalic Pulmonary: normal non-labored breathing  Cardiac: regular HR Skin: without rashes; incision on right antecubital space is well healed Vascular Exam/Pulses: There is a thrill/bruit within the fistula; the fistula is somewhat pulsatile distally beyond the anastomosis. Extremities: 2+ right radial pulse Musculoskeletal: no muscle wasting or atrophy  Neurologic: A&O X 3;  No focal weakness or paresthesias are detected Psychiatric:  The pt has Normal affect.   Non-Invasive Vascular Imaging:   None today Dialysis duplex 08/01/17: Fistula measured 0.38cm-0.47cm Depth:  0.19cm-0.83cm  Pt meds includes: Statin:  Yes.   Beta Blocker:  Yes.   Aspirin:  Yes.   ACEI:  No. ARB:  No. CCB use:  Yes Other Antiplatelet/Anticoagulant:  No   ASSESSMENT/PLAN:: 52 y.o. male who is s/p right BC AVF that is not maturing and presents today for further evaluation.   -pt dialyzes M/W/F and his [facility] is working well -his fistula does not appear to be maturing and is pulsatile beyond the anastomosis.  Again discussed undergoing fistulogram to evaluate fistula with possible intervention to help the fistula mature.  He expresses understanding and is willing to proceed.  Most likely next week or the week after on a T/T.  He is not on any blood thinners.    Leontine Locket, PA-C [facility] [phone]  Clinic MD:  Scot Dock

## 2017-09-18 ENCOUNTER — Telehealth: Payer: Self-pay | Admitting: Vascular Surgery

## 2017-09-18 ENCOUNTER — Encounter (HOSPITAL_COMMUNITY): Payer: Self-pay | Admitting: Surgery

## 2017-09-18 ENCOUNTER — Encounter (HOSPITAL_COMMUNITY)
Admission: RE | Disposition: A | Discharge status: Home / Self Care | Payer: Self-pay | Source: Ambulatory Visit | Attending: Surgery

## 2017-09-18 ENCOUNTER — Ambulatory Visit (HOSPITAL_COMMUNITY)
Admission: RE | Admit: 2017-09-18 | Discharge: 2017-09-18 | Disposition: A | Discharge status: Home / Self Care | Payer: Managed Care, Other (non HMO) | Source: Ambulatory Visit | Attending: Surgery | Admitting: Surgery

## 2017-09-18 DIAGNOSIS — Z7982 Long term (current) use of aspirin: Secondary | ICD-10-CM | POA: Insufficient documentation

## 2017-09-18 DIAGNOSIS — I120 Hypertensive chronic kidney disease with stage 5 chronic kidney disease or end stage renal disease: Secondary | ICD-10-CM | POA: Insufficient documentation

## 2017-09-18 DIAGNOSIS — T82858A Stenosis of vascular prosthetic devices, implants and grafts, initial encounter: Secondary | ICD-10-CM | POA: Insufficient documentation

## 2017-09-18 DIAGNOSIS — E213 Hyperparathyroidism, unspecified: Secondary | ICD-10-CM | POA: Diagnosis not present

## 2017-09-18 DIAGNOSIS — Y832 Surgical operation with anastomosis, bypass or graft as the cause of abnormal reaction of the patient, or of later complication, without mention of misadventure at the time of the procedure: Secondary | ICD-10-CM | POA: Insufficient documentation

## 2017-09-18 DIAGNOSIS — N186 End stage renal disease: Secondary | ICD-10-CM | POA: Insufficient documentation

## 2017-09-18 DIAGNOSIS — Z9114 Patient's other noncompliance with medication regimen: Secondary | ICD-10-CM | POA: Diagnosis not present

## 2017-09-18 DIAGNOSIS — T82898A Other specified complication of vascular prosthetic devices, implants and grafts, initial encounter: Secondary | ICD-10-CM | POA: Diagnosis not present

## 2017-09-18 DIAGNOSIS — M899 Disorder of bone, unspecified: Secondary | ICD-10-CM | POA: Insufficient documentation

## 2017-09-18 HISTORY — PX: PERIPHERAL VASCULAR BALLOON ANGIOPLASTY: CATH118281

## 2017-09-18 HISTORY — PX: A/V FISTULAGRAM: CATH118298

## 2017-09-18 LAB — POCT I-STAT, CHEM 8
BUN: 39 mg/dL — ABNORMAL HIGH (ref 6–20)
CALCIUM ION: 1.17 mmol/L (ref 1.15–1.40)
CREATININE: 10.3 mg/dL — AB (ref 0.61–1.24)
Chloride: 96 mmol/L — ABNORMAL LOW (ref 98–111)
GLUCOSE: 86 mg/dL (ref 70–99)
HCT: 39 % (ref 39.0–52.0)
HEMOGLOBIN: 13.3 g/dL (ref 13.0–17.0)
POTASSIUM: 3.7 mmol/L (ref 3.5–5.1)
Sodium: 136 mmol/L (ref 135–145)
TCO2: 28 mmol/L (ref 22–32)

## 2017-09-18 SURGERY — A/V FISTULAGRAM
Anesthesia: LOCAL | Laterality: Right

## 2017-09-18 MED ORDER — LIDOCAINE HCL (PF) 1 % IJ SOLN
INTRAMUSCULAR | Status: AC
  Filled 2017-09-18: qty 30

## 2017-09-18 MED ORDER — SODIUM CHLORIDE 0.9% FLUSH: 3.0000 mL | Freq: Two times a day (BID) | INTRAVENOUS | Status: DC

## 2017-09-18 MED ORDER — SODIUM CHLORIDE 0.9 % IV SOLN: 250.0000 mL | INTRAVENOUS | Status: DC | PRN

## 2017-09-18 MED ORDER — HEPARIN (PORCINE) IN NACL 2-0.9 UNITS/ML
INTRAMUSCULAR | Status: AC | PRN
  Administered 2017-09-18: 500 mL

## 2017-09-18 MED ORDER — LIDOCAINE HCL (PF) 1 % IJ SOLN
INTRAMUSCULAR | Status: DC | PRN
  Administered 2017-09-18: 2 mL

## 2017-09-18 MED ORDER — IODIXANOL 320 MG/ML IV SOLN
INTRAVENOUS | Status: DC | PRN
  Administered 2017-09-18: 30 mL

## 2017-09-18 MED ORDER — SODIUM CHLORIDE 0.9% FLUSH: 3.0000 mL | INTRAVENOUS | Status: DC | PRN

## 2017-09-18 MED ORDER — HEPARIN (PORCINE) IN NACL 1000-0.9 UT/500ML-% IV SOLN
INTRAVENOUS | Status: AC
  Filled 2017-09-18: qty 500

## 2017-09-18 SURGICAL SUPPLY — 16 items
BAG SNAP BAND KOVER 36X36 (MISCELLANEOUS) ×3 IMPLANT
BALLN MUSTANG 5.0X40 75 (BALLOONS) ×3
BALLOON MUSTANG 5.0X40 75 (BALLOONS) ×2 IMPLANT
CATH ANGIO 5F BER2 65CM (CATHETERS) ×3 IMPLANT
COVER DOME SNAP 22 D (MISCELLANEOUS) ×3 IMPLANT
COVER PRB 48X5XTLSCP FOLD TPE (BAG) ×2 IMPLANT
COVER PROBE 5X48 (BAG) ×1
KIT ENCORE 26 ADVANTAGE (KITS) ×3 IMPLANT
KIT MICROPUNCTURE NIT STIFF (SHEATH) ×6 IMPLANT
PROTECTION STATION PRESSURIZED (MISCELLANEOUS) ×3
SHEATH PINNACLE R/O II 6F 4CM (SHEATH) ×3 IMPLANT
STATION PROTECTION PRESSURIZED (MISCELLANEOUS) ×2 IMPLANT
STOPCOCK MORSE 400PSI 3WAY (MISCELLANEOUS) ×3 IMPLANT
TRAY PV CATH (CUSTOM PROCEDURE TRAY) ×3 IMPLANT
TUBING CIL FLEX 10 FLL-RA (TUBING) ×3 IMPLANT
WIRE BENTSON .035X145CM (WIRE) ×3 IMPLANT

## 2017-09-18 NOTE — CV Procedure (Signed)
    Patient name: Colin [name] MRN: [id number] DOB: 05-May-1965 Sex: male  09/18/2017 Pre-operative Diagnosis: Non-maturing right brachiocephalic fistula Post-operative diagnosis:  Same Surgeon:  Annamarie Major Procedure Performed:  1.  Ultrasound-guided access, right cephalic vein  2.  Fistulogram  3.  Angioplasty, right cephalic vein (peripheral)    Indications: Patient is a non-maturing fistula.  Ultrasound identified an area in the proximal upper arm that was causing flow disturbances.  He is here for evaluation  Procedure:  The patient was identified in the holding area and taken to room 8.  The patient was then placed supine on the table and prepped and draped in the usual sterile fashion.  A time out was called.  Ultrasound was used to evaluate the fistula.  The vein was patent and compressible.  A digital ultrasound image was acquired.  The fistula was then accessed under ultrasound guidance using a micropuncture needle.  An 018 wire was then asvanced without resistance and a micropuncture sheath was placed.  Contrast injections were then performed through the sheath.  Findings: The central venous system is widely patent.  There is mild luminal narrowing from the catheter.  The arterial venous anastomosis is patent.  The vein looks somewhat sclerotic at this area.  There is some tortuosity within the vein in the upper arm.  I believe the area seen on ultrasound correlates with an area of a sharp turn within the vein.   Intervention: After the above images were acquired the decision was made to proceed with intervention.  I did not feel like the kink in the upper arm was associated with significant stenosis however since the fistula was not maturing and because this area was identified on ultrasound, I elected to treated with balloon angioplasty.  A 035 wire was advanced across the lesion.  A 6 French sheath was placed.  I then selected a 5 x 40 Mustang balloon and performed balloon  angioplasty across the area of narrowing.  The balloon was taken to nominal pressure for 1 minute.  Follow-up imaging showed slight improvement in the filling across this area.  Catheters and wires were removed.  Pursestring suture was used for closure.  Impression:  #1 no central venous stenosis  #2 arterial venous anastomosis is patent however the vein at this area is a little sclerotic.  Consider surgical revision.  #3 area of irregularity in the right upper arm at the level of concern identified by ultrasound was more of a kink.  I elected to treat this with balloon angioplasty using a 5 mm balloon.  There did appear to be better filling across this lesion after intervention.     Theotis Burrow, M.D. [facility] [place] Office: [phone] Pager:  [phone]

## 2017-09-18 NOTE — Discharge Instructions (Signed)

## 2017-09-18 NOTE — Telephone Encounter (Signed)
sch appt spk to pt 10/05/17 4pm Dialysis Duplex 10/08/17 330pm f/u MD

## 2017-09-18 NOTE — Interval H&P Note (Signed)
History and Physical Interval Note:  09/18/2017 10:16 AM  Colin [name]  has presented today for surgery, with the diagnosis of poor flow  The various methods of treatment have been discussed with the patient and family. After consideration of risks, benefits and other options for treatment, the patient has consented to  Procedure(s): A/V FISTULAGRAM - Right Arm (N/A) as a surgical intervention .  The patient's history has been reviewed, patient examined, no change in status, stable for surgery.  I have reviewed the patient's chart and labs.  Questions were answered to the patient's satisfaction.     Annamarie Major

## 2017-09-19 ENCOUNTER — Other Ambulatory Visit: Payer: Self-pay

## 2017-09-19 DIAGNOSIS — N186 End stage renal disease: Secondary | ICD-10-CM

## 2017-09-19 DIAGNOSIS — Z48812 Encounter for surgical aftercare following surgery on the circulatory system: Secondary | ICD-10-CM

## 2017-10-05 ENCOUNTER — Ambulatory Visit (HOSPITAL_COMMUNITY)
Admission: RE | Admit: 2017-10-05 | Discharge: 2017-10-05 | Disposition: A | Discharge status: Home / Self Care | Payer: Managed Care, Other (non HMO) | Source: Ambulatory Visit | Attending: Family | Admitting: Family

## 2017-10-05 DIAGNOSIS — N186 End stage renal disease: Secondary | ICD-10-CM | POA: Diagnosis not present

## 2017-10-05 DIAGNOSIS — Z48812 Encounter for surgical aftercare following surgery on the circulatory system: Secondary | ICD-10-CM

## 2017-10-05 DIAGNOSIS — I770 Arteriovenous fistula, acquired: Secondary | ICD-10-CM | POA: Diagnosis not present

## 2017-10-08 ENCOUNTER — Ambulatory Visit (INDEPENDENT_AMBULATORY_CARE_PROVIDER_SITE_OTHER): Payer: Managed Care, Other (non HMO) | Admitting: Surgery

## 2017-10-08 ENCOUNTER — Encounter: Payer: Self-pay | Admitting: Surgery

## 2017-10-08 VITALS — BP 114/6 | HR 86 | Resp 20 | Ht 70.0 in | Wt 180.0 lb

## 2017-10-08 DIAGNOSIS — N186 End stage renal disease: Secondary | ICD-10-CM | POA: Diagnosis not present

## 2017-10-08 NOTE — Progress Notes (Signed)
HISTORY AND PHYSICAL     CC:  Follow up fistulogram Requesting Provider:  Josetta Huddle, MD  HPI: This is a 52 y.o. male who was originally seen in the hospital in March 2019 with new ESRD requiring HD that was caused by HTN and medical non compliance.  He underwent a d right brachiocephalic AVF creation and conversion of temporary IJ catheter to a right IJ 23cm TDC by Dr. Scot Dock.  He was seen back on 07/11/17 with duplex that revealed it was not maturing adequately.  He was subsequently scheduled for a fistulogram and underwent this on 09/18/17 by Dr. Trula Slade.    He was found to have an area of  irregularity in the right upper arm at the level of concern identified by ultrasound was more of a kink.  I elected to treat this with balloon angioplasty using a 5 mm balloon.  There did appear to be better filling across this lesion after intervention.  Also, there was no central venous stenosis and the arterial venous anastomosis is patent however the vein at this area is a little sclerotic.  Could consider surgical revision.  Pt presents today and is doing well since the fistulogram.  He dialyzes M.W.F at the [facility].     Past Medical History:  Diagnosis Date  . 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 Gilford Raid  . ETOH abuse   . 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 Hypertension   . Thyroid nodule     Past Surgical History:  Procedure Laterality Date  . A/V FISTULAGRAM N/A 09/18/2017   Procedure: A/V FISTULAGRAM - Right Arm;  Surgeon: Serafina Mitchell, MD;  Location: [facility];  Service: Cardiovascular;  Laterality: N/A;  . AV FISTULA PLACEMENT Right 05/24/2017   Procedure: ARTERIOVENOUS (AV) FISTULA CREATION;  Surgeon: Angelia Mould, MD;  Location: [facility];  Service: Vascular;  Laterality: Right;  . CARDIAC SURGERY     pt unsure of actual Sx - knows it was due to "anurysm"  . INSERTION OF DIALYSIS CATHETER N/A 05/24/2017   Procedure: INSERTION OF DIALYSIS  CATHETER;  Surgeon: Angelia Mould, MD;  Location: [facility];  Service: Vascular;  Laterality: N/A;  . IR FLUORO GUIDE CV LINE RIGHT  05/22/2017  . IR US GUIDE VASC ACCESS RIGHT  05/22/2017  . PERIPHERAL VASCULAR BALLOON ANGIOPLASTY Right 09/18/2017   Procedure: PERIPHERAL VASCULAR BALLOON ANGIOPLASTY;  Surgeon: Serafina Mitchell, MD;  Location: [facility];  Service: Cardiovascular;  Laterality: Right;  fistulagram  . THROAT SURGERY      No Known Allergies  Current Outpatient Medications  Medication Sig Dispense Refill  . amLODipine (NORVASC) 10 MG tablet Take 1 tablet (10 mg total) by mouth daily. 30 tablet 1  . aspirin EC 81 MG EC tablet Take 1 tablet (81 mg total) by mouth daily. 30 tablet 0  . b complex-vitamin c-folic acid (NEPHRO-VITE) 0.8 MG TABS tablet Take 1 tablet by mouth daily.  3  . carvedilol (COREG) 12.5 MG tablet Take 2 tablets (25 mg total) by mouth 2 (two) times daily with a meal. 120 tablet 2  . hydrALAZINE (APRESOLINE) 25 MG tablet Take 2 tablets (50 mg total) by mouth 3 (three) times daily. (Patient taking differently: Take 50 mg by mouth 2 (two) times daily. )    . SENSIPAR 90 MG tablet Take 90 mg by mouth every evening.     . sevelamer carbonate (RENVELA) 800 MG tablet Take  2 tablets (1,600 mg total) by mouth 3 (three) times daily with meals. (Patient taking differently: Take 800-1,600 mg by mouth See admin instructions. Take 1600 mg by mouth 3 times daily with meals and take 800 mg by mouth with snacks) 90 tablet 2  . simvastatin (ZOCOR) 20 MG tablet Take 20 mg by mouth every evening.     . Blood Pressure Monitoring (BLOOD PRESSURE CUFF) MISC Use blood pressure cuff to measure your blood pressure at the same time each day. Record each blood pressure and bring to your primary care physician. (Patient not taking: Reported on 09/13/2017) 1 each 0   No current facility-administered medications for this visit.     Family History  Problem Relation Age of Onset  .  Diabetes Mother   . Hypertension Father   . Diabetes Sister     Social History   Socioeconomic History  . Marital status: Married    Spouse name: Not on file  . Number of children: Not on file  . Years of education: Not on file  . Highest education level: Not on file  Occupational History  . Occupation: Building services engineer  Social Needs  . Financial resource strain: Not on file  . Food insecurity:    Worry: Not on file    Inability: Not on file  . Transportation needs:    Medical: Not on file    Non-medical: Not on file  Tobacco Use  . Smoking status: Never Smoker  . Smokeless tobacco: Never Used  Substance and Sexual Activity  . Alcohol use: Yes    Alcohol/week: 3.0 oz    Types: 5 Cans of beer per week    Comment: previousy reported 35 beers a week, but now maybe 1-2 bottles on weekend days  . Drug use: No  . Sexual activity: Not on file  Lifestyle  . Physical activity:    Days per week: Not on file    Minutes per session: Not on file  . Stress: Not on file  Relationships  . Social connections:    Talks on phone: Not on file    Gets together: Not on file    Attends religious service: Not on file    Active member of club or organization: Not on file    Attends meetings of clubs or organizations: Not on file    Relationship status: Not on file  . Intimate partner violence:    Fear of current or ex partner: Not on file    Emotionally abused: Not on file    Physically abused: Not on file    Forced sexual activity: Not on file  Other Topics Concern  . Not on file  Social History Narrative  . Not on file     REVIEW OF SYSTEMS:   [X]  denotes positive finding, [ ]  denotes negative finding Cardiac  Comments:  Chest pain or chest pressure:    Shortness of breath upon exertion:    Short of breath when lying flat:    Irregular heart rhythm:        Vascular    Pain in calf, thigh, or hip brought on by ambulation:    Pain in feet at night that wakes you up  from your sleep:     Blood clot in your veins:    Leg swelling:         Pulmonary    Oxygen at home:    Productive cough:     Wheezing:  Neurologic    Sudden weakness in arms or legs:     Sudden numbness in arms or legs:     Sudden onset of difficulty speaking or slurred speech:    Temporary loss of vision in one eye:     Problems with dizziness:         Gastrointestinal    Blood in stool:     Vomited blood:         Genitourinary    Burning when urinating:     Blood in urine:        Psychiatric    Major depression:         Hematologic    Bleeding problems:    Problems with blood clotting too easily:        Skin    Rashes or ulcers:        Constitutional    Fever or chills:      PHYSICAL EXAMINATION:  Vitals:   10/08/17 1536  BP: (!) 114/6  Pulse: 86  Resp: 20  SpO2: 100%   Vitals:   10/08/17 1536  Weight: 180 lb (81.6 kg)  Height: 5\' 10"  (1.778 m)   Body mass index is 25.83 kg/m.  General:  WDWN in NAD; vital signs documented above Gait: Normal HENT: WNL, normocephalic Pulmonary: normal non-labored breathing , without Rales, rhonchi,  wheezing Cardiac: regular HR Skin: without rashes Vascular Exam/Pulses: +palpable right radial pulse Extremities: the fistula is easily palpable; it is somewhat tortuous midway up the arm but otherwise looks good.  Musculoskeletal: no muscle wasting or atrophy  Neurologic: A&O X 3;  No focal weakness or paresthesias are detected Psychiatric:  The pt has Normal affect.   Non-Invasive Vascular Imaging:   Findings: +--------------------+----------+-----------------+--------+ AVF         PSV (cm/s)Flow Vol (mL/min)Comments +--------------------+----------+-----------------+--------+ Native artery inflow  111      155         +--------------------+----------+-----------------+--------+ AVF Anastomosis     389                 +--------------------+----------+-----------------+--------+   +------------+----------+-------------+----------+--------+ OUTFLOW VEINPSV (cm/s)Diameter (cm)Depth (cm)Describe +------------+----------+-------------+----------+--------+ Prox UA     129    0.58     0.36       +------------+----------+-------------+----------+--------+ Mid UA     119    0.42     0.18       +------------+----------+-------------+----------+--------+ Dist UA     195    0.55     0.19       +------------+----------+-------------+----------+--------+ AC Fossa    282    0.67     0.17       +------------+----------+-------------+----------+--------+  Pt meds includes: Statin:  Yes.   Beta Blocker:  Yes.   Aspirin:  Yes.   ACEI:  No. ARB:  No. CCB use:  Yes Other Antiplatelet/Anticoagulant:  No   ASSESSMENT/PLAN:: 52 y.o. male with ESRD on dialysis M[facility]F [facility] returns today for follow up to fistulogram.   -pt's dialysis duplex reviewed and pt examined by Dr. Arita Miss and he feels the fistula is ready to be used.  There is an area of tortuosity midway up the fistula that probably should be avoided.  He discussed this with the pt.   -there is an area that is a little sclerotic at the anastomosis and if not able to use, may consider surgical revision in the future.   -May start trying to a access the fistula at the pt's next  dialysis session.  Can schedule to have R[place]ins removed once the fistula is able to be used 2-3 times successfully.     Leontine Locket, PA-C [facility] [phone]  Clinic MD:  Pt seen and examined with Dr. Trula Slade   There is a good thrill within the patient's right brachiocephalic fistula.  It is somewhat tortuous.  He has recently undergone fistulogram with angioplasty.  Based on the ultrasound today, I feel it is reasonable to try and cannulate the fistula to see  if it is usable.  If they have difficulty with access, I would consider revision of the proximal anastomosis, and potentially straightening out the tortuous section of the fistula in that arm.  Annamarie Major

## 2017-10-16 ENCOUNTER — Emergency Department (HOSPITAL_COMMUNITY)
Admission: EM | Admit: 2017-10-16 | Discharge: 2017-10-16 | Disposition: A | Discharge status: Home / Self Care | Payer: Managed Care, Other (non HMO) | Attending: Emergency Medicine | Admitting: Emergency Medicine

## 2017-10-16 ENCOUNTER — Other Ambulatory Visit: Payer: Self-pay

## 2017-10-16 ENCOUNTER — Encounter (HOSPITAL_COMMUNITY): Payer: Self-pay | Admitting: Emergency Medicine

## 2017-10-16 DIAGNOSIS — Z7982 Long term (current) use of aspirin: Secondary | ICD-10-CM | POA: Insufficient documentation

## 2017-10-16 DIAGNOSIS — M79601 Pain in right arm: Secondary | ICD-10-CM | POA: Insufficient documentation

## 2017-10-16 DIAGNOSIS — I120 Hypertensive chronic kidney disease with stage 5 chronic kidney disease or end stage renal disease: Secondary | ICD-10-CM | POA: Insufficient documentation

## 2017-10-16 DIAGNOSIS — N186 End stage renal disease: Secondary | ICD-10-CM | POA: Insufficient documentation

## 2017-10-16 DIAGNOSIS — Z79899 Other long term (current) drug therapy: Secondary | ICD-10-CM | POA: Diagnosis not present

## 2017-10-16 HISTORY — DX: Unspecified kidney failure: N19

## 2017-10-16 MED ORDER — HYDROCODONE-ACETAMINOPHEN 5-325 MG PO TABS: 1.0000 | ORAL_TABLET | ORAL | 0 refills | Status: AC | PRN

## 2017-10-16 NOTE — Consult Note (Addendum)
[name] NOTE   MRN : [id number]  Reason for Consult: right AV fistula pain and swelling s/p first HD nstick Referring Physician: ED  History of Present Illness: 52 y/o male oh HD via right TDC.  The first attempt to use his right was last Wednesday.  He has had pain and swelling in the mid fistula surrounding the stick site.  Since then they have not accessed it.  He has been applying ice prn for pain.  He denise fever, chills, numbness or loss of motor in his hand.  No symptoms of steal.   Originally he was seen  in March 2019 with new ESRD requiring HD that was caused by HTN and medical non compliance.  He underwent a d right brachiocephalic AVF creation and conversion of temporary IJ catheter to a right IJ 23cm TDC by Dr. Scot Dock.  He was seen back on 07/11/17 with duplex that revealed it was not maturing adequately.  He was subsequently scheduled for a fistulogram and underwent this on 09/18/17 by Dr. Trula Slade.   Findings: The central venous system is widely patent.  There is mild luminal narrowing from the catheter.  The arterial venous anastomosis is patent.  The vein looks somewhat sclerotic at this area.  There is some tortuosity within the vein in the upper arm.  I believe the area seen on ultrasound correlates with an area of a sharp turn within the vein.  Impression:             #1 no central venous stenosis             #2 arterial venous anastomosis is patent however the vein at this area is a little sclerotic.  Consider surgical revision.             #3 area of irregularity in the right upper arm at the level of concern identified by ultrasound was more of a kink.  I elected to treat this with balloon angioplasty using a 5 mm balloon.  There did appear to be better filling across this lesion after intervention.   Past medical history HTN, ESRD, ETOH abuse      No current facility-administered medications for this encounter.    Current  Outpatient Medications  Medication Sig Dispense Refill  . amLODipine (NORVASC) 10 MG tablet Take 1 tablet (10 mg total) by mouth daily. 30 tablet 1  . aspirin EC 81 MG EC tablet Take 1 tablet (81 mg total) by mouth daily. 30 tablet 0  . b complex-vitamin c-folic acid (NEPHRO-VITE) 0.8 MG TABS tablet Take 1 tablet by mouth daily as needed.   3  . carvedilol (COREG) 12.5 MG tablet Take 2 tablets (25 mg total) by mouth 2 (two) times daily with a meal. 120 tablet 2  . hydrALAZINE (APRESOLINE) 25 MG tablet Take 2 tablets (50 mg total) by mouth 3 (three) times daily. (Patient taking differently: Take 50 mg by mouth 2 (two) times daily. )    . SENSIPAR 90 MG tablet Take 90 mg by mouth every evening.     . sevelamer carbonate (RENVELA) 800 MG tablet Take 2 tablets (1,600 mg total) by mouth 3 (three) times daily with meals. (Patient taking differently: Take 800-1,600 mg by mouth See admin instructions. Take 1600 mg by mouth 3 times daily with meals and take 800 mg by mouth with snacks) 90 tablet 2  . simvastatin (ZOCOR) 20 MG tablet Take 20 mg by mouth every evening.     Marland Kitchen  Blood Pressure Monitoring (BLOOD PRESSURE CUFF) MISC Use blood pressure cuff to measure your blood pressure at the same time each day. Record each blood pressure and bring to your primary care physician. (Patient not taking: Reported on 09/13/2017) 1 each 0    Pt meds include: Statin :Yes Betablocker: Yes ASA: Yes Other anticoagulants/antiplatelets: none  Past Medical History:  Diagnosis Date  . Descending aortic aneurysm ([place]) 2010   repaired by Dr. Gilford Raid  . ETOH abuse   . 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 Hypertension   . Renal failure   . Thyroid nodule     Past Surgical History:  Procedure Laterality Date  . A/V FISTULAGRAM N/A 09/18/2017   Procedure: A/V FISTULAGRAM - Right Arm;  Surgeon: Serafina Mitchell, MD;  Location: [facility];  Service: Cardiovascular;  Laterality: N/A;  . AV FISTULA PLACEMENT  Right 05/24/2017   Procedure: ARTERIOVENOUS (AV) FISTULA CREATION;  Surgeon: Angelia Mould, MD;  Location: [facility];  Service: Vascular;  Laterality: Right;  . CARDIAC SURGERY     pt unsure of actual Sx - knows it was due to "anurysm"  . INSERTION OF DIALYSIS CATHETER N/A 05/24/2017   Procedure: INSERTION OF DIALYSIS CATHETER;  Surgeon: Angelia Mould, MD;  Location: [facility];  Service: Vascular;  Laterality: N/A;  . IR FLUORO GUIDE CV LINE RIGHT  05/22/2017  . IR US GUIDE VASC ACCESS RIGHT  05/22/2017  . PERIPHERAL VASCULAR BALLOON ANGIOPLASTY Right 09/18/2017   Procedure: PERIPHERAL VASCULAR BALLOON ANGIOPLASTY;  Surgeon: Serafina Mitchell, MD;  Location: [facility];  Service: Cardiovascular;  Laterality: Right;  fistulagram  . THROAT SURGERY      Social History Social History   Tobacco Use  . Smoking status: Never Smoker  . Smokeless tobacco: Never Used  Substance Use Topics  . Alcohol use: Yes    Alcohol/week: 3.0 oz    Types: 5 Cans of beer per week    Comment: previousy reported 35 beers a week, but now maybe 1-2 bottles on weekend days  . Drug use: No    Family History Family History  Problem Relation Age of Onset  . Diabetes Mother   . Hypertension Father   . Diabetes Sister     No Known Allergies   REVIEW OF SYSTEMS  General: [ ]  Weight loss, [ ]  Fever, [ ]  chills Neurologic: [ ]  Dizziness, [ ]  Blackouts, [ ]  Seizure [ ]  Stroke, [ ]  "Mini stroke", [ ]  Slurred speech, [ ]  Temporary blindness; [ ]  weakness in arms or legs, [ ]  Hoarseness [ ]  Dysphagia Cardiac: [ ]  Chest pain/pressure, [ ]  Shortness of breath at rest [ ]  Shortness of breath with exertion, [ ]  Atrial fibrillation or irregular heartbeat  Vascular: [ ]  Pain in legs with walking, [ ]  Pain in legs at rest, [ ]  Pain in legs at night,  [ ]  Non-healing ulcer, [ ]  Blood clot in vein/DVT,   Pulmonary: [ ]  Home oxygen, [ ]  Productive cough, [ ]  Coughing up blood, [ ]  Asthma,  [ ]  Wheezing [ ]   COPD Musculoskeletal:  [ ]  Arthritis, [ ]  Low back pain, [ ]  Joint pain Hematologic: [ ]  Easy Bruising, [ ]  Anemia; [ ]  Hepatitis Gastrointestinal: [ ]  Blood in stool, [ ]  Gastroesophageal Reflux/heartburn, Urinary: [ ]  chronic Kidney disease, [x ] on HD - [x ] MWF or [ ]  TTHS, [ ]  Burning with urination, [ ]  Difficulty urinating Skin: [ ]   Rashes, [ ]  Wounds Psychological: [ ]  Anxiety, [ ]  Depression  Physical Examination Vitals:   10/16/17 0926 10/16/17 1130 10/16/17 1200 10/16/17 1230  BP:  (!) 164/100 (!) 168/94 (!) 171/95  Pulse:  64 67 67  Resp:    16  Temp:      TempSrc:      SpO2:  100% 100% 100%  Weight: 180 lb (81.6 kg)     Height: 5\' 10"  (1.778 m)      Body mass index is 25.83 kg/m.  General:  WDWN in NAD HENT: WNL, normocephalic Eyes: Pupils equal Pulmonary: normal non-labored breathing , without Rales, rhonchi,  wheezing Cardiac: RRR, without  Murmurs, rubs or gallops; No carotid bruits Abdomen: soft, NT, no masses Skin: no rashes, ulcers noted;  no Gangrene , no cellulitis; no open wounds;   Vascular Exam/Pulses:palpab le radial pulses B UE, palpable thrill in fistula.  Central skin are over fistula erythema, warm to touch and tender to palpation. Right hand sensation and motor intact.  Musculoskeletal: no muscle wasting or atrophy; Neurologic: A&O X 3; Appropriate Affect ;  SENSATION: normal; MOTOR FUNCTION: 5/5 Symmetric Speech is fluent/normal   Significant Diagnostic Studies: CBC Lab Results  Component Value Date   WBC 10.3 05/25/2017   HGB 13.3 09/18/2017   HCT 39.0 09/18/2017   MCV 88.9 05/25/2017   PLT 131 (L) 05/25/2017    BMET    Component Value Date/Time   NA 136 09/18/2017 1008   K 3.7 09/18/2017 1008   CL 96 (L) 09/18/2017 1008   CO2 26 05/25/2017 1430   GLUCOSE 86 09/18/2017 1008   BUN 39 (H) 09/18/2017 1008   CREATININE 10.30 (H) 09/18/2017 1008   CALCIUM 8.6 (L) 05/25/2017 1430   GFRNONAA 6 (L) 05/25/2017 1430   GFRAA 7 (L)  05/25/2017 1430   CrCl cannot be calculated (Patient's most recent lab result is older than the maximum 21 days allowed.).  COAG Lab Results  Component Value Date   INR 1.6 (H) 11/12/2008   INR 1.0 11/10/2008   ASSESSMENT/PLAN:  ESRD on HD working fistula s/p back wall stick with hematoma.  His arm swelling has not expanded over the last week per the patient.  He has full motor and sensation in the right UE.    He has a working [facility] on the right.  WBC WNL and he is afebrile.  I recommend resting the fistula for 2-3 weeks, then they can try sticking it again.  The fistulogram revealed an area of tortuosity midway.  If it is not usable in a few weeks he may need additional surgery possible superficialization of the central area.  F/U in 3 weeks with our office.  Roxy Horseman 10/16/2017 1:01 PM  I have interviewed the patient and examined the patient. I agree with the findings by the PA. Based on his exam, it looks like he had an infiltrate of his right upper arm fistula.  This will be need to be rested for several weeks.  We will have him follow-up in the office with a duplex in 3 weeks to determine how soon the fistula can be accessed.  Patient has a functioning catheter.  Gae Gallop, MD [phone]

## 2017-10-16 NOTE — ED Triage Notes (Signed)
Pt. Stated, I have a new graft put into rt. Upper arm and its painful and still not useable. Its been very painful unable to sleep

## 2017-10-16 NOTE — ED Provider Notes (Signed)
[facility] EMERGENCY DEPARTMENT Provider Note   CSN: [id number] Arrival date & time: 10/16/17  0911     History   Chief Complaint Chief Complaint  Patient presents with  . Vascular Access Problem    HPI FOUNT BAHE is a 52 y.o. male.  The history is provided by the patient and medical records.  Arm Injury   This is a new problem. The current episode started more than 1 week ago. The problem occurs constantly. The problem has been gradually worsening. The pain is present in the right elbow. The quality of the pain is described as aching. The pain is moderate. Pertinent negatives include no numbness, full range of motion, no stiffness and no tingling. He has tried nothing for the symptoms. The treatment provided no relief. History of extremity trauma: AVF was stuck last week.    Past Medical History:  Diagnosis Date  . 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 Gilford Raid  . ETOH abuse   . 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 Hypertension   . Renal failure   . Thyroid nodule     Patient Active Problem List   Diagnosis Date Noted  . Lobar pneumonia ([place]) 05/24/2017  . Benign essential HTN 05/24/2017  . ESRD (end stage renal disease) ([place]) 05/21/2017  . Anemia 09/27/2016  . Thrombocytopenia ([place]) 09/27/2016  . Hypertensive emergency 06/17/2016  . Hypokalemia 06/17/2016  . Elevated transaminase level 06/17/2016  . Elevated troponin 06/17/2016  . Dyspnea 06/17/2016  . Hypertension   . ETOH abuse   . Hyperparathyroidism, primary ([place]) 05/15/2012  . Thyroid nodule, uninodular, left lobe 05/15/2012  . Descending aortic aneurysm ([place]) 03/20/2008    Past Surgical History:  Procedure Laterality Date  . A/V FISTULAGRAM N/A 09/18/2017   Procedure: A/V FISTULAGRAM - Right Arm;  Surgeon: Serafina Mitchell, MD;  Location: [facility];  Service: Cardiovascular;  Laterality: N/A;  . AV FISTULA PLACEMENT Right 05/24/2017   Procedure: ARTERIOVENOUS (AV) FISTULA CREATION;  Surgeon: Angelia Mould, MD;  Location: [facility];  Service: Vascular;  Laterality: Right;  . CARDIAC SURGERY     pt unsure of actual Sx - knows it was due to "anurysm"  . INSERTION OF DIALYSIS CATHETER N/A 05/24/2017   Procedure: INSERTION OF DIALYSIS CATHETER;  Surgeon: Angelia Mould, MD;  Location: [facility];  Service: Vascular;  Laterality: N/A;  . IR FLUORO GUIDE CV LINE RIGHT  05/22/2017  . IR US GUIDE VASC ACCESS RIGHT  05/22/2017  . PERIPHERAL VASCULAR BALLOON ANGIOPLASTY Right 09/18/2017   Procedure: PERIPHERAL VASCULAR BALLOON ANGIOPLASTY;  Surgeon: Serafina Mitchell, MD;  Location: [facility];  Service: Cardiovascular;  Laterality: Right;  fistulagram  . THROAT SURGERY          Home Medications    Prior to Admission medications   Medication Sig Start Date End Date Taking? Authorizing Provider  amLODipine (NORVASC) 10 MG tablet Take 1 tablet (10 mg total) by mouth daily. 09/30/16   Bonnielee Haff, MD  aspirin EC 81 MG EC tablet Take 1 tablet (81 mg total) by mouth daily. 10/01/16   Bonnielee Haff, MD  b complex-vitamin c-folic acid (NEPHRO-VITE) 0.8 MG TABS tablet Take 1 tablet by mouth daily. 05/31/17   [provider]  Blood Pressure Monitoring (BLOOD PRESSURE CUFF) MISC Use blood pressure cuff to measure your blood pressure at the same time each day. Record each blood pressure and bring to your primary  care physician. Patient not taking: Reported on 09/13/2017 06/20/16   Dessa Phi, DO  carvedilol (COREG) 12.5 MG tablet Take 2 tablets (25 mg total) by mouth 2 (two) times daily with a meal. 09/30/16   Bonnielee Haff, MD  hydrALAZINE (APRESOLINE) 25 MG tablet Take 2 tablets (50 mg total) by mouth 3 (three) times daily. Patient taking differently: Take 50 mg by mouth 2 (two) times daily.  05/25/17   Domenic Polite, MD  SENSIPAR 90 MG tablet Take 90 mg by mouth every evening.  08/14/17   [provider]    sevelamer carbonate (RENVELA) 800 MG tablet Take 2 tablets (1,600 mg total) by mouth 3 (three) times daily with meals. Patient taking differently: Take 800-1,600 mg by mouth See admin instructions. Take 1600 mg by mouth 3 times daily with meals and take 800 mg by mouth with snacks 05/25/17   Domenic Polite, MD  simvastatin (ZOCOR) 20 MG tablet Take 20 mg by mouth every evening.     [provider]    Family History Family History  Problem Relation Age of Onset  . Diabetes Mother   . Hypertension Father   . Diabetes Sister     Social History Social History   Tobacco Use  . Smoking status: Never Smoker  . Smokeless tobacco: Never Used  Substance Use Topics  . Alcohol use: Yes    Alcohol/week: 3.0 oz    Types: 5 Cans of beer per week    Comment: previousy reported 35 beers a week, but now maybe 1-2 bottles on weekend days  . Drug use: No     Allergies   Patient has no known allergies.   Review of Systems Review of Systems  Constitutional: Negative for activity change, chills, diaphoresis, fatigue and fever.  HENT: Negative for congestion and rhinorrhea.   Eyes: Negative for visual disturbance.  Respiratory: Negative for cough, chest tightness, shortness of breath, wheezing and stridor.   Cardiovascular: Negative for chest pain, palpitations and leg swelling.  Gastrointestinal: Negative for abdominal distention, abdominal pain, blood in stool, constipation, diarrhea, nausea and vomiting.  Genitourinary: Negative for difficulty urinating, dysuria, flank pain and frequency.  Musculoskeletal: Negative for back pain, gait problem and stiffness. Neck pain: bruising on R arm.  Skin: Positive for color change. Negative for rash and wound.  Neurological: Negative for dizziness, tingling, weakness, light-headedness, numbness and headaches.  Psychiatric/Behavioral: Negative for agitation.  All other systems reviewed and are negative.    Physical Exam Updated Vital  Signs BP (!) 164/100   Pulse 64   Temp 98.4 F (36.9 C) (Oral)   Resp 20   Ht 5\' 10"  (1.778 m)   Wt 81.6 kg (180 lb)   SpO2 100%   BMI 25.83 kg/m   Physical Exam  Constitutional: He is oriented to person, place, and time. He appears well-developed and well-nourished. No distress.  HENT:  Head: Normocephalic.  Nose: Nose normal.  Mouth/Throat: Oropharynx is clear and moist. No oropharyngeal exudate.  Eyes: Pupils are equal, round, and reactive to light. Conjunctivae and EOM are normal.  Neck: Normal range of motion.  Cardiovascular: Normal rate and intact distal pulses.  No murmur heard. Pulmonary/Chest: Effort normal and breath sounds normal. No respiratory distress. He has no wheezes. He exhibits no tenderness.  Abdominal: Soft. He exhibits no distension. There is no tenderness.  Musculoskeletal: He exhibits edema and tenderness. He exhibits no deformity.       Right elbow: He exhibits swelling. He exhibits no laceration.  Tenderness found.       Arms: Neurological: He is alert and oriented to person, place, and time. No cranial nerve deficit or sensory deficit. He exhibits normal muscle tone.  Skin: Skin is warm. He is not diaphoretic. No erythema. No pallor.  Psychiatric: He has a normal mood and affect.  Nursing note and vitals reviewed.    ED Treatments / Results  Labs (all labs ordered are listed, but only abnormal results are displayed) Labs Reviewed - No data to display  EKG None  Radiology No results found.  Procedures Procedures (including critical care time)  Medications Ordered in ED Medications - No data to display   Initial Impression / Assessment and Plan / ED Course  I have reviewed the triage vital signs and the nursing notes.  Pertinent labs & imaging results that were available during my care of the patient were reviewed by me and considered in my medical decision making (see chart for details).     Colin [name] is a 52 y.o. male  with a past medical history significant for prior descending AAA status post repair, ESRD on dialysis M/W/F, right arm AV fistula placed on 3/7 status post angioplasty on 7/2 who presents with worsening right arm pain and swelling.  Patient reports that he has been taking dialysis and had an AV fistula placed back in March.  He reports that he was seen again because it was not maturing.  He reports that several weeks ago on 7/2 he had an angioplasty performed on it.  He said that he has not had any complications with it and on reassessment on 7/22, the vascular team felt he was safe for cannulization and dialysis use.  He reports that last Wednesday, on 7/24, he attempted cannulization and dialysis.  He reports that it did not work properly and so they stopped it.  He says that over the weekend he had no comp occasions or problems but overnight last night he reports it began to swell, and more pain develop.  He reports it is now a 9 out of 10 in severity.  He denies numbness, tingling, or weakness in the hand.  He reports that his distal upper arm is ecchymotic and swollen.  He denies fevers, chills, chest pain, shortness of breath, nausea, vomiting, or other signs of systemic infection.  He denies any urinary symptoms as he still make some urine.  He denies conservation or diarrhea.  On exam, patient has a palpable radial pulse on the right arm.  He has a palpable thrill on the fistula.  The distal upper arm is bruised appearing and warm to the touch.  There is tenderness to palpation with swelling.  Patient has symmetric grip strength bilaterally.  Lungs clear and chest nontender.  Patient has a dialysis catheter in his right upper chest.    Clinically I am concerned about either complication with the fistula with the recent cannulization on the tortuous and recently angioplastied fistula versus infection.   Will speak with vascular surgery to determine what imaging would be most helpful to further  evaluate.  2:58 PM Vascular surgery came to see the patient and feel this is not infection or a clot.  They suspect this is a hematoma after he had venipuncture during his dialysis last week.  On the reassessment, patient reports that it has been swollen since the puncture but only started hurting today.  We do not feel there is compartment syndrome.  The vascular surgery team feels  he is safe for discharge home with pain medicine, icing, and will follow-up with him in clinic.  They do not feel he has an infection.  Patient agreed with plan of care and understood return precautions.  Patient had no questions or concerns and was discharged in good condition.  Final Clinical Impressions(s) / ED Diagnoses   Final diagnoses:  Right arm pain    ED Discharge Orders        Ordered    HYDROcodone-acetaminophen (NORCO/VICODIN) 5-325 MG tablet  Every 4 hours PRN     10/16/17 1459      Clinical Impression: 1. Right arm pain     Disposition: Discharge  Condition: Good  I have discussed the results, Dx and Tx plan with the pt(& family if present). He/she/they expressed understanding and agree(s) with the plan. Discharge instructions discussed at great length. Strict return precautions discussed and pt &/or family have verbalized understanding of the instructions. No further questions at time of discharge.    New Prescriptions   HYDROCODONE-ACETAMINOPHEN (NORCO/VICODIN) 5-325 MG TABLET    Take 1 tablet by mouth every 4 (four) hours as needed.    Follow Up: Angelia Mould, [place] [address] [phone]  Follow up in 3 week(s) office will call  [facility] [address] [place]       Tegeler, Gwenyth Allegra, MD 10/16/17 1536

## 2017-10-16 NOTE — ED Notes (Signed)
Pt given something to drink per Dr. Tegeler 

## 2017-10-16 NOTE — Discharge Instructions (Addendum)
Your evaluation by vascular surgery team led him to feel you are safe for discharge home.  We doubt infection at this time.  Please follow-up with the vascular surgery team as the recommend and if any symptoms change or worsen, please return to the nearest emergency department.

## 2017-10-17 ENCOUNTER — Other Ambulatory Visit: Payer: Self-pay

## 2017-10-17 ENCOUNTER — Telehealth: Payer: Self-pay | Admitting: Surgery

## 2017-10-17 DIAGNOSIS — N186 End stage renal disease: Secondary | ICD-10-CM

## 2017-10-17 NOTE — Telephone Encounter (Signed)
sch appt [facility]vm 11/06/17 2pm Dialysis Duplex 3pm f/u PA

## 2017-11-06 ENCOUNTER — Other Ambulatory Visit: Payer: Self-pay

## 2017-11-06 ENCOUNTER — Ambulatory Visit (HOSPITAL_COMMUNITY)
Admission: RE | Admit: 2017-11-06 | Discharge: 2017-11-06 | Disposition: A | Discharge status: Home / Self Care | Payer: Managed Care, Other (non HMO) | Source: Ambulatory Visit | Attending: Surgery | Admitting: Surgery

## 2017-11-06 ENCOUNTER — Ambulatory Visit (INDEPENDENT_AMBULATORY_CARE_PROVIDER_SITE_OTHER): Payer: Managed Care, Other (non HMO) | Admitting: Physician Assistant

## 2017-11-06 VITALS — BP 123/72 | HR 74 | Temp 98.6°F | Resp 16 | Ht 70.0 in | Wt 185.0 lb

## 2017-11-06 DIAGNOSIS — N186 End stage renal disease: Secondary | ICD-10-CM

## 2017-11-06 NOTE — Progress Notes (Signed)
Established Dialysis Access   History of Present Illness   Colin [name] is a 52 y.o. ([birth year]) male who presents for re-evaluation for permanent access.  He is s/p right brachiocephalic fistula by Dr. Scot Dock on 05/24/2017.  Right arm fistula has been slow to mature and thus patient underwent right arm fistulogram with balloon angioplasty of a tortuous and somewhat kinked segment in mid upper arm by Dr. Trula Slade on 09/18/2017.  Then at the patient's next scheduled hemodialysis treatment fistula was infiltrated.  He presented to the emergency department with painful right arm and swelling.  We recommended 3 weeks of resting fistula and to follow-up in office with a fistula duplex.   Patient states the hematoma has completely resolved.  He denies any steal symptoms in right hand.   He is dialyzing on a Monday Wednesday Friday schedule via right IJ tunneled dialysis catheter under the care of Dr. Lorrene Reid had a kidney center here in [place].    The patient's PMH, PSH, SH, and FamHx were reviewed and are unchanged from prior visit.  Current Outpatient Medications  Medication Sig Dispense Refill  . amLODipine (NORVASC) 10 MG tablet Take 1 tablet (10 mg total) by mouth daily. 30 tablet 1  . aspirin EC 81 MG EC tablet Take 1 tablet (81 mg total) by mouth daily. 30 tablet 0  . b complex-vitamin c-folic acid (NEPHRO-VITE) 0.8 MG TABS tablet Take 1 tablet by mouth daily as needed.   3  . Blood Pressure Monitoring (BLOOD PRESSURE CUFF) MISC Use blood pressure cuff to measure your blood pressure at the same time each day. Record each blood pressure and bring to your primary care physician. 1 each 0  . carvedilol (COREG) 12.5 MG tablet Take 2 tablets (25 mg total) by mouth 2 (two) times daily with a meal. 120 tablet 2  . hydrALAZINE (APRESOLINE) 25 MG tablet Take 2 tablets (50 mg total) by mouth 3 (three) times daily. (Patient taking differently: Take 50 mg by mouth 2 (two) times daily. )    .  SENSIPAR 90 MG tablet Take 90 mg by mouth every evening.     . sevelamer carbonate (RENVELA) 800 MG tablet Take 2 tablets (1,600 mg total) by mouth 3 (three) times daily with meals. (Patient taking differently: Take 800-1,600 mg by mouth See admin instructions. Take 1600 mg by mouth 3 times daily with meals and take 800 mg by mouth with snacks) 90 tablet 2  . simvastatin (ZOCOR) 20 MG tablet Take 20 mg by mouth every evening.     Marland Kitchen HYDROcodone-acetaminophen (NORCO/VICODIN) 5-325 MG tablet Take 1 tablet by mouth every 4 (four) hours as needed. (Patient not taking: Reported on 11/06/2017) 10 tablet 0   No current facility-administered medications for this visit.     On ROS today: 10 system ROS is negative unless otherwise noted in HPI   Physical Examination   Vitals:   11/06/17 1452  BP: 123/72  Pulse: 74  Resp: 16  Temp: 98.6 F (37 C)  TempSrc: Oral  SpO2: 96%  Weight: 185 lb (83.9 kg)  Height: 5\' 10"  (1.778 m)   Body mass index is 26.54 kg/m.  General Alert, O x 3, WD,   Pulmonary Sym exp, good B air movt,   Cardiac RRR, Nl S1, S2,   Vascular Vessel Right Left  Radial Palpable Palpable  Brachial Palpable thrill in distal and mid upper arm which becomes more pulsatile as you approach axilla; audible bruit throughout fistula; right  hand grip strength and sensation intact; palpable right radial pulse Not examined  Ulnar Not palpable Not examined    Musculo- skeletal M/S 5/5 throughout  , Extremities without ischemic changes    Neurologic A&O; CN grossly intact     Non-invasive Vascular Imaging   right Arm Access Duplex  (11/06/17):   Diameters: Diameter ranges from 0.48-0.72 cm  Depth: 18-22 mm  PSV: No significant elevated velocities    Medical Decision Making   SADAO WEYER is a 52 y.o. male who presents with ESRD requiring hemodialysis.    Based on physical exam and fistula duplex I am reluctant to recommend revision of arterial anastomosis at this  time.  The fistula has a strong thrill in a large segment of the distal and mid upper arm.  The duplex demonstrates adequate depth and diameter and only mild tortuosity.  Infiltration hematoma has completely resolved and I believe right arm brachial cephalic fistula is ready for another attempt at cannulation.  If the dialysis center continues to have trouble cannulating fistula patient will follow up with Dr. Trula Slade to schedule arterial anastomosis revision.  If right arm AV fistula performs well, the patient's right IJ tunneled dialysis catheter can be removed when it is okay with nephrology.  He may follow-up on an as-needed basis.   Dagoberto Ligas PA-C [facility] Office: [phone]

## 2018-01-29 ENCOUNTER — Encounter: Payer: Self-pay | Admitting: Gastroenterology

## 2018-02-11 ENCOUNTER — Emergency Department (HOSPITAL_COMMUNITY)
Admission: EM | Admit: 2018-02-11 | Discharge: 2018-02-11 | Disposition: A | Discharge status: Home / Self Care | Payer: Managed Care, Other (non HMO) | Attending: Emergency Medicine | Admitting: Emergency Medicine

## 2018-02-11 ENCOUNTER — Encounter (HOSPITAL_COMMUNITY): Payer: Self-pay | Admitting: Emergency Medicine

## 2018-02-11 DIAGNOSIS — L02419 Cutaneous abscess of limb, unspecified: Secondary | ICD-10-CM

## 2018-02-11 DIAGNOSIS — N186 End stage renal disease: Secondary | ICD-10-CM | POA: Diagnosis not present

## 2018-02-11 DIAGNOSIS — L989 Disorder of the skin and subcutaneous tissue, unspecified: Secondary | ICD-10-CM | POA: Diagnosis present

## 2018-02-11 DIAGNOSIS — I120 Hypertensive chronic kidney disease with stage 5 chronic kidney disease or end stage renal disease: Secondary | ICD-10-CM | POA: Insufficient documentation

## 2018-02-11 DIAGNOSIS — Z7982 Long term (current) use of aspirin: Secondary | ICD-10-CM | POA: Insufficient documentation

## 2018-02-11 DIAGNOSIS — Z79899 Other long term (current) drug therapy: Secondary | ICD-10-CM | POA: Diagnosis not present

## 2018-02-11 DIAGNOSIS — L02412 Cutaneous abscess of left axilla: Secondary | ICD-10-CM | POA: Insufficient documentation

## 2018-02-11 MED ORDER — DOXYCYCLINE HYCLATE 100 MG PO CAPS: 100.0000 mg | ORAL_CAPSULE | Freq: Two times a day (BID) | ORAL | 0 refills | Status: AC

## 2018-02-11 MED ORDER — HYDROCODONE-ACETAMINOPHEN 5-325 MG PO TABS
1.0000 | ORAL_TABLET | Freq: Once | ORAL | Status: AC
  Administered 2018-02-11: 1 via ORAL
  Filled 2018-02-11: qty 1

## 2018-02-11 MED ORDER — LIDOCAINE-EPINEPHRINE (PF) 2 %-1:200000 IJ SOLN
10.0000 mL | Freq: Once | INTRAMUSCULAR | Status: AC
  Administered 2018-02-11: 10 mL
  Filled 2018-02-11: qty 10

## 2018-02-11 NOTE — Discharge Instructions (Signed)
Take antibiotics as directed.  Keep area covered with dressing as it will continue to drain over the next few days.  Use warm soaks in the shower warm compresses 2-3 times daily.  Please follow-up with your primary care doctor in the next 2 to 3 days for a wound check.  Monitor for signs of worsening infection such as increasing redness, swelling, increasing pain, increasing amounts of drainage, fevers or any other new or concerning symptoms.  If these occur return to the emergency department for reevaluation.

## 2018-02-11 NOTE — ED Triage Notes (Signed)
Pt states he has a boil under his left armpit that he noticed on Wednesday. Denies any drainage.

## 2018-02-11 NOTE — ED Provider Notes (Signed)
[facility] EMERGENCY DEPARTMENT Provider Note   CSN: [id number] Arrival date & time: 02/11/18  1208     History   Chief Complaint Chief Complaint  Patient presents with  . Recurrent Skin Infections    HPI Colin [name] is a 52 y.o. male.  Colin [name] is a 52 y.o. 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  He reports it has become increasingly painful and it has increased in size over the past few days.  It became so uncomfortable yesterday that he did not complete dialysis, and reports last night he was in tears.  He has not taken anything for pain or tried anything to treat this.  No prior history of abscesses.  He has not had any fevers or chills, no nausea or vomiting.  Patient's dialysis port is in the other arm and he has had no redness or swelling surrounding this no pain in that arm or axilla.     Past Medical History:  Diagnosis Date  . Descending aortic aneurysm ([place]) 2010   repaired by Dr. Gilford Raid  . ETOH abuse   . 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 Hypertension   . Renal failure   . Thyroid nodule     Patient Active Problem List   Diagnosis Date Noted  . Lobar pneumonia ([place]) 05/24/2017  . Benign essential HTN 05/24/2017  . ESRD (end stage renal disease) ([place]) 05/21/2017  . Anemia 09/27/2016  . Thrombocytopenia ([place]) 09/27/2016  . Hypertensive emergency 06/17/2016  . Hypokalemia 06/17/2016  . Elevated transaminase level 06/17/2016  . Elevated troponin 06/17/2016  . Dyspnea 06/17/2016  . Hypertension   . ETOH abuse   . Hyperparathyroidism, primary ([place]) 05/15/2012  . Thyroid nodule, uninodular, left lobe 05/15/2012  . Descending aortic aneurysm ([place]) 03/20/2008    Past Surgical History:    Procedure Laterality Date  . A/V FISTULAGRAM N/A 09/18/2017   Procedure: A/V FISTULAGRAM - Right Arm;  Surgeon: Serafina Mitchell, MD;  Location: [facility];  Service: Cardiovascular;  Laterality: N/A;  . AV FISTULA PLACEMENT Right 05/24/2017   Procedure: ARTERIOVENOUS (AV) FISTULA CREATION;  Surgeon: Angelia Mould, MD;  Location: [facility];  Service: Vascular;  Laterality: Right;  . CARDIAC SURGERY     pt unsure of actual Sx - knows it was due to "anurysm"  . INSERTION OF DIALYSIS CATHETER N/A 05/24/2017   Procedure: INSERTION OF DIALYSIS CATHETER;  Surgeon: Angelia Mould, MD;  Location: [facility];  Service: Vascular;  Laterality: N/A;  . IR FLUORO GUIDE CV LINE RIGHT  05/22/2017  . IR US GUIDE VASC ACCESS RIGHT  05/22/2017  . PERIPHERAL VASCULAR BALLOON ANGIOPLASTY Right 09/18/2017   Procedure: PERIPHERAL VASCULAR BALLOON ANGIOPLASTY;  Surgeon: Serafina Mitchell, MD;  Location: [facility];  Service: Cardiovascular;  Laterality: Right;  fistulagram  . THROAT SURGERY          Home Medications    Prior to Admission medications   Medication Sig Start Date End Date Taking? Authorizing Provider  amLODipine (NORVASC) 10 MG tablet Take 1 tablet (10 mg total) by mouth daily. 09/30/16   Bonnielee Haff, MD  aspirin EC 81 MG EC tablet Take 1 tablet (81 mg total) by mouth daily. 10/01/16   Bonnielee Haff, MD  b  complex-vitamin c-folic acid (NEPHRO-VITE) 0.8 MG TABS tablet Take 1 tablet by mouth daily as needed.  05/31/17   [provider]  Blood Pressure Monitoring (BLOOD PRESSURE CUFF) MISC Use blood pressure cuff to measure your blood pressure at the same time each day. Record each blood pressure and bring to your primary care physician. 06/20/16   Dessa Phi, DO  carvedilol (COREG) 12.5 MG tablet Take 2 tablets (25 mg total) by mouth 2 (two) times daily with a meal. 09/30/16   Bonnielee Haff, MD  hydrALAZINE (APRESOLINE) 25 MG tablet Take 2 tablets (50 mg total) by mouth 3  (three) times daily. Patient taking differently: Take 50 mg by mouth 2 (two) times daily.  05/25/17   Domenic Polite, MD  HYDROcodone-acetaminophen (NORCO/VICODIN) 5-325 MG tablet Take 1 tablet by mouth every 4 (four) hours as needed. Patient not taking: Reported on 11/06/2017 10/16/17   Tegeler, Gwenyth Allegra, MD  SENSIPAR 90 MG tablet Take 90 mg by mouth every evening.  08/14/17   [provider]  sevelamer carbonate (RENVELA) 800 MG tablet Take 2 tablets (1,600 mg total) by mouth 3 (three) times daily with meals. Patient taking differently: Take 800-1,600 mg by mouth See admin instructions. Take 1600 mg by mouth 3 times daily with meals and take 800 mg by mouth with snacks 05/25/17   Domenic Polite, MD  simvastatin (ZOCOR) 20 MG tablet Take 20 mg by mouth every evening.     [provider]    Family History Family History  Problem Relation Age of Onset  . Diabetes Mother   . Hypertension Father   . Diabetes Sister     Social History Social History   Tobacco Use  . Smoking status: Never Smoker  . Smokeless tobacco: Never Used  Substance Use Topics  . Alcohol use: Yes    Alcohol/week: 5.0 standard drinks    Types: 5 Cans of beer per week    Comment: previousy reported 35 beers a week, but now maybe 1-2 bottles on weekend days  . Drug use: No     Allergies   Patient has no known allergies.   Review of Systems Review of Systems  Constitutional: Negative for chills and fever.  Gastrointestinal: Negative for nausea and vomiting.  Skin: Positive for color change.       Abscess     Physical Exam Updated Vital Signs BP (!) 160/89 (BP Location: Left Arm)   Pulse 91   Temp 98.4 F (36.9 C) (Oral)   Resp 18   SpO2 100%   Physical Exam  Constitutional: He appears well-developed and well-nourished. No distress.  HENT:  Head: Normocephalic and atraumatic.  Eyes: Right eye exhibits no discharge. Left eye exhibits no discharge.  Pulmonary/Chest: Effort  normal. No respiratory distress.  Musculoskeletal:  2 x 4 cm area of fluctuance noted in the left axilla with surrounding erythema and induration it is warm to the touch.  No associated lymphangitic streaking.  No other abscesses or areas of fluctuance noted.   Neurological: He is alert. Coordination normal.  Skin: Skin is warm and dry. Capillary refill takes less than 2 seconds. He is not diaphoretic.  Psychiatric: He has a normal mood and affect. His behavior is normal.  Nursing note and vitals reviewed.    ED Treatments / Results  Labs (all labs ordered are listed, but only abnormal results are displayed) Labs Reviewed - No data to display  EKG None  Radiology No results found.  Procedures .Marland KitchenIncision and  Drainage Date/Time: 02/11/2018 1:14 PM Performed by: Jacqlyn Larsen, PA-C Authorized by: Jacqlyn Larsen, PA-C   Consent:    Consent obtained:  Verbal   Consent given by:  Patient   Risks discussed:  Bleeding, damage to other organs, infection, incomplete drainage and pain   Alternatives discussed:  No treatment Location:    Type:  Abscess   Size:  2 x 4 cm   Location:  Upper extremity   Upper extremity location: Left axilla. Pre-procedure details:    Skin preparation:  Chloraprep Anesthesia (see MAR for exact dosages):    Anesthesia method:  Local infiltration   Local anesthetic:  Lidocaine 2% WITH epi Procedure type:    Complexity:  Simple Procedure details:    Incision types:  Single straight   Incision depth:  Dermal   Scalpel blade:  11   Wound management:  Probed and deloculated and irrigated with saline   Drainage:  Bloody and purulent   Drainage amount:  Moderate   Wound treatment:  Wound left open   Packing materials:  None Post-procedure details:    Patient tolerance of procedure:  Tolerated well, no immediate complications   (including critical care time)  Medications Ordered in ED Medications  HYDROcodone-acetaminophen (NORCO/VICODIN) 5-325 MG  per tablet 1 tablet (has no administration in time range)  lidocaine-EPINEPHrine (XYLOCAINE W/EPI) 2 %-1:200000 (PF) injection 10 mL (10 mLs Infiltration Given 02/11/18 1230)     Initial Impression / Assessment and Plan / ED Course  I have reviewed the triage vital signs and the nursing notes.  Pertinent labs & imaging results that were available during my care of the patient were reviewed by me and considered in my medical decision making (see chart for details).  Patient with skin abscess amenable to incision and drainage.  Abscess confirmed with bedside ultrasound.  Abscess was not large enough to warrant packing or drain,  wound recheck in 2 days. Encouraged home warm soaks and flushing.  Patient does have surrounding erythema and induration and given that he is a dialysis patient he has a high infection risk, will treat with doxycycline.  Discussed appropriate return precautions and wound care.  Patient expresses understanding and is in agreement with plan.  Stable for discharge home at this time.  Final Clinical Impressions(s) / ED Diagnoses   Final diagnoses:  Axillary abscess    ED Discharge Orders         Ordered    doxycycline (VIBRAMYCIN) 100 MG capsule  2 times daily     02/11/18 1253           Jacqlyn Larsen, [place] 02/11/18 1315    Orlie Dakin, MD 02/11/18 [phone]

## 2018-02-11 NOTE — ED Notes (Signed)
Dressed pts incision with gauze

## 2018-02-22 ENCOUNTER — Ambulatory Visit: Payer: Managed Care, Other (non HMO) | Admitting: Gastroenterology

## 2018-05-17 ENCOUNTER — Ambulatory Visit
Admission: RE | Admit: 2018-05-17 | Discharge: 2018-05-17 | Disposition: A | Discharge status: Home / Self Care | Payer: Managed Care, Other (non HMO) | Source: Ambulatory Visit | Attending: Nephrology | Admitting: Nephrology

## 2018-05-17 ENCOUNTER — Other Ambulatory Visit: Payer: Self-pay | Admitting: Nephrology

## 2018-05-17 DIAGNOSIS — R7611 Nonspecific reaction to tuberculin skin test without active tuberculosis: Secondary | ICD-10-CM
